# Patient Record
Sex: Female | Born: 1955
Health system: Southern US, Community
[De-identification: ages and names within clinical notes are randomized; demographics above are authoritative.]

## PROBLEM LIST (undated history)

## (undated) DIAGNOSIS — Z658 Other specified problems related to psychosocial circumstances: Secondary | ICD-10-CM

## (undated) DIAGNOSIS — M1990 Unspecified osteoarthritis, unspecified site: Secondary | ICD-10-CM

## (undated) DIAGNOSIS — Z86018 Personal history of other benign neoplasm: Secondary | ICD-10-CM

## (undated) DIAGNOSIS — F419 Anxiety disorder, unspecified: Secondary | ICD-10-CM

## (undated) DIAGNOSIS — E7800 Pure hypercholesterolemia, unspecified: Secondary | ICD-10-CM

## (undated) DIAGNOSIS — U071 COVID-19: Secondary | ICD-10-CM

## (undated) DIAGNOSIS — E669 Obesity, unspecified: Secondary | ICD-10-CM

## (undated) DIAGNOSIS — IMO0001 Reserved for inherently not codable concepts without codable children: Secondary | ICD-10-CM

## (undated) DIAGNOSIS — R030 Elevated blood-pressure reading, without diagnosis of hypertension: Secondary | ICD-10-CM

## (undated) DIAGNOSIS — G4733 Obstructive sleep apnea (adult) (pediatric): Secondary | ICD-10-CM

## (undated) DIAGNOSIS — R600 Localized edema: Secondary | ICD-10-CM

## (undated) DIAGNOSIS — F329 Major depressive disorder, single episode, unspecified: Secondary | ICD-10-CM

## (undated) DIAGNOSIS — R4589 Other symptoms and signs involving emotional state: Secondary | ICD-10-CM

## (undated) DIAGNOSIS — I10 Essential (primary) hypertension: Secondary | ICD-10-CM

## (undated) DIAGNOSIS — M2550 Pain in unspecified joint: Secondary | ICD-10-CM

## (undated) HISTORY — PX: COLONOSCOPY: SHX174

## (undated) HISTORY — DX: Anxiety disorder, unspecified: F41.9

## (undated) HISTORY — DX: Other symptoms and signs involving emotional state: R45.89

## (undated) HISTORY — DX: Personal history of other benign neoplasm: Z86.018

## (undated) HISTORY — DX: Pure hypercholesterolemia, unspecified: E78.00

## (undated) HISTORY — DX: COVID-19: U07.1

## (undated) HISTORY — PX: BELPHAROPTOSIS REPAIR: SHX369

## (undated) HISTORY — DX: Localized edema: R60.0

## (undated) HISTORY — DX: Major depressive disorder, single episode, unspecified: F32.9

## (undated) HISTORY — DX: Pain in unspecified joint: M25.50

## (undated) HISTORY — DX: Reserved for inherently not codable concepts without codable children: IMO0001

## (undated) HISTORY — DX: Other specified problems related to psychosocial circumstances: Z65.8

## (undated) HISTORY — DX: Obesity, unspecified: E66.9

## (undated) HISTORY — DX: Essential (primary) hypertension: I10

## (undated) HISTORY — DX: Elevated blood-pressure reading, without diagnosis of hypertension: R03.0

## (undated) HISTORY — DX: Obstructive sleep apnea (adult) (pediatric): G47.33

---

## 1999-12-17 ENCOUNTER — Other Ambulatory Visit: Admission: RE | Admit: 1999-12-17 | Discharge: 1999-12-17 | Payer: Self-pay | Admitting: *Deleted

## 2000-04-14 ENCOUNTER — Other Ambulatory Visit: Admission: RE | Admit: 2000-04-14 | Discharge: 2000-04-14 | Payer: Self-pay | Admitting: *Deleted

## 2000-11-29 ENCOUNTER — Other Ambulatory Visit: Admission: RE | Admit: 2000-11-29 | Discharge: 2000-11-29 | Payer: Self-pay | Admitting: *Deleted

## 2001-09-21 DIAGNOSIS — F32A Depression, unspecified: Secondary | ICD-10-CM

## 2001-09-21 HISTORY — DX: Depression, unspecified: F32.A

## 2001-09-28 ENCOUNTER — Other Ambulatory Visit: Admission: RE | Admit: 2001-09-28 | Discharge: 2001-09-28 | Payer: Self-pay | Admitting: *Deleted

## 2002-11-20 ENCOUNTER — Other Ambulatory Visit: Admission: RE | Admit: 2002-11-20 | Discharge: 2002-11-20 | Payer: Self-pay | Admitting: *Deleted

## 2002-11-20 DIAGNOSIS — Z86018 Personal history of other benign neoplasm: Secondary | ICD-10-CM

## 2002-11-20 HISTORY — DX: Personal history of other benign neoplasm: Z86.018

## 2003-11-19 ENCOUNTER — Other Ambulatory Visit: Admission: RE | Admit: 2003-11-19 | Discharge: 2003-11-19 | Payer: Self-pay | Admitting: *Deleted

## 2005-02-04 ENCOUNTER — Other Ambulatory Visit: Admission: RE | Admit: 2005-02-04 | Discharge: 2005-02-04 | Payer: Self-pay | Admitting: *Deleted

## 2006-04-13 ENCOUNTER — Other Ambulatory Visit: Admission: RE | Admit: 2006-04-13 | Discharge: 2006-04-13 | Payer: Self-pay | Admitting: Obstetrics & Gynecology

## 2007-05-06 ENCOUNTER — Other Ambulatory Visit: Admission: RE | Admit: 2007-05-06 | Discharge: 2007-05-06 | Payer: Self-pay | Admitting: Obstetrics & Gynecology

## 2008-05-18 ENCOUNTER — Other Ambulatory Visit: Admission: RE | Admit: 2008-05-18 | Discharge: 2008-05-18 | Payer: Self-pay | Admitting: Obstetrics and Gynecology

## 2009-06-03 ENCOUNTER — Encounter: Admission: RE | Admit: 2009-06-03 | Discharge: 2009-06-03 | Payer: Self-pay | Admitting: Obstetrics & Gynecology

## 2010-10-11 ENCOUNTER — Encounter: Payer: Self-pay | Admitting: Obstetrics & Gynecology

## 2011-09-23 ENCOUNTER — Encounter: Payer: Self-pay | Admitting: Internal Medicine

## 2011-09-29 ENCOUNTER — Other Ambulatory Visit: Payer: Self-pay | Admitting: Obstetrics & Gynecology

## 2011-09-29 DIAGNOSIS — Z1231 Encounter for screening mammogram for malignant neoplasm of breast: Secondary | ICD-10-CM

## 2011-10-13 ENCOUNTER — Ambulatory Visit: Payer: Self-pay

## 2011-10-19 ENCOUNTER — Other Ambulatory Visit: Payer: Self-pay | Admitting: Internal Medicine

## 2012-06-21 ENCOUNTER — Ambulatory Visit: Payer: Self-pay | Admitting: Licensed Clinical Social Worker

## 2012-09-21 DIAGNOSIS — E7800 Pure hypercholesterolemia, unspecified: Secondary | ICD-10-CM

## 2012-09-21 HISTORY — DX: Pure hypercholesterolemia, unspecified: E78.00

## 2013-07-11 ENCOUNTER — Other Ambulatory Visit: Payer: Self-pay | Admitting: *Deleted

## 2013-07-11 NOTE — Telephone Encounter (Signed)
Pt asking for Rx for Phentermine 37.5mg  had her Annual Exam 10/19/12 didn't see that this medication was prescribed. Please advise

## 2013-07-12 NOTE — Telephone Encounter (Signed)
Called Pharmacy [facility]) verbally declining Rx  Phentermine 37.5mg . Pt needs to schedule a consult appt To discuss.

## 2013-07-12 NOTE — Telephone Encounter (Signed)
Hi Carol,  I have not prescribed this medication for the patient.  Rx declined.  Please set her up for a consultation if she would like to discuss this further.  Thanks!

## 2013-11-27 ENCOUNTER — Ambulatory Visit (INDEPENDENT_AMBULATORY_CARE_PROVIDER_SITE_OTHER): Payer: BC Managed Care – PPO | Admitting: Gynecology

## 2013-11-27 ENCOUNTER — Encounter: Payer: Self-pay | Admitting: Gynecology

## 2013-11-27 ENCOUNTER — Encounter: Payer: Self-pay | Admitting: Obstetrics and Gynecology

## 2013-11-27 ENCOUNTER — Telehealth: Payer: Self-pay | Admitting: Obstetrics & Gynecology

## 2013-11-27 VITALS — BP 148/98 | HR 82 | Temp 98.4°F | Resp 18 | Ht 63.5 in | Wt 197.0 lb

## 2013-11-27 DIAGNOSIS — N949 Unspecified condition associated with female genital organs and menstrual cycle: Secondary | ICD-10-CM

## 2013-11-27 DIAGNOSIS — F4321 Adjustment disorder with depressed mood: Secondary | ICD-10-CM

## 2013-11-27 DIAGNOSIS — D259 Leiomyoma of uterus, unspecified: Secondary | ICD-10-CM

## 2013-11-27 DIAGNOSIS — D219 Benign neoplasm of connective and other soft tissue, unspecified: Secondary | ICD-10-CM

## 2013-11-27 DIAGNOSIS — F32A Depression, unspecified: Secondary | ICD-10-CM | POA: Insufficient documentation

## 2013-11-27 DIAGNOSIS — R102 Pelvic and perineal pain: Secondary | ICD-10-CM

## 2013-11-27 LAB — POCT URINALYSIS DIPSTICK
Bilirubin, UA: NEGATIVE
GLUCOSE UA: NEGATIVE
Ketones, UA: NEGATIVE
LEUKOCYTES UA: NEGATIVE
Nitrite, UA: NEGATIVE
PROTEIN UA: NEGATIVE
UROBILINOGEN UA: NEGATIVE
pH, UA: 6

## 2013-11-27 MED ORDER — CIPROFLOXACIN HCL 500 MG PO TABS: 500.0000 mg | ORAL_TABLET | Freq: Two times a day (BID) | ORAL | Status: DC

## 2013-11-27 NOTE — Telephone Encounter (Signed)
Patient is having dizzy spells and a lot of pain in her right side. Said she just feels like something isnt right. isnt sure if it is a bad uti or is something else is going on.

## 2013-11-27 NOTE — Progress Notes (Signed)
Subjective:     Patient ID: Beth Rivers, female   DOB: [birth year], 57 y.o.   MRN: [id number]  HPI Comments: Pt reports 2w history of left hip pain that is felt when she lays on it to sleep, not flet during the day.  5d ago she developed an acute right sided pain, worse with cough and sneeze or bending. Pt took some motrin.  Last night she developed dizziness, cold sweats and nausea, she slept for 2.5h.  Pt reports malodorous urine and urgency with incontinence.  + nocturia, 1-2x/night.  No PMB or hot flashes. 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    Pelvic Pain [name] primary symptoms include pelvic pain. Associated symptoms include chills, diarrhea (last few weeks), a fever (questionable.+chills), frequency, nausea and urgency. Pertinent negatives include no constipation.     Review of Systems  Constitutional: Positive for fever (questionable.+chills) and chills.  Gastrointestinal: Positive for nausea and diarrhea (last few weeks). Negative for constipation and blood in stool.  Genitourinary: Positive for urgency, frequency and pelvic pain.       Objective:   Physical Exam  Nursing note and vitals reviewed. Constitutional: She appears well-developed and well-nourished.  Abdominal: Soft. There is no tenderness. There is no rebound and no guarding.   Pelvic: External genitalia:  no lesions              Urethra:  normal appearing urethra with no masses, tenderness or lesions              Bartholins and Skenes: normal                 Vagina: normal appearing vagina with normal color and discharge, no lesions              Cervix: normal appearance                     Bimanual Exam:  Uterus:  uterus is limited by habitus, left sided tenderness                                      Adnexa:  no masses No CVAT                                           Assessment:     Hematuria Fibroid uterus Pelvic pain  Elevated BP    Plan:     Urine culture PUS Ddx  includes kidney stone, muscular skeletal First elevated BP, will recheck at PUS, recommend increase sleep, stress reduction

## 2013-11-27 NOTE — Patient Instructions (Addendum)
Pelvic Pain, Female Female pelvic pain can be caused by many different things and start from a variety of places. Pelvic pain refers to pain that is located in the lower half of the abdomen and between your hips. The pain may occur over a short period of time (acute) or may be reoccurring (chronic). The cause of pelvic pain may be related to disorders affecting the female reproductive organs (gynecologic), but it may also be related to the bladder, kidney stones, an intestinal complication, or muscle or skeletal problems. Getting help right away for pelvic pain is important, especially if there has been severe, sharp, or a sudden onset of unusual pain. It is also important to get help right away because some types of pelvic pain can be life threatening.  CAUSES  Below are only some of the causes of pelvic pain. The causes of pelvic pain can be in one of several categories.   Gynecologic.  Pelvic inflammatory disease.  Sexually transmitted infection.  Ovarian cyst or a twisted ovarian ligament (ovarian torsion).  Uterine lining that grows outside the uterus (endometriosis).  Fibroids, cysts, or tumors.  Ovulation.  Pregnancy.  Pregnancy that occurs outside the uterus (ectopic pregnancy).  Miscarriage.  Labor.  Abruption of the placenta or ruptured uterus.  Infection.  Uterine infection (endometritis).  Bladder infection.  Diverticulitis.  Miscarriage related to a uterine infection (septic abortion).  Bladder.  Inflammation of the bladder (cystitis).  Kidney stone(s).  Gastrointenstinal.  Constipation.  Diverticulitis.  Neurologic.  Trauma.  Feeling pelvic pain because of mental or emotional causes (psychosomatic).  Cancers of the bowel or pelvis. EVALUATION  Your caregiver will want to take a careful history of your concerns. This includes recent changes in your health, a careful gynecologic history of your periods (menses), and a sexual history. Obtaining  your family history and medical history is also important. Your caregiver may suggest a pelvic exam. A pelvic exam will help identify the location and severity of the pain. It also helps in the evaluation of which organ system may be involved. In order to identify the cause of the pelvic pain and be properly treated, your caregiver may order tests. These tests may include:   A pregnancy test.  Pelvic ultrasonography.  An X-ray exam of the abdomen.  A urinalysis or evaluation of vaginal discharge.  Blood tests. HOME CARE INSTRUCTIONS   Only take over-the-counter or prescription medicines for pain, discomfort, or fever as directed by your caregiver.   Rest as directed by your caregiver.   Eat a balanced diet.   Drink enough fluids to make your urine clear or pale yellow, or as directed.   Avoid sexual intercourse if it causes pain.   Apply warm or cold compresses to the lower abdomen depending on which one helps the pain.   Avoid stressful situations.   Keep a journal of your pelvic pain. Write down when it started, where the pain is located, and if there are things that seem to be associated with the pain, such as food or your menstrual cycle.  Follow up with your caregiver as directed.  SEEK MEDICAL CARE IF:  Your medicine does not help your pain.  You have abnormal vaginal discharge. SEEK IMMEDIATE MEDICAL CARE IF:   You have heavy bleeding from the vagina.   Your pelvic pain increases.   You feel lightheaded or faint.   You have chills.   You have pain with urination or blood in your urine.   You have uncontrolled  diarrhea or vomiting.   You have a fever or persistent symptoms for more than 3 days.  You have a fever and your symptoms suddenly get worse.   You are being physically or sexually abused.  MAKE SURE YOU:  Understand these instructions.  Will watch your condition.  Will get help if you are not doing well or get worse. Document  Released: 08/04/2004 Document Revised: 03/08/2012 Document Reviewed: 12/28/2011 [facility] Patient Information 2014 [place], [place]. Stress Stress-related medical problems are becoming increasingly common. The body has a built-in physical response to stressful situations. Faced with pressure, challenge or danger, we need to react quickly. Our bodies release hormones such as cortisol and adrenaline to help do this. These hormones are part of the "fight or flight" response and affect the metabolic rate, heart rate and blood pressure, resulting in a heightened, stressed state that prepares the body for optimum performance in dealing with a stressful situation. It is likely that early man required these mechanisms to stay alive, but usually modern stresses do not call for this, and the same hormones released in today's world can damage health and reduce coping ability. CAUSES  Pressure to perform at work, at school or in sports.  Threats of physical violence.  Money worries.  Arguments.  Family conflicts.  Divorce or separation from significant other.  Bereavement.  New job or unemployment.  Changes in location.  Alcohol or drug abuse. SOMETIMES, THERE IS NO PARTICULAR REASON FOR DEVELOPING STRESS. Almost all people are at risk of being stressed at some time in their lives. It is important to know that some stress is temporary and some is long term.  Temporary stress will go away when a situation is resolved. Most people can cope with short periods of stress, and it can often be relieved by relaxing, taking a walk, chatting through issues with friends, or having a good night's sleep.  Chronic (long-term, continuous) stress is much harder to deal with. It can be psychologically and emotionally damaging. It can be harmful both for an individual and for friends and family. SYMPTOMS Everyone reacts to stress differently. There are some common effects that help [place] recognize it. In times of  extreme stress, people may:  Shake uncontrollably.  Breathe faster and deeper than normal (hyperventilate).  Vomit.  For people with asthma, stress can trigger an attack.  For some people, stress may trigger migraine headaches, ulcers, and body pain. PHYSICAL EFFECTS OF STRESS MAY INCLUDE:  Loss of energy.  Skin problems.  Aches and pains resulting from tense muscles, including neck ache, backache and tension headaches.  Increased pain from arthritis and other conditions.  Irregular heart beat (palpitations).  Periods of irritability or anger.  Apathy or depression.  Anxiety (feeling uptight or worrying).  Unusual behavior.  Loss of appetite.  Comfort eating.  Lack of concentration.  Loss of, or decreased, sex-drive.  Increased smoking, drinking, or recreational drug use.  For women, missed periods.  Ulcers, joint pain, and muscle pain. Post-traumatic stress is the stress caused by any serious accident, strong emotional damage, or extremely difficult or violent experience such as rape or war. Post-traumatic stress victims can experience mixtures of emotions such as fear, shame, depression, guilt or anger. It may include recurrent memories or images that may be haunting. These feelings can last for weeks, months or even years after the traumatic event that triggered them. Specialized treatment, possibly with medicines and psychological therapies, is available. If stress is causing physical symptoms, severe distress or  making it difficult for you to function as normal, it is worth seeing your caregiver. It is important to remember that although stress is a usual part of life, extreme or prolonged stress can lead to other illnesses that will need treatment. It is better to visit a doctor sooner rather than later. Stress has been linked to the development of high blood pressure and heart disease, as well as insomnia and depression. There is no diagnostic test for stress  since everyone reacts to it differently. But a caregiver will be able to spot the physical symptoms, such as:  Headaches.  Shingles.  Ulcers. Emotional distress such as intense worry, low mood or irritability should be detected when the doctor asks pertinent questions to identify any underlying problems that might be the cause. In case there are physical reasons for the symptoms, the doctor may also want to do some tests to exclude certain conditions. If you feel that you are suffering from stress, try to identify the aspects of your life that are causing it. Sometimes you may not be able to change or avoid them, but even a small change can have a positive ripple effect. A simple lifestyle change can make all the difference. STRATEGIES THAT CAN HELP DEAL WITH STRESS:  Delegating or sharing responsibilities.  Avoiding confrontations.  Learning to be more assertive.  Regular exercise.  Avoid using alcohol or street drugs to cope.  Eating a healthy, balanced diet, rich in fruit and vegetables and proteins.  Finding humor or absurdity in stressful situations.  Never taking on more than you know you can handle comfortably.  Organizing your time better to get as much done as possible.  Talking to friends or family and sharing your thoughts and fears.  Listening to music or relaxation tapes.  Tensing and then relaxing your muscles, starting at the toes and working up to the head and neck. If you think that you would benefit from help, either in identifying the things that are causing your stress or in learning techniques to help you relax, see a caregiver who is capable of helping you with this. Rather than relying on medications, it is usually better to try and identify the things in your life that are causing stress and try to deal with them. There are many techniques of managing stress including counseling, psychotherapy, aromatherapy, yoga, and exercise. Your caregiver can help you  determine what is best for you. Document Released: 11/28/2002 Document Revised: 11/30/2011 Document Reviewed: 10/25/2007 [facility] Patient Information 2014 [place], [place].

## 2013-11-27 NOTE — Telephone Encounter (Signed)
Spoke with pt who has been having a sharp pain in her right side for several days. Pt had a dizzy spell yesterday that worried her. She layed down afterwards and napped for 2 hours. Pt thought she might  be dehydrated so she drank lots of water and had a smoothie and felt better. Pt has noticed smelly urine and urgency to get to the bathroom. Pt sometimes has no warning she needs to urinate and will almost wet herself before she gets to the toilet. Pt did have fever yesterday that broke on its own. No vomiting, but pt was a little nauseated with the dizzy spell. The pain in her side comes and goes and is worse when she coughs or bends over. It hurts to lay on her left side, like a knife stabbing, although pain is on right side. Pt fine with seeing TL today at 6:30 pm.

## 2013-11-29 LAB — URINE CULTURE
COLONY COUNT: NO GROWTH
ORGANISM ID, BACTERIA: NO GROWTH

## 2013-11-30 ENCOUNTER — Telehealth: Payer: Self-pay | Admitting: Obstetrics & Gynecology

## 2013-11-30 DIAGNOSIS — Z1211 Encounter for screening for malignant neoplasm of colon: Secondary | ICD-10-CM

## 2013-11-30 NOTE — Telephone Encounter (Signed)
Patient calling for recent results and also has some questions for nurse. Patient was not in a place where she had privacy to talk so no other details were given.

## 2013-11-30 NOTE — Telephone Encounter (Signed)
Spoke with pt who wondered if her urine culture results were back yet. Advised pt that the report showed no growth of bacteria. Pt was given Cipro at her appt and reports she is feeling much better and urinary leakage has resolved. Pt will finish the med as she feels it really helped her. Pt is surprised that the culture is negative, given her symptoms, and is a little concerned what could have caused her to feel so bad. Pt has been resting and staying hydrated. Pt has appt for PUS on 12-05-13 at 1:30 with TL.

## 2013-12-05 ENCOUNTER — Encounter: Payer: Self-pay | Admitting: Gynecology

## 2013-12-05 ENCOUNTER — Ambulatory Visit (INDEPENDENT_AMBULATORY_CARE_PROVIDER_SITE_OTHER): Payer: BC Managed Care – PPO | Admitting: Gynecology

## 2013-12-05 ENCOUNTER — Ambulatory Visit (INDEPENDENT_AMBULATORY_CARE_PROVIDER_SITE_OTHER): Payer: BC Managed Care – PPO

## 2013-12-05 VITALS — BP 124/72 | HR 80 | Resp 16 | Ht 63.5 in | Wt 201.0 lb

## 2013-12-05 DIAGNOSIS — D259 Leiomyoma of uterus, unspecified: Secondary | ICD-10-CM

## 2013-12-05 DIAGNOSIS — N949 Unspecified condition associated with female genital organs and menstrual cycle: Secondary | ICD-10-CM

## 2013-12-05 DIAGNOSIS — D219 Benign neoplasm of connective and other soft tissue, unspecified: Secondary | ICD-10-CM

## 2013-12-05 DIAGNOSIS — R102 Pelvic and perineal pain: Secondary | ICD-10-CM

## 2013-12-05 DIAGNOSIS — N952 Postmenopausal atrophic vaginitis: Secondary | ICD-10-CM

## 2013-12-05 MED ORDER — ESTRADIOL 10 MCG VA TABS: 1.0000 | ORAL_TABLET | Freq: Every day | VAGINAL | Status: DC

## 2013-12-05 NOTE — Telephone Encounter (Signed)
Patient is asking for a referral to have a colonoscopy

## 2013-12-05 NOTE — Progress Notes (Signed)
      Pt here with husband for PUS done for LLQ/hip pain with fibroids. U/S images reviewed.  Fibroids are smaller than in prior scan.  EMS is thin 2.98. Ovaries are c/w menopause. No FF in CDS> Pt reports that her dysuria and incontinence has resolved despite urine culture not growing anything.  Her hip pain is better but she states that she feels like it may have moved down. Pt denies recent sexual activity. U/A with tr blood.  BP 124/72  Pulse 80  Resp 16  Ht 5' 3.5" (1.613 m)  Wt 201 lb (91.173 kg)  BMI 35.04 kg/m2 General appearance: alert, cooperative and appears stated age Abdomen: obese, soft, no rebound/guarding.  tenderness in left lower quadrant exacerbated with lifting head Pelvic: cervix normal in appearance, external genitalia normal, no adnexal masses or tenderness, no cervical motion tenderness, uterus normal size, shape, and consistency, vagina normal without discharge and vagina pale, smooth, some petechiae anterior wall, left lateral wal tenderness exacerbated with lifting head Radiates to inner left thigh. RVE: reproduced but less intense.  Assessment/Plan Fibroids-improved Pelvic floor dysfunction-refer to PT.  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  60m spent discussing vaginal atrophy and treatment, pelvic pain  >50% face to face

## 2013-12-05 NOTE — Telephone Encounter (Signed)
Patient was seen today. Requesting referral for colonoscopy.

## 2013-12-06 ENCOUNTER — Telehealth: Payer: Self-pay | Admitting: Gynecology

## 2013-12-06 NOTE — Telephone Encounter (Signed)
Dr. Charlies Constable, can you review and advise? Patient requesting referral for colonoscopy.

## 2013-12-06 NOTE — Telephone Encounter (Signed)
Per fax received from [facility] with [facility], patient is scheduled 04.02.2015 @ 1500.

## 2013-12-07 NOTE — Telephone Encounter (Signed)
[place] for Dr Collene Mares

## 2013-12-08 NOTE — Telephone Encounter (Signed)
Spoke with patient. Patient advised of referral sent for colonoscopy. Instructed that our office or Dr.Mann's office would be in contact to schedule an appointment. Patient verbalizes understanding and is agreeable.   Routing to provider for final review. Patient agreeable to disposition. Will close encounter

## 2013-12-11 ENCOUNTER — Encounter: Payer: Self-pay | Admitting: Gynecology

## 2013-12-11 ENCOUNTER — Telehealth: Payer: Self-pay | Admitting: Obstetrics & Gynecology

## 2013-12-11 NOTE — Telephone Encounter (Signed)
Per fax received from Dr Youlanda Mighty office. Patient is scheduled 04.14.2015 @ 1400. Patient has been advised

## 2014-01-04 ENCOUNTER — Ambulatory Visit: Payer: Self-pay | Admitting: Obstetrics & Gynecology

## 2014-01-08 ENCOUNTER — Ambulatory Visit: Payer: Self-pay | Admitting: Gynecology

## 2014-01-12 ENCOUNTER — Ambulatory Visit: Payer: BC Managed Care – PPO | Admitting: Gynecology

## 2014-01-15 ENCOUNTER — Ambulatory Visit: Payer: BC Managed Care – PPO | Admitting: Gynecology

## 2014-01-15 ENCOUNTER — Telehealth: Payer: Self-pay | Admitting: Gynecology

## 2014-01-15 ENCOUNTER — Encounter: Payer: Self-pay | Admitting: Gynecology

## 2014-01-15 NOTE — Telephone Encounter (Signed)
Pt came in at 3:30 for her 3:00 aex appt. Rescheduled to 01/22/14.

## 2014-01-22 ENCOUNTER — Ambulatory Visit (INDEPENDENT_AMBULATORY_CARE_PROVIDER_SITE_OTHER): Payer: BC Managed Care – PPO | Admitting: Gynecology

## 2014-01-22 ENCOUNTER — Encounter: Payer: Self-pay | Admitting: *Deleted

## 2014-01-22 VITALS — BP 128/78 | HR 92 | Resp 18 | Ht 63.5 in | Wt 198.0 lb

## 2014-01-22 DIAGNOSIS — Z1211 Encounter for screening for malignant neoplasm of colon: Secondary | ICD-10-CM

## 2014-01-22 DIAGNOSIS — Z713 Dietary counseling and surveillance: Secondary | ICD-10-CM

## 2014-01-22 DIAGNOSIS — Z01419 Encounter for gynecological examination (general) (routine) without abnormal findings: Secondary | ICD-10-CM

## 2014-01-22 DIAGNOSIS — Z0000 Encounter for general adult medical examination without abnormal findings: Secondary | ICD-10-CM

## 2014-01-22 LAB — POCT URINALYSIS DIPSTICK
BILIRUBIN UA: NEGATIVE
Glucose, UA: NEGATIVE
Ketones, UA: NEGATIVE
Leukocytes, UA: NEGATIVE
NITRITE UA: NEGATIVE
PH UA: 5
Protein, UA: NEGATIVE
Urobilinogen, UA: NEGATIVE

## 2014-01-22 NOTE — Patient Instructions (Signed)

## 2014-01-22 NOTE — Progress Notes (Signed)
58 y.o. Married Caucasian female   G2P2002 here for annual exam.  She does not report hot flashes, does not have night sweats, does not have vaginal dryness.  She is not using lubricants.  She does not report post-menopasual bleeding.  Pt stopped the vagifem due to fear of cancer.  Pt is overall feeling better.  Pt is eager to start a better diet and exercise.  Pt states that after the loss of herduaghter she was so depressed that she could not take care of herself.  Pt states that she has turned the corner and was interested in weight loss advice.  Pt has been on numerous diets in the past including liquid and marked reduced caloric.  She also has used phentiramine.    No LMP recorded. Patient is postmenopausal.          Sexually active: yes  The current method of family planning is vasectomy.    Exercising: yes  Walking Last pap: 09/2012 neg. Abnormal PAP: yes Mammogram: 05/2009 BI-RADS 1: Neg BSE: No Colonoscopy: Never DEXA: Never Alcohol: Yes, 2 weekly Tobacco: No  Health Maintenance  Topic Date Due  . Colonoscopy  01/14/2007  . Mammogram  06/04/2011  . Influenza Vaccine  04/21/2014  . Pap Smear  10/20/2015  . Tetanus/tdap  05/30/2019    Family History  Problem Relation Age of Onset  . Colon cancer Maternal Grandmother   . Hypertension Mother   . Hypertension Father   . Heart attack Father     Patient Active Problem List   Diagnosis Date Noted  . Situational depression 11/27/2013  . Fibroids 11/27/2013    Past Medical History  Diagnosis Date  . Depression 09/2001  . History of uterine fibroid 11/2002  . Elevated LDL cholesterol level 1/14  . Elevated BP     History reviewed. No pertinent past surgical history.  Allergies: Review of patient's allergies indicates no known allergies.  Current Outpatient Prescriptions  Medication Sig Dispense Refill  . Estradiol (VAGIFEM) 10 MCG TABS vaginal tablet Place 1 tablet (10 mcg total) vaginally at bedtime. Take nightly for  2w then twice a week thereafter  18 tablet  0   No current facility-administered medications for this visit.    ROS: Pertinent items are noted in HPI.  Exam:    BP 128/78  Pulse 92  Resp 18  Ht 5' 3.5" (1.613 m)  Wt 198 lb (89.812 kg)  BMI 34.52 kg/m2 Weight change: @WEIGHTCHANGE @ Last 3 height recordings:  Ht Readings from Last 3 Encounters:  01/22/14 5' 3.5" (1.613 m)  12/05/13 5' 3.5" (1.613 m)  11/27/13 5' 3.5" (1.613 m)   General appearance: alert, cooperative and appears stated age Head: Normocephalic, without obvious abnormality, atraumatic Neck: no adenopathy, no carotid bruit, no JVD, supple, symmetrical, trachea midline and thyroid not enlarged, symmetric, no tenderness/mass/nodules Lungs: clear to auscultation bilaterally Breasts: normal appearance, no masses or tenderness Heart: regular rate and rhythm, S1, S2 normal, no murmur, click, rub or gallop Abdomen: soft, non-tender; bowel sounds normal; no masses,  no organomegaly Extremities: extremities normal, atraumatic, no cyanosis or edema Skin: Skin color, texture, turgor normal. No rashes or lesions Lymph nodes: Cervical, supraclavicular, and axillary nodes normal. no inguinal nodes palpated Neurologic: Grossly normal   Pelvic: External genitalia:  no lesions              Urethra: normal appearing urethra with no masses, tenderness or lesions  Bartholins and Skenes: normal                 Vagina: normal appearing vagina with normal color and discharge, no lesions              Cervix: normal appearance              Pap taken: yes        Bimanual Exam:  Uterus:  uterus is normal size, shape, consistency and nontender                                      Adnexa:    normal adnexa in size, nontender and no masses                                      Rectovaginal: Confirms                                      Anus:  normal sphincter tone, no lesions  1. Routine gynecological examination   mammogram-overdue!! counseled on breast self exam, mammography screening, menopause, adequate intake of calcium and vitamin D, diet and exercise return annually or prn Discussed PAP guideline changes, importance of weight bearing exercises, calcium, vit D and balanced diet.  2. Laboratory examination ordered as part of a routine general medical examination  - POCT Urinalysis Dipstick - Hemoglobin, fingerstick  3. Colon cancer screening  - Ambulatory referral to Gastroenterology  4. Dietary counseling Spent a long time reviewing the latest FDA food guidelines.  We discussed the affects of simple and complex carbohydrates, as well as protein on insulin and glycemic index.  We discussed the importance of exercise. We discussed the bad effects of starvation diets and prefer to not use medications as side effects can appear later on.  i recommended some books for her to review.  Recommend support groups such as weight watchers as well  5. Morbid obesity See above  6. Dyspareunia Pt will restart vagifem, risks and benefits of vaginal estrogen reviewed.  Pt reports having ample supply.  An After Visit Summary was printed and given to the patient.   Additional 64m spent in counseling on diet and exercise, >50% face to face

## 2014-01-23 LAB — HEMOGLOBIN, FINGERSTICK: Hemoglobin, fingerstick: 15.8 g/dL (ref 12.0–16.0)

## 2014-02-27 ENCOUNTER — Ambulatory Visit: Payer: BC Managed Care – PPO | Admitting: Gynecology

## 2014-07-23 ENCOUNTER — Encounter: Payer: Self-pay | Admitting: *Deleted

## 2014-11-28 ENCOUNTER — Other Ambulatory Visit: Payer: Self-pay

## 2014-11-28 DIAGNOSIS — Z1231 Encounter for screening mammogram for malignant neoplasm of breast: Secondary | ICD-10-CM

## 2014-11-29 ENCOUNTER — Ambulatory Visit
Admission: RE | Admit: 2014-11-29 | Discharge: 2014-11-29 | Disposition: A | Discharge status: Home / Self Care | Payer: BLUE CROSS/BLUE SHIELD | Source: Ambulatory Visit

## 2014-11-29 DIAGNOSIS — Z1231 Encounter for screening mammogram for malignant neoplasm of breast: Secondary | ICD-10-CM

## 2015-01-21 ENCOUNTER — Telehealth: Payer: Self-pay | Admitting: Obstetrics & Gynecology

## 2015-01-21 NOTE — Telephone Encounter (Signed)
Patient calling requesting guidance on blood pressure checks. A friend of hers who is also a physician suggested she check her blood pressure all week for Dr. Sabra Heck to review her readings at her A[facility] 01/24/14.

## 2015-01-21 NOTE — Telephone Encounter (Signed)
Spoke with patient. She states she was with physician friends of hers and they checked her BP and stated that it was high. Patient denies complaints at this time, no visual changes, denies headaches or chest pain.  Patient was wondering if she could come in each day until her appointment and have BP checked by nurse.  Advised patient best to have BP checked after resting for 5-10 minutes and can do BP check each day at home with home monitor and write down numbers and bring with her to appointment. Patient is agreeable to this plan. Routing to provider for final review. Patient agreeable to disposition. Will close encounter

## 2015-01-24 ENCOUNTER — Ambulatory Visit: Payer: BC Managed Care – PPO | Admitting: Obstetrics & Gynecology

## 2015-01-25 ENCOUNTER — Encounter: Payer: Self-pay | Admitting: Obstetrics & Gynecology

## 2015-01-25 ENCOUNTER — Ambulatory Visit (INDEPENDENT_AMBULATORY_CARE_PROVIDER_SITE_OTHER): Payer: BLUE CROSS/BLUE SHIELD | Admitting: Obstetrics & Gynecology

## 2015-01-25 VITALS — BP 138/88 | HR 80 | Resp 16 | Ht 63.0 in | Wt 175.0 lb

## 2015-01-25 DIAGNOSIS — Z01419 Encounter for gynecological examination (general) (routine) without abnormal findings: Secondary | ICD-10-CM | POA: Diagnosis not present

## 2015-01-25 DIAGNOSIS — R109 Unspecified abdominal pain: Secondary | ICD-10-CM | POA: Diagnosis not present

## 2015-01-25 DIAGNOSIS — Z124 Encounter for screening for malignant neoplasm of cervix: Secondary | ICD-10-CM | POA: Diagnosis not present

## 2015-01-25 DIAGNOSIS — Z0000 Encounter for general adult medical examination without abnormal findings: Secondary | ICD-10-CM

## 2015-01-25 DIAGNOSIS — Z1211 Encounter for screening for malignant neoplasm of colon: Secondary | ICD-10-CM

## 2015-01-25 LAB — COMPREHENSIVE METABOLIC PANEL
ALT: 17 U/L (ref 0–35)
AST: 15 U/L (ref 0–37)
Albumin: 3.7 g/dL (ref 3.5–5.2)
Alkaline Phosphatase: 75 U/L (ref 39–117)
BUN: 16 mg/dL (ref 6–23)
CO2: 27 meq/L (ref 19–32)
CREATININE: 0.62 mg/dL (ref 0.50–1.10)
Calcium: 8.6 mg/dL (ref 8.4–10.5)
Chloride: 104 mEq/L (ref 96–112)
Glucose, Bld: 78 mg/dL (ref 70–99)
Potassium: 4.2 mEq/L (ref 3.5–5.3)
Sodium: 140 mEq/L (ref 135–145)
Total Bilirubin: 0.5 mg/dL (ref 0.2–1.2)
Total Protein: 6.5 g/dL (ref 6.0–8.3)

## 2015-01-25 LAB — POCT URINALYSIS DIPSTICK
Bilirubin, UA: NEGATIVE
Glucose, UA: NEGATIVE
Ketones, UA: NEGATIVE
Leukocytes, UA: NEGATIVE
NITRITE UA: NEGATIVE
PROTEIN UA: NEGATIVE
Urobilinogen, UA: NEGATIVE
pH, UA: 7

## 2015-01-25 LAB — LIPID PANEL
CHOL/HDL RATIO: 3.4 ratio
CHOLESTEROL: 201 mg/dL — AB (ref 0–200)
HDL: 59 mg/dL (ref >=46)
LDL Cholesterol: 123 mg/dL — ABNORMAL HIGH (ref 0–99)
Triglycerides: 95 mg/dL (ref <150)
VLDL: 19 mg/dL (ref 0–40)

## 2015-01-25 LAB — TSH: TSH: 1.601 u[IU]/mL (ref 0.350–4.500)

## 2015-01-25 MED ORDER — TRIAMTERENE-HCTZ 50-25 MG PO CAPS: 1.0000 | ORAL_CAPSULE | ORAL | Status: DC

## 2015-01-25 MED ORDER — SULFAMETHOXAZOLE-TRIMETHOPRIM 800-160 MG PO TABS: 1.0000 | ORAL_TABLET | Freq: Two times a day (BID) | ORAL | Status: DC

## 2015-01-25 NOTE — Progress Notes (Signed)
59 y.o. G2P2001 Separated CaucasianF here for annual exam.  Doing well.  Separated by husband.  Dating newly.  Dealing with daughter's death (has been three years).  Taking BPs at home.  Hve been 130-157/73-99.  Watching salt intake.    No vaginal bleeding.  Having some dryness with intercourse.  Wants some recommendations.    Patient's last menstrual period was 09/22/2003.          Sexually active: Yes.    The current method of family planning is post menopausal status.    Exercising: Yes.    walking 5-6 miles daily Smoker:  no  Health Maintenance: Pap:  1/14 normal History of abnormal Pap:  yes MMG:  11/29/14 3D-normal Colonoscopy:  None.  Willing to go this year. BMD:   none TDaP:  05/29/09 Screening Labs: today, Hb today: 15.1, Urine today: PH-7.0, RBC-trace   reports that she has never smoked. She has never used smokeless tobacco. She reports that she drinks about 0.6 oz of alcohol per week. She reports that she does not use illicit drugs.  Past Medical History  Diagnosis Date  . Depression 09/2001  . History of uterine fibroid 11/2002  . Elevated LDL cholesterol level 1/14  . Elevated BP    No past surgical history on file.  Family History  Problem Relation Age of Onset  . Colon cancer Maternal Grandmother   . Hypertension Mother   . Hypertension Father   . Heart attack Father     ROS:  Pertinent items are noted in HPI.  Otherwise, a comprehensive ROS was negative.  Exam:   BP 138/88 mmHg  Pulse 80  Resp 16  Ht 5\' 3"  (1.6 m)  Wt 175 lb (79.379 kg)  BMI 31.01 kg/m2  LMP 09/22/2003  Weight change: -23#  Height: 5\' 3"  (160 cm)  Ht Readings from Last 3 Encounters:  01/25/15 5\' 3"  (1.6 m)  01/22/14 5' 3.5" (1.613 m)  12/05/13 5' 3.5" (1.613 m)    General appearance: alert, cooperative and appears stated age Head: Normocephalic, without obvious abnormality, atraumatic Neck: no adenopathy, supple, symmetrical, trachea midline and thyroid normal to inspection and  palpation Lungs: clear to auscultation bilaterally Breasts: normal appearance, no masses or tenderness Heart: regular rate and rhythm Abdomen: soft, non-tender; bowel sounds normal; no masses,  no organomegaly Extremities: extremities normal, atraumatic, no cyanosis or edema Skin: Skin color, texture, turgor normal. No rashes or lesions Lymph nodes: Cervical, supraclavicular, and axillary nodes normal. No abnormal inguinal nodes palpated Neurologic: Grossly normal   Pelvic: External genitalia:  no lesions              Urethra:  normal appearing urethra with no masses, tenderness or lesions              Bartholins and Skenes: normal                 Vagina: normal appearing vagina with normal color and discharge, no lesions              Cervix: no lesions              Pap taken: Yes.   Bimanual Exam:  Uterus:  normal size, contour, position, consistency, mobility, non-tender              Adnexa: normal adnexa and no mass, fullness, tenderness               Rectovaginal: Confirms  Anus:  normal sphincter tone, no lesions  Chaperone was present for exam.  A:  Well Woman with normal exam PMP, no HRT Grief rx from daughter's death, coping well Low back pain, possible UTI Hypertension Vaginal dryness  P:   Mammogram yearly pap smear  CMP, TSH, Vit D, Lipids Bactrim DS bid x 3 days.  Urine culture pending. Starting HCTZ 25mg  daily.  #30/3RF Topical, non-hormonal options for vaginal dryness discussed return annually or prn

## 2015-01-26 LAB — VITAMIN D 25 HYDROXY (VIT D DEFICIENCY, FRACTURES): Vit D, 25-Hydroxy: 24 ng/mL — ABNORMAL LOW (ref 30–100)

## 2015-01-27 LAB — URINE CULTURE
Colony Count: NO GROWTH
Organism ID, Bacteria: NO GROWTH

## 2015-01-28 ENCOUNTER — Telehealth: Payer: Self-pay

## 2015-01-28 ENCOUNTER — Encounter: Payer: Self-pay | Admitting: Obstetrics & Gynecology

## 2015-01-28 DIAGNOSIS — Z1211 Encounter for screening for malignant neoplasm of colon: Secondary | ICD-10-CM

## 2015-01-28 NOTE — Telephone Encounter (Signed)
Message     Dr. Sabra Heck mentioned that she was going to give me a colonoscopy referred to [facility]. She mentioned that she knew a Doctor there who would be a good fit for me. Can you please let me know his name and if you will make the initial appointment or if I need to do it? Thank you for everything

## 2015-01-28 NOTE — Telephone Encounter (Signed)
Referral placed to [facility] to see Dr.Magod for screening colonoscopy.

## 2015-01-28 NOTE — Telephone Encounter (Signed)
Dr. Watt Climes.  Thanks.

## 2015-01-28 NOTE — Telephone Encounter (Signed)
Patient was last seen on 05.06.2016. Patient sent mychart message regarding referral to [facility]. I will be happy to schedule appointment. Is there a  provider you recommend for this patient?

## 2015-01-29 LAB — IPS PAP TEST WITH HPV

## 2015-01-29 LAB — HEMOGLOBIN, FINGERSTICK: Hemoglobin, fingerstick: 15.1 g/dL (ref 12.0–16.0)

## 2015-01-29 NOTE — Telephone Encounter (Signed)
Spoke with Reuben Likes at [facility]. She will fax referral form to be filled out and sent back to office for patient at this time.

## 2015-01-31 NOTE — Telephone Encounter (Signed)
Referral to Dr.Magod faxed on 01/30/2015 with cover sheet and confirmation. Included referral form, OV notes, labs, demographics, and insurance. Patient was seen with their office this morning and will hopefully be setting up colonoscopy for next week per patient. Would like Dr.Miller to know her blood pressure went down to 117/80. Advised will let Dr.Miller know. Patient will return call if she needs anything.  Routing to provider for final review. Patient agreeable to disposition. Patient aware provider will review message and nurse will return call with any additional instructions or change of disposition. Will close encounter.

## 2015-02-19 ENCOUNTER — Ambulatory Visit: Payer: Self-pay | Admitting: Nurse Practitioner

## 2015-04-29 ENCOUNTER — Ambulatory Visit (INDEPENDENT_AMBULATORY_CARE_PROVIDER_SITE_OTHER): Payer: BLUE CROSS/BLUE SHIELD | Admitting: Obstetrics & Gynecology

## 2015-04-29 VITALS — BP 124/78 | HR 60 | Resp 16 | Ht 63.0 in | Wt 185.0 lb

## 2015-04-29 DIAGNOSIS — R030 Elevated blood-pressure reading, without diagnosis of hypertension: Secondary | ICD-10-CM | POA: Diagnosis not present

## 2015-04-29 NOTE — Progress Notes (Signed)
59 y.o. Separated G2P2 WF here for follow-up.  Patient has been walking 4 miles daily.  She has increased water intake and is watching salt intake.  She stopped taking her HCTZ a few weeks ago.  BPs have been normal.  Checking three for four times weekly at home.  Wants to know if that was "okay".  Really doesn't want to be on BP medication.  Have encouraged her to continue to check at home. Aware values above which she should call back.     Doing well, otherwise.  Still seeing significant other in [place] about once a month.  Seeing therapist as well.  O: Healthy WD,WN female Affect: normal  A: Hypertension, improved with exercise and dietary changes  P:  Pt has stopped blood pressure medication.  She will continue to check BPs at home.  Knows to call if this changes, she needs to call back for follow up.

## 2015-04-30 ENCOUNTER — Encounter: Payer: Self-pay | Admitting: Obstetrics & Gynecology

## 2015-04-30 DIAGNOSIS — I10 Essential (primary) hypertension: Secondary | ICD-10-CM | POA: Insufficient documentation

## 2015-10-08 ENCOUNTER — Telehealth: Payer: Self-pay | Admitting: Obstetrics & Gynecology

## 2015-10-08 NOTE — Telephone Encounter (Signed)
Pt called asking for a change in her antihypertensive medication dosage.  Last time pt was seen, she's stopped her medication altogether.  She was also not living in the state the last time she was seen.  I think she needs to be seen locally and by a PCP for medication change, especially since she wasn't on the medication the last time she was seen.

## 2015-10-08 NOTE — Telephone Encounter (Signed)
Patient is requesting her Triam/HCTZ dosage be changed to 37.5mg   due to insurance. Please advise

## 2015-10-08 NOTE — Telephone Encounter (Signed)
Patient is requesting a medication dosage  Change for Triam terence- hydo chlorothiazide. She has new insurance and would like to switch to the 37.5 mg instead Patient is using [facility] at [phone].

## 2015-10-09 NOTE — Telephone Encounter (Signed)
I spoke with patient and let her know Dr. Ammie Ferrier recommendations. She is requesting that Dr. Sabra Heck give her the name of a good PCP.

## 2015-10-10 NOTE — Telephone Encounter (Signed)
If she is living locally, please share the names of providers that are taking new patients.  Sending this to triage to call.

## 2015-10-11 NOTE — Telephone Encounter (Signed)
Message left to return call to Beth Rivers at [phone].    

## 2015-10-11 NOTE — Telephone Encounter (Signed)
Patient returned call. She is given numbers to [facility] and will call to schedule.  Patient requests that Dr. Sabra Heck place one month of medication while she waits to get in to new PCP. She states that what Dr. Sabra Heck had previously sent in for her is not covered by pharmacy insurance but the lowered dose is. Patient request short term fill of Dyazide 37.5-25 mg capsules to [facility].  She states that she apologizes for any inconvenience and realizes she really needs to get in with Primary care and will do so, just needs to have the lower cost and lower dose medication until she can establish care, she just cannot afford what Dr. Sabra Heck has previously sent in for her. She denies any complaints at this time, has been monitoring BP at home. No headaches or visual changes.

## 2015-10-14 MED ORDER — TRIAMTERENE-HCTZ 37.5-25 MG PO CAPS: 1.0000 | ORAL_CAPSULE | Freq: Every day | ORAL | Status: DC

## 2015-10-14 NOTE — Telephone Encounter (Signed)
Call to patient and she is notified that prescription has been sent per her request. Patient advised to call back with any concerns and she is agreeable.  Will close encounter.

## 2015-10-14 NOTE — Telephone Encounter (Signed)
Rx done for dyazide 37.5/25mg  to [facility].  #30/1RF.  Thanks.

## 2015-10-16 ENCOUNTER — Telehealth: Payer: Self-pay | Admitting: General Practice

## 2015-10-16 NOTE — Telephone Encounter (Signed)
Patient would like to establish care with Dr. Sharlet Salina.  She is also requesting that when she establishes care that Dr. Sharlet Salina go ahead with her CPE because she has a deductible she has to meet.  She would like to know if this would be possible.  Please advise.

## 2015-10-17 NOTE — Telephone Encounter (Signed)
This all depends on what is going on with her health. Sometimes we are able to do a wellness visit but not always.

## 2015-11-20 ENCOUNTER — Other Ambulatory Visit (INDEPENDENT_AMBULATORY_CARE_PROVIDER_SITE_OTHER): Payer: BLUE CROSS/BLUE SHIELD

## 2015-11-20 ENCOUNTER — Encounter: Payer: Self-pay | Admitting: Internal Medicine

## 2015-11-20 ENCOUNTER — Ambulatory Visit (INDEPENDENT_AMBULATORY_CARE_PROVIDER_SITE_OTHER): Payer: BLUE CROSS/BLUE SHIELD | Admitting: Internal Medicine

## 2015-11-20 VITALS — BP 142/88 | HR 77 | Temp 98.2°F | Resp 16 | Ht 65.0 in | Wt 201.8 lb

## 2015-11-20 DIAGNOSIS — Z0000 Encounter for general adult medical examination without abnormal findings: Secondary | ICD-10-CM

## 2015-11-20 DIAGNOSIS — E669 Obesity, unspecified: Secondary | ICD-10-CM

## 2015-11-20 DIAGNOSIS — F4321 Adjustment disorder with depressed mood: Secondary | ICD-10-CM | POA: Diagnosis not present

## 2015-11-20 DIAGNOSIS — I10 Essential (primary) hypertension: Secondary | ICD-10-CM | POA: Diagnosis not present

## 2015-11-20 LAB — TSH: TSH: 0.94 u[IU]/mL (ref 0.35–4.50)

## 2015-11-20 LAB — CBC
HCT: 45.2 % (ref 36.0–46.0)
HEMOGLOBIN: 15.6 g/dL — AB (ref 12.0–15.0)
MCHC: 34.6 g/dL (ref 30.0–36.0)
MCV: 91.3 fl (ref 78.0–100.0)
PLATELETS: 241 10*3/uL (ref 150.0–400.0)
RBC: 4.96 Mil/uL (ref 3.87–5.11)
RDW: 13.5 % (ref 11.5–15.5)
WBC: 6 10*3/uL (ref 4.0–10.5)

## 2015-11-20 LAB — COMPREHENSIVE METABOLIC PANEL
ALBUMIN: 4.2 g/dL (ref 3.5–5.2)
ALK PHOS: 79 U/L (ref 39–117)
ALT: 20 U/L (ref 0–35)
AST: 20 U/L (ref 0–37)
BILIRUBIN TOTAL: 0.4 mg/dL (ref 0.2–1.2)
BUN: 16 mg/dL (ref 6–23)
CO2: 31 mEq/L (ref 19–32)
Calcium: 9.4 mg/dL (ref 8.4–10.5)
Chloride: 104 mEq/L (ref 96–112)
Creatinine, Ser: 0.69 mg/dL (ref 0.40–1.20)
GFR: 92.6 mL/min (ref >60.00)
Glucose, Bld: 96 mg/dL (ref 70–99)
POTASSIUM: 4.1 meq/L (ref 3.5–5.1)
SODIUM: 141 meq/L (ref 135–145)
TOTAL PROTEIN: 7.5 g/dL (ref 6.0–8.3)

## 2015-11-20 LAB — LIPID PANEL
CHOLESTEROL: 222 mg/dL — AB (ref 0–200)
HDL: 51 mg/dL (ref >39.00)
LDL Cholesterol: 149 mg/dL — ABNORMAL HIGH (ref 0–99)
NONHDL: 170.59
Total CHOL/HDL Ratio: 4
Triglycerides: 106 mg/dL (ref 0.0–149.0)
VLDL: 21.2 mg/dL (ref 0.0–40.0)

## 2015-11-20 MED ORDER — PHENTERMINE HCL 37.5 MG PO TABS: 37.5000 mg | ORAL_TABLET | Freq: Every day | ORAL | Status: DC

## 2015-11-20 NOTE — Progress Notes (Signed)
Pre visit review using our clinic review tool, if applicable. No additional management support is needed unless otherwise documented below in the visit note. 

## 2015-11-20 NOTE — Patient Instructions (Signed)
We are going to check the labs today and call you back with the results.   We think the blood pressure medicine is fine.   We have given you the appetite suppressant called phentermine that you take daily for up to 3 months as needed.   Exercising to Stay Healthy Exercising regularly is important. It has many health benefits, such as:  Improving your overall fitness, flexibility, and endurance.  Increasing your bone density.  Helping with weight control.  Decreasing your body fat.  Increasing your muscle strength.  Reducing stress and tension.  Improving your overall health. In order to become healthy and stay healthy, it is recommended that you do moderate-intensity and vigorous-intensity exercise. You can tell that you are exercising at a moderate intensity if you have a higher heart rate and faster breathing, but you are still able to hold a conversation. You can tell that you are exercising at a vigorous intensity if you are breathing much harder and faster and cannot hold a conversation while exercising. HOW OFTEN SHOULD I EXERCISE? Choose an activity that you enjoy and set realistic goals. Your health care provider can help you to make an activity plan that works for you. Exercise regularly as directed by your health care provider. This may include:   Doing resistance training twice each week, such as:  Push-ups.  Sit-ups.  Lifting weights.  Using resistance bands.  Doing a given intensity of exercise for a given amount of time. Choose from these options:  150 minutes of moderate-intensity exercise every week.  75 minutes of vigorous-intensity exercise every week.  A mix of moderate-intensity and vigorous-intensity exercise every week. Children, pregnant women, people who are out of shape, people who are overweight, and older adults may need to consult a health care provider for individual recommendations. If you have any sort of medical condition, be sure to consult  your health care provider before starting a new exercise program.  WHAT ARE SOME EXERCISE IDEAS? Some moderate-intensity exercise ideas include:   Walking at a rate of 1 mile in 15 minutes.  Biking.  Hiking.  Golfing.  Dancing. Some vigorous-intensity exercise ideas include:   Walking at a rate of at least 4.5 miles per hour.  Jogging or running at a rate of 5 miles per hour.  Biking at a rate of at least 10 miles per hour.  Lap swimming.  Roller-skating or in-line skating.  Cross-country skiing.  Vigorous competitive sports, such as football, basketball, and soccer.  Jumping rope.  Aerobic dancing. WHAT ARE SOME EVERYDAY ACTIVITIES THAT CAN HELP ME TO GET EXERCISE?  Yard work, such as:  Psychologist, educational.  Raking and bagging leaves.  Washing and waxing your car.  Pushing a stroller.  Shoveling snow.  Gardening.  Washing windows or floors. HOW CAN I BE MORE ACTIVE IN MY DAY-TO-DAY ACTIVITIES?  Use the stairs instead of the elevator.  Take a walk during your lunch break.  If you drive, park your car farther away from work or school.  If you take public transportation, get off one stop early and walk the rest of the way.  Make all of your phone calls while standing up and walking around.  Get up, stretch, and walk around every 30 minutes throughout the day. WHAT GUIDELINES SHOULD I FOLLOW WHILE EXERCISING?  Do not exercise so much that you hurt yourself, feel dizzy, or get very short of breath.  Consult your health care provider before starting a new exercise program.  Wear comfortable clothes and shoes with good support.  Drink plenty of water while you exercise to prevent dehydration or heat stroke. Body water is lost during exercise and must be replaced.  Work out until you breathe faster and your heart beats faster.   This information is not intended to replace advice given to you by your health care provider. Make sure you discuss any  questions you have with your health care provider.   Document Released: 10/10/2010 Document Revised: 09/28/2014 Document Reviewed: 02/08/2014 Elsevier Interactive Patient Education [employer].

## 2015-11-22 DIAGNOSIS — E669 Obesity, unspecified: Secondary | ICD-10-CM | POA: Insufficient documentation

## 2015-11-22 NOTE — Assessment & Plan Note (Signed)
Checking labs to look for complications. Rx for phentermine and back in 3 months.

## 2015-11-22 NOTE — Assessment & Plan Note (Signed)
Declines need for medication at this time. Talked to her about therapy and some grief counseling to help her find other ways to deal with her grief other than eating.

## 2015-11-22 NOTE — Progress Notes (Signed)
   Subjective:    Patient ID: Beth Rivers, female    DOB: [birth year], 60 y.o.   MRN: [id number]  HPI The patient is a new 60 YO female coming in for depression. She has lost her daughter in the last several months and they were very close. She has been stress eating and gained 30 pounds since that time. She denies SI/HI. Still has a good support system. She wants to lose the weight and thinks that that will help with her mood.   PMH, [facility], social history reviewed and updated.   Review of Systems  Constitutional: Positive for appetite change, fatigue and unexpected weight change. Negative for fever, chills and activity change.  HENT: Negative.   Eyes: Negative.   Respiratory: Negative for cough, chest tightness, shortness of breath and wheezing.   Cardiovascular: Negative for chest pain, palpitations and leg swelling.  Gastrointestinal: Negative for nausea, abdominal pain, diarrhea, constipation and abdominal distention.  Musculoskeletal: Positive for arthralgias. Negative for myalgias, back pain and gait problem.  Skin: Negative.   Neurological: Negative.   Psychiatric/Behavioral: Positive for sleep disturbance, dysphoric mood and decreased concentration. Negative for suicidal ideas and self-injury. The patient is not nervous/anxious.       Objective:   Physical Exam  Constitutional: She is oriented to person, place, and time. She appears well-developed and well-nourished.  HENT:  Head: Normocephalic and atraumatic.  Eyes: EOM are normal.  Neck: Normal range of motion.  Cardiovascular: Normal rate and regular rhythm.   Pulmonary/Chest: Effort normal and breath sounds normal. No respiratory distress. She has no wheezes. She has no rales.  Abdominal: Soft. Bowel sounds are normal. She exhibits no distension. There is no tenderness. There is no rebound.  Musculoskeletal: She exhibits no edema.  Neurological: She is alert and oriented to person, place, and time. Coordination normal.    Skin: Skin is warm and dry.  Psychiatric:  Tearful during the visit.    Filed Vitals:   11/20/15 0907  BP: 158/100  Pulse: 77  Temp: 98.2 F (36.8 C)  TempSrc: Oral  Resp: 16  Height: 5\' 5"  (1.651 m)  Weight: 201 lb 12.8 oz (91.536 kg)  SpO2: 98%      Assessment & Plan:

## 2015-11-22 NOTE — Assessment & Plan Note (Signed)
Taking hctz and doing well. Checking labs today and adjust as needed.

## 2016-01-21 ENCOUNTER — Encounter: Payer: BLUE CROSS/BLUE SHIELD | Admitting: Internal Medicine

## 2016-01-21 DIAGNOSIS — Z0289 Encounter for other administrative examinations: Secondary | ICD-10-CM

## 2016-01-23 ENCOUNTER — Telehealth: Payer: Self-pay | Admitting: Internal Medicine

## 2016-01-23 NOTE — Telephone Encounter (Signed)
Patient no showed for CPE on 5/2.  Please advise.

## 2016-01-28 ENCOUNTER — Ambulatory Visit (INDEPENDENT_AMBULATORY_CARE_PROVIDER_SITE_OTHER): Payer: BLUE CROSS/BLUE SHIELD | Admitting: Obstetrics & Gynecology

## 2016-01-28 ENCOUNTER — Encounter: Payer: Self-pay | Admitting: Obstetrics & Gynecology

## 2016-01-28 VITALS — BP 142/88 | HR 81 | Resp 16 | Ht 63.75 in | Wt 202.6 lb

## 2016-01-28 DIAGNOSIS — Z205 Contact with and (suspected) exposure to viral hepatitis: Secondary | ICD-10-CM | POA: Diagnosis not present

## 2016-01-28 DIAGNOSIS — Z0000 Encounter for general adult medical examination without abnormal findings: Secondary | ICD-10-CM

## 2016-01-28 DIAGNOSIS — Z01419 Encounter for gynecological examination (general) (routine) without abnormal findings: Secondary | ICD-10-CM

## 2016-01-28 DIAGNOSIS — Z20828 Contact with and (suspected) exposure to other viral communicable diseases: Secondary | ICD-10-CM | POA: Diagnosis not present

## 2016-01-28 DIAGNOSIS — D582 Other hemoglobinopathies: Secondary | ICD-10-CM

## 2016-01-28 LAB — COMPREHENSIVE METABOLIC PANEL
ALK PHOS: 88 U/L (ref 33–130)
ALT: 20 U/L (ref 6–29)
AST: 20 U/L (ref 10–35)
Albumin: 4.5 g/dL (ref 3.6–5.1)
BUN: 23 mg/dL (ref 7–25)
CALCIUM: 9.9 mg/dL (ref 8.6–10.4)
CO2: 30 mmol/L (ref 20–31)
Chloride: 97 mmol/L — ABNORMAL LOW (ref 98–110)
Creat: 0.84 mg/dL (ref 0.50–1.05)
GLUCOSE: 88 mg/dL (ref 65–99)
POTASSIUM: 4.4 mmol/L (ref 3.5–5.3)
Sodium: 138 mmol/L (ref 135–146)
Total Bilirubin: 0.7 mg/dL (ref 0.2–1.2)
Total Protein: 7.9 g/dL (ref 6.1–8.1)

## 2016-01-28 LAB — POCT URINALYSIS DIPSTICK
BILIRUBIN UA: NEGATIVE
Glucose, UA: NEGATIVE
KETONES UA: NEGATIVE
Leukocytes, UA: NEGATIVE
Nitrite, UA: NEGATIVE
PH UA: 5
Urobilinogen, UA: NEGATIVE

## 2016-01-28 LAB — CBC
HEMATOCRIT: 51.2 % — AB (ref 35.0–45.0)
HEMOGLOBIN: 17.7 g/dL — AB (ref 11.7–15.5)
MCH: 32.4 pg (ref 27.0–33.0)
MCHC: 34.6 g/dL (ref 32.0–36.0)
MCV: 93.8 fL (ref 80.0–100.0)
MPV: 9.5 fL (ref 7.5–12.5)
Platelets: 300 10*3/uL (ref 140–400)
RBC: 5.46 MIL/uL — AB (ref 3.80–5.10)
RDW: 14.4 % (ref 11.0–15.0)
WBC: 6.7 10*3/uL (ref 3.8–10.8)

## 2016-01-28 LAB — LIPID PANEL
Cholesterol: 312 mg/dL — ABNORMAL HIGH (ref 125–200)
HDL: 47 mg/dL (ref >=46)
LDL CALC: 204 mg/dL — AB (ref <130)
Total CHOL/HDL Ratio: 6.6 Ratio — ABNORMAL HIGH (ref <=5.0)
Triglycerides: 307 mg/dL — ABNORMAL HIGH (ref <150)
VLDL: 61 mg/dL — AB (ref <30)

## 2016-01-28 MED ORDER — TRIAMTERENE-HCTZ 37.5-25 MG PO CAPS: 1.0000 | ORAL_CAPSULE | Freq: Every day | ORAL | Status: DC

## 2016-01-28 NOTE — Progress Notes (Signed)
60 y.o. G2P200 Separate CaucasianF here for annual exam.  Still dating same person.  She does have a relationship with her living daughter which has been strained due to her other daughter's death.  Pt's husband won't allow the divorce to go through and this is making stressors, financially, for her.    Denies vaginal bleeding.    Still struggling with daughter's death.  She and her partner have discussed living together in [place].  They've bought land--100 acres.  PCP:  Dr. Sharlet Salina  Patient's last menstrual period was 09/22/2003.          Sexually active: Yes.    The current method of family planning is post menopausal status.    Exercising: No.  The patient does not participate in regular exercise at present. Smoker:  no  Health Maintenance: Pap:  01/25/2015 negative, HR HPV negative  History of abnormal Pap:  yes MMG:  11/29/2014 BIRADS 1 negative  Colonoscopy: 2016  BMD:   None  TDaP: 05/29/2009 Screening Labs: drawn , Hb today: drawn , Urine today: pending   reports that she has never smoked. She has never used smokeless tobacco. She reports that she drinks about 0.6 oz of alcohol per week. She reports that she does not use illicit drugs.  Past Medical History  Diagnosis Date  . Depression 09/2001  . History of uterine fibroid 11/2002  . Elevated LDL cholesterol level 1/14  . Elevated BP     No past surgical history on file.  Current Outpatient Prescriptions  Medication Sig Dispense Refill  . phentermine (ADIPEX-P) 37.5 MG tablet Take 1 tablet (37.5 mg total) by mouth daily before breakfast. 30 tablet 2  . triamterene-hydrochlorothiazide (DYAZIDE) 37.5-25 MG capsule Take 1 each (1 capsule total) by mouth daily. 30 capsule 1   No current facility-administered medications for this visit.    Family History  Problem Relation Age of Onset  . Colon cancer Maternal Grandmother   . Hypertension Mother   . Hypertension Father   . Heart attack Father   . Sudden death  Daughter     ROS:  Pertinent items are noted in HPI.  Otherwise, a comprehensive ROS was negative.  Exam:   General appearance: alert, cooperative and appears stated age Head: Normocephalic, without obvious abnormality, atraumatic Neck: no adenopathy, supple, symmetrical, trachea midline and thyroid normal to inspection and palpation Lungs: clear to auscultation bilaterally Breasts: normal appearance, no masses or tenderness Heart: regular rate and rhythm Abdomen: soft, non-tender; bowel sounds normal; no masses,  no organomegaly Extremities: extremities normal, atraumatic, no cyanosis or edema Skin: Skin color, texture, turgor normal. No rashes or lesions Lymph nodes: Cervical, supraclavicular, and axillary nodes normal. No abnormal inguinal nodes palpated Neurologic: Grossly normal   Pelvic: External genitalia:  no lesions              Urethra:  normal appearing urethra with no masses, tenderness or lesions              Bartholins and Skenes: normal                 Vagina: normal appearing vagina with normal color and discharge, no lesions              Cervix: no lesions              Pap taken: No. Bimanual Exam:  Uterus:  normal size, contour, position, consistency, mobility, non-tender  Adnexa: normal adnexa and no mass, fullness, tenderness               Rectovaginal: Confirms               Anus:  normal sphincter tone, no lesions  Chaperone was present for exam.  A:  Well Woman with normal exam PMP, no HRT Grief rx from daughter's death, continues to struggle with this.  Declines treatment or therapy. Hypertension Dizziness  P: Mammogram yearly pap smear with neg HR HPV 2016 CMP, CBC, Lipids Hep C antibody screening RF for Dyazide 37.5/25mg  daily.  #30/12RF Return annually or prn

## 2016-01-29 LAB — HEPATITIS C ANTIBODY: HCV Ab: NEGATIVE

## 2016-01-29 NOTE — Addendum Note (Signed)
Addended by: Megan Salon on: 01/29/2016 04:00 PM   Modules accepted: Orders, SmartSet

## 2016-01-30 ENCOUNTER — Telehealth: Payer: Self-pay | Admitting: Emergency Medicine

## 2016-01-30 ENCOUNTER — Encounter: Payer: Self-pay | Admitting: Obstetrics & Gynecology

## 2016-01-30 NOTE — Telephone Encounter (Signed)
-----   Message from Megan Salon, MD sent at 01/29/2016  3:58 PM EDT ----- Please let pt know that her cholesterol is elevated.  She needs to follow up with her PCP about this.  Also, her hemoglobin is elevated to 17.7.  She could have a blood disorder called polycythemia vera which makes a clot more likely.  Should start a full aspirin and needs to see hematology.  Referral to Dr. Marin Olp placed.  Can you watch and make sure appt gets scheduled?  Thanks.

## 2016-01-30 NOTE — Telephone Encounter (Signed)
Contacted patient and results discussed.  She is agreeable to follow up with Dr. Sharlet Salina to discuss lipid panel results (patient reports she was fasting) and Dr. Marin Olp for evaluation of elevated hemoglobin. Patient is advised to call back if she does not hear from Dr. Antonieta Pert office in 3-5 days. Advised referral placed for Dr. Marin Olp in [place].   Patient verbalized understanding of results and will follow up with PCP and referral.  She is advised that rx is placed for BP medication.   Routing to provider for final review. Patient agreeable to disposition. Will close encounter.

## 2016-01-30 NOTE — Telephone Encounter (Signed)
Responded to patient via mychart.  Update to Dr. Sabra Heck.  Will close [employer] encounter.

## 2016-01-30 NOTE — Telephone Encounter (Signed)
-----   Message from Megan Salon, MD sent at 01/29/2016  3:59 PM EDT ----- Please let her know that her Hep C was negative and CMP was fine as well.

## 2016-02-05 ENCOUNTER — Telehealth: Payer: Self-pay | Admitting: Obstetrics & Gynecology

## 2016-02-05 NOTE — Telephone Encounter (Signed)
Routing to Theresia Lo for review of referral to Dr.Ennvever placed on 01/29/2016 by [place].

## 2016-02-05 NOTE — Telephone Encounter (Signed)
Patient is calling regarding the status of her referral. Patient last seen May 9th with Dr.Miller.

## 2016-02-05 NOTE — Telephone Encounter (Signed)
Spoke with patient. Dr Dicie Beam office states working to schedule. Patient agreeable. Ok to close.

## 2016-02-07 NOTE — Telephone Encounter (Signed)
fine

## 2016-02-26 ENCOUNTER — Other Ambulatory Visit (HOSPITAL_BASED_OUTPATIENT_CLINIC_OR_DEPARTMENT_OTHER): Payer: BLUE CROSS/BLUE SHIELD

## 2016-02-26 ENCOUNTER — Ambulatory Visit (HOSPITAL_BASED_OUTPATIENT_CLINIC_OR_DEPARTMENT_OTHER): Payer: BLUE CROSS/BLUE SHIELD | Admitting: Hematology & Oncology

## 2016-02-26 ENCOUNTER — Encounter: Payer: Self-pay | Admitting: Hematology & Oncology

## 2016-02-26 ENCOUNTER — Ambulatory Visit: Payer: BLUE CROSS/BLUE SHIELD

## 2016-02-26 VITALS — BP 146/94 | HR 74 | Temp 98.1°F | Resp 18 | Ht 63.0 in | Wt 203.0 lb

## 2016-02-26 DIAGNOSIS — D751 Secondary polycythemia: Secondary | ICD-10-CM

## 2016-02-26 DIAGNOSIS — C181 Malignant neoplasm of appendix: Secondary | ICD-10-CM | POA: Diagnosis not present

## 2016-02-26 LAB — CBC WITH DIFFERENTIAL (CANCER CENTER ONLY)
BASO#: 0 10*3/uL (ref 0.0–0.2)
BASO%: 0.5 % (ref 0.0–2.0)
EOS ABS: 0.2 10*3/uL (ref 0.0–0.5)
EOS%: 2.7 % (ref 0.0–7.0)
HCT: 45.5 % (ref 34.8–46.6)
HEMOGLOBIN: 15.9 g/dL (ref 11.6–15.9)
LYMPH#: 1.7 10*3/uL (ref 0.9–3.3)
LYMPH%: 27.4 % (ref 14.0–48.0)
MCH: 32.6 pg (ref 26.0–34.0)
MCHC: 34.9 g/dL (ref 32.0–36.0)
MCV: 93 fL (ref 81–101)
MONO#: 0.6 10*3/uL (ref 0.1–0.9)
MONO%: 8.8 % (ref 0.0–13.0)
NEUT%: 60.6 % (ref 39.6–80.0)
NEUTROS ABS: 3.8 10*3/uL (ref 1.5–6.5)
PLATELETS: 236 10*3/uL (ref 145–400)
RBC: 4.88 10*6/uL (ref 3.70–5.32)
RDW: 13.6 % (ref 11.1–15.7)
WBC: 6.2 10*3/uL (ref 3.9–10.0)

## 2016-02-26 LAB — COMPREHENSIVE METABOLIC PANEL (CC13)
A/G RATIO: 1.3 (ref 1.2–2.2)
ALBUMIN: 3.9 g/dL (ref 3.5–5.5)
ALK PHOS: 75 IU/L (ref 39–117)
ALT: 21 IU/L (ref 0–32)
AST (SGOT): 19 IU/L (ref 0–40)
BUN / CREAT RATIO: 28 — AB (ref 9–23)
BUN: 18 mg/dL (ref 6–24)
Bilirubin Total: 0.2 mg/dL (ref 0.0–1.2)
CO2: 26 mmol/L (ref 18–29)
CREATININE: 0.64 mg/dL (ref 0.57–1.00)
Calcium, Ser: 8.8 mg/dL (ref 8.7–10.2)
Chloride, Ser: 103 mmol/L (ref 96–106)
GFR calc Af Amer: 113 mL/min/{1.73_m2} (ref >59)
GFR calc non Af Amer: 98 mL/min/{1.73_m2} (ref >59)
GLOBULIN, TOTAL: 3 g/dL (ref 1.5–4.5)
Glucose: 90 mg/dL (ref 65–99)
POTASSIUM: 3.9 mmol/L (ref 3.5–5.2)
SODIUM: 140 mmol/L (ref 134–144)
Total Protein: 6.9 g/dL (ref 6.0–8.5)

## 2016-02-26 NOTE — Progress Notes (Signed)
Referral MD  Reason for Referral: Transient erythrocytosis   Chief Complaint  Patient presents with  . Other    New patient  : My  red blood cell count is high.  HPI: Mrs. Vollmer is a very charming 60 year old white female. She certainly looks a lot younger. She is followed by Dr. Sabra Heck of OB/GYN. She does have a history of obesity. She is on some weight loss medication. She is on some diuretic for blood pressure.  She was found to have an elevated hemoglobin couple months ago. She had routine lab work done. Her white cell count was 6. Hemoglobin 15.6. And platelet count 2 41,000. MCV was 91.  In May, her white cell count was 6.7. Hemoglobin 17.7 and platelet count 300,000. MCV was 94.  At that point, Dr. Sabra Heck felt that a hematologic evaluation was indicated. She told Mrs. Walko to start full dose aspirin.  Ms. Shaddock set up for her lab work done in May, she did not eat or drink anything for a day.  She's not had any issue with headaches. She's had no itchiness of the skin after showering. She's had no rashes. She's had no leg swelling. She's had no cough.  She does not smoke. She has a drink on occasion.  Her last mammogram was back in March 2016.  She has not had surgery. Her gynecologic parts are still in place.  Is very tough for her the past 4 years. Her one of her daughters died. She has really felt horrible. She just does not want to live. Now she is getting a lot better. Her younger daughter is engaged and she is excited about this.  She has had no fever. She's had no cough. She has had no nausea or vomiting.  She is originally from [place]. It was fun talking to her about [place].  Overall, her performance status is ECOG 0.   Past Medical History  Diagnosis Date  . Depression 09/2001  . History of uterine fibroid 11/2002  . Elevated LDL cholesterol level 1/14  . Elevated BP   :  No past surgical history on file.:   Current outpatient prescriptions:   .  aspirin 325 MG tablet, Take 325 mg by mouth daily., Disp: , Rfl:  .  phentermine (ADIPEX-P) 37.5 MG tablet, Take 1 tablet (37.5 mg total) by mouth daily before breakfast., Disp: 30 tablet, Rfl: 2 .  triamterene-hydrochlorothiazide (DYAZIDE) 37.5-25 MG capsule, Take 1 each (1 capsule total) by mouth daily., Disp: 30 capsule, Rfl: 12:  :  No Known Allergies:  Family History  Problem Relation Age of Onset  . Colon cancer Maternal Grandmother   . Hypertension Mother   . Hypertension Father   . Heart attack Father   . Sudden death Daughter   :  Social History   Social History  . Marital Status: Unknown    Spouse Name: N/A  . Number of Children: N/A  . Years of Education: N/A   Occupational History  . Not on file.   Social History Main Topics  . Smoking status: Never Smoker   . Smokeless tobacco: Never Used  . Alcohol Use: 0.6 oz/week    1 Glasses of wine per week     Comment: glass of wine weekly, if that  . Drug Use: No  . Sexual Activity:    Partners: Male   Other Topics Concern  . Not on file   Social History Narrative  :  Pertinent items are noted  in HPI.  Exam: @IPVITALS @ Obese white female in no obvious distress. Vital signs show a temperature of 98.1. Pulse 74. Blood pressure 146/94. Weight is 203 pounds. Head and neck exam shows no ocular or oral lesions. She has no facial plethora. She has no conjunctival inflammation. There is no adenopathy in the neck. Thyroid is nonpalpable. Lungs are clear bilaterally. Cardiac exam regular rate and rhythm with no murmurs, rubs or bruits. Abdomen is soft. She has good bowel sounds. There is no fluid wave. There is no palpable liver or spleen tip. Back exam shows no tenderness over the spine, ribs or hips. Externally shows no clubbing, cyanosis or edema. She is good range of motion of her joints. Skin exam shows no Ruddy complexion. She has no suspicious hyperpigmented lesions. She does have fair skin. Neurological exam  shows no focal neurological deficits.    Recent Labs  02/26/16 1522  WBC 6.2  HGB 15.9  HCT 45.5  PLT 236   No results for input(s): NA, K, CL, CO2, GLUCOSE, BUN, CREATININE, CALCIUM in the last 72 hours.  Blood smear review:  Normochromic and normocytic population of red blood cells. She has no nucleated red blood cells. There are no teardrop cells. She has no schistocytes or spherocytes. She has no target cells. White blood cells are normal in morphology maturation. She has no immature myeloid or lymphoid forms. Platelets are adequate in number and size.  Pathology: None     Assessment and Plan:  Ms. Peerenboom is a very charming 60 year old white female. She had transient erythrocytosis. I cannot find anything on her labs or on her physical exam that would suggest a bone marrow disorder. I do not leave that she has polycythemia. She does not have the typical "body habitus" of a polycythemia.  I reassured her that I just do not think that she had a problem. She may have spurious os aphemia as she is overweight and is on diuretics. As such, she may have a decreased plasma volume that may cause the hemoglobin to appear higher. I do think that a baby aspirin and not full dose aspirin would be warranted for her. I would just say that because of her age. Vitamin D is also very good.\  I don't see that we have to do a bone marrow test on her. I did send also additional blood studies just to be on the thorough side to make sure that we might not be overlooking another issue. I will call her with those results.  I spent about 45 minutes with her. She is very nice. I had a nice chat with her. I reassured her. She felt much better after our meeting.  I just don't believe we have to see her back.

## 2016-02-27 LAB — IRON AND TIBC
%SAT: 23 % (ref 21–57)
IRON: 65 ug/dL (ref 41–142)
TIBC: 280 ug/dL (ref 236–444)
UIBC: 215 ug/dL (ref 120–384)

## 2016-02-27 LAB — LACTATE DEHYDROGENASE: LDH: 192 U/L (ref 125–245)

## 2016-02-27 LAB — FERRITIN: Ferritin: 90 ng/ml (ref 9–269)

## 2016-02-27 LAB — ERYTHROPOIETIN: Erythropoietin: 9.6 m[IU]/mL (ref 2.6–18.5)

## 2016-02-28 LAB — HEMOGLOBINOPATHY EVALUATION
HEMOGLOBIN F QUANTITATION: 0.5 % (ref 0.0–2.0)
HGB C: 0 %
HGB S: 0 %
Hemoglobin A2 Quantitation: 2.4 % (ref 0.7–3.1)
Hgb A: 97.1 % (ref 94.0–98.0)

## 2016-03-10 ENCOUNTER — Encounter: Payer: Self-pay | Admitting: Hematology & Oncology

## 2016-03-11 ENCOUNTER — Encounter: Payer: Self-pay | Admitting: *Deleted

## 2016-04-10 ENCOUNTER — Ambulatory Visit: Payer: BLUE CROSS/BLUE SHIELD | Admitting: Obstetrics & Gynecology

## 2016-07-13 DIAGNOSIS — R0681 Apnea, not elsewhere classified: Secondary | ICD-10-CM | POA: Diagnosis not present

## 2016-07-13 DIAGNOSIS — R0683 Snoring: Secondary | ICD-10-CM | POA: Diagnosis not present

## 2016-09-11 ENCOUNTER — Other Ambulatory Visit: Payer: Self-pay | Admitting: Internal Medicine

## 2016-09-15 NOTE — Telephone Encounter (Signed)
I have left message advising patient to call back to schedule appt in order to have med refilled---can talk with tamara if any questions, or will need first available appt in order to refill med

## 2016-09-15 NOTE — Telephone Encounter (Signed)
Routing to dr crawford, please advise, thanks 

## 2017-02-27 ENCOUNTER — Telehealth: Payer: Self-pay | Admitting: Obstetrics & Gynecology

## 2017-03-17 NOTE — Telephone Encounter (Signed)
Patient called and scheduled an AEX with Dr. Sabra Heck this Friday, 03/19/17. She said she does not need a refill before then and can just request it at her appointment.

## 2017-03-19 ENCOUNTER — Ambulatory Visit (INDEPENDENT_AMBULATORY_CARE_PROVIDER_SITE_OTHER): Payer: BLUE CROSS/BLUE SHIELD | Admitting: Obstetrics & Gynecology

## 2017-03-19 ENCOUNTER — Encounter: Payer: Self-pay | Admitting: Obstetrics & Gynecology

## 2017-03-19 VITALS — BP 126/84 | HR 76 | Resp 16 | Ht 62.75 in | Wt 194.0 lb

## 2017-03-19 DIAGNOSIS — Z01419 Encounter for gynecological examination (general) (routine) without abnormal findings: Secondary | ICD-10-CM

## 2017-03-19 DIAGNOSIS — Z0000 Encounter for general adult medical examination without abnormal findings: Secondary | ICD-10-CM

## 2017-03-19 MED ORDER — PHENTERMINE HCL 37.5 MG PO TABS: 37.5000 mg | ORAL_TABLET | Freq: Every day | ORAL | 0 refills | Status: DC

## 2017-03-19 NOTE — Progress Notes (Addendum)
61 y.o. G2P2001 Separate CaucasianF here for annual exam.  Went back to work in [place].  Working for [employer].  Doing some traveling.  Feeling much more financially independent.  Broke up with boyfriend.  Daughter's relationship is better.  They went to [place] together.    Denies vaginal bleeding.  Patient's last menstrual period was 09/22/2003.          Sexually active: No.  The current method of family planning is post menopausal status.    Exercising: Yes.    walking Smoker:  no  Health Maintenance: Pap:  01/25/15 Neg. HR HPV:neg   1/14/ Normal  History of abnormal Pap:  yes MMG:  11/29/14 BIRADS1:neg  Colonoscopy:  2017 normal. F/u 10 years. [facility]. BMD:   never TDaP:  05/2009  Pneumonia vaccine(s): No Zostavax:  No Hep C testing: 01/28/16 Neg  Screening Labs: Will come back fasting.    reports that she has never smoked. She has never used smokeless tobacco. She reports that she drinks about 0.6 oz of alcohol per week . She reports that she does not use drugs.  Past Medical History:  Diagnosis Date  . Depression 09/2001  . Elevated BP   . Elevated LDL cholesterol level 1/14  . History of uterine fibroid 11/2002    History reviewed. No pertinent surgical history.  Current Outpatient Prescriptions  Medication Sig Dispense Refill  . aspirin 325 MG tablet Take 325 mg by mouth daily.    . phentermine (ADIPEX-P) 37.5 MG tablet Take 1 tablet (37.5 mg total) by mouth daily before breakfast. 30 tablet 2  . triamterene-hydrochlorothiazide (DYAZIDE) 37.5-25 MG capsule Take 1 each (1 capsule total) by mouth daily. 30 capsule 12   No current facility-administered medications for this visit.     Family History  Problem Relation Age of Onset  . Hypertension Mother   . Hypertension Father   . Heart attack Father   . Sudden death Daughter   . Colon cancer Maternal Grandmother     ROS:  Pertinent items are noted in HPI.  Otherwise, a comprehensive ROS was negative.  Exam:   BP 126/84 (BP  Location: Right Arm, Patient Position: Sitting, Cuff Size: Large)   Pulse 76   Resp 16   Ht 5' 2.75" (1.594 m)   Wt 194 lb (88 kg)   LMP 09/22/2003   BMI 34.64 kg/m   Weight change: -8#   Height: 5' 2.75" (159.4 cm)  Ht Readings from Last 3 Encounters:  03/19/17 5' 2.75" (1.594 m)  02/26/16 5\' 3"  (1.6 m)  01/28/16 5' 3.75" (1.619 m)    General appearance: alert, cooperative and appears stated age Head: Normocephalic, without obvious abnormality, atraumatic Neck: no adenopathy, supple, symmetrical, trachea midline and thyroid normal to inspection and palpation Lungs: clear to auscultation bilaterally Breasts: normal appearance, no masses or tenderness Heart: regular rate and rhythm Abdomen: soft, non-tender; bowel sounds normal; no masses,  no organomegaly Extremities: extremities normal, atraumatic, no cyanosis or edema Skin: Skin color, texture, turgor normal. No rashes or lesions Lymph nodes: Cervical, supraclavicular, and axillary nodes normal. No abnormal inguinal nodes palpated Neurologic: Grossly normal   Pelvic: External genitalia:  no lesions              Urethra:  normal appearing urethra with no masses, tenderness or lesions              Bartholins and Skenes: normal  Vagina: normal appearing vagina with normal color and discharge, no lesions              Cervix: no lesions              Pap taken: No. Bimanual Exam:  Uterus:  normal size, contour, position, consistency, mobility, non-tender              Adnexa: normal adnexa and no mass, fullness, tenderness               Rectovaginal: Confirms               Anus:  normal sphincter tone, no lesions  Chaperone was present for exam.  A:  Well Woman with normal exam PMP, no HRT Hypertension Desires weight loss  P:   Mammogram guidelines reviewed pap smear UTD.  Will plan repeat pap and HR HPV next year Return for fasting labs:  CBC, CMP, Lipids, TSH, Vit D Restart phentermine 37.5mg  daily.   #30/0RF.  Risks, benefits reviewed.  Rx for shingles vaccine given. return annually or prn

## 2017-03-22 ENCOUNTER — Other Ambulatory Visit: Payer: BLUE CROSS/BLUE SHIELD

## 2017-03-22 ENCOUNTER — Telehealth: Payer: Self-pay | Admitting: Obstetrics & Gynecology

## 2017-03-22 NOTE — Telephone Encounter (Signed)
Left message to reschedule missed lab appointment.

## 2017-04-09 DIAGNOSIS — G4733 Obstructive sleep apnea (adult) (pediatric): Secondary | ICD-10-CM | POA: Diagnosis not present

## 2017-04-13 DIAGNOSIS — G4733 Obstructive sleep apnea (adult) (pediatric): Secondary | ICD-10-CM | POA: Diagnosis not present

## 2017-05-07 DIAGNOSIS — G4733 Obstructive sleep apnea (adult) (pediatric): Secondary | ICD-10-CM | POA: Diagnosis not present

## 2017-05-10 ENCOUNTER — Telehealth: Payer: Self-pay | Admitting: *Deleted

## 2017-05-10 NOTE — Telephone Encounter (Addendum)
Call to patient. Patient scheduled for blood work for Friday 05/14/17 at 0830. Patient agreeable to date and time of appointment. Future orders present for lab work.   Routing to provider for final review. Patient agreeable to disposition. Will close encounter.

## 2017-05-10 NOTE — Telephone Encounter (Signed)
-----   Message from Megan Salon, MD sent at 05/08/2017  9:53 AM EDT ----- Regarding: has not come for fasting lab work Pt has not returned for lab work.  Can you call her so I know if I should cancel the lab orders.  Thanks.  Vinnie Level

## 2017-05-13 NOTE — Addendum Note (Signed)
Addended by: Graylon Good on: 05/13/2017 01:49 PM   Modules accepted: Orders

## 2017-05-14 ENCOUNTER — Other Ambulatory Visit: Payer: Self-pay

## 2017-05-14 ENCOUNTER — Telehealth: Payer: Self-pay | Admitting: Obstetrics & Gynecology

## 2017-05-14 NOTE — Telephone Encounter (Signed)
Left message on voicemail to call and reschedule lab appointment. °

## 2017-06-07 DIAGNOSIS — G4733 Obstructive sleep apnea (adult) (pediatric): Secondary | ICD-10-CM | POA: Diagnosis not present

## 2017-07-05 ENCOUNTER — Other Ambulatory Visit: Payer: Self-pay | Admitting: Obstetrics & Gynecology

## 2017-07-05 NOTE — Telephone Encounter (Signed)
Medication refill request: Maxzide  Last AEX:  03-19-17  Next AEX: 05-31-18  Last MMG (if hormonal medication request):11-29-14 WNL  Refill authorized: please advise

## 2017-07-05 NOTE — Telephone Encounter (Signed)
Patient states she only takes this medication PRN so she is just now running out. She states she is retaining fluid and needs it now. She has not seen her PCP in over a year.

## 2017-07-05 NOTE — Telephone Encounter (Signed)
Can you check with pharmacy about provider who write this last?  I did it in 01/2016 but she should have been out months ago.  Thanks.

## 2017-07-06 NOTE — Telephone Encounter (Signed)
Dr. Nathanial Millman last note indicated she should be taking this daily and not PRN.  I have done the RF for #30/0 but I think she needs to follow-up with Dr. Sharlet Salina.

## 2017-07-06 NOTE — Telephone Encounter (Signed)
Spoke with patient and gave message as below. Patient voiced understanding -eh

## 2017-07-07 DIAGNOSIS — G4733 Obstructive sleep apnea (adult) (pediatric): Secondary | ICD-10-CM | POA: Diagnosis not present

## 2017-07-19 DIAGNOSIS — G4733 Obstructive sleep apnea (adult) (pediatric): Secondary | ICD-10-CM | POA: Diagnosis not present

## 2017-07-20 ENCOUNTER — Other Ambulatory Visit: Payer: BLUE CROSS/BLUE SHIELD

## 2017-07-20 ENCOUNTER — Ambulatory Visit (INDEPENDENT_AMBULATORY_CARE_PROVIDER_SITE_OTHER): Payer: BLUE CROSS/BLUE SHIELD | Admitting: Internal Medicine

## 2017-07-20 ENCOUNTER — Encounter: Payer: Self-pay | Admitting: Internal Medicine

## 2017-07-20 VITALS — BP 128/80 | HR 74 | Temp 98.4°F | Ht 62.75 in | Wt 209.0 lb

## 2017-07-20 DIAGNOSIS — J029 Acute pharyngitis, unspecified: Secondary | ICD-10-CM | POA: Diagnosis not present

## 2017-07-20 DIAGNOSIS — Z0000 Encounter for general adult medical examination without abnormal findings: Secondary | ICD-10-CM | POA: Diagnosis not present

## 2017-07-20 MED ORDER — AZITHROMYCIN 250 MG PO TABS: ORAL_TABLET | ORAL | 0 refills | Status: DC

## 2017-07-20 MED ORDER — TRIAMTERENE-HCTZ 37.5-25 MG PO TABS: 1.0000 | ORAL_TABLET | Freq: Every day | ORAL | 6 refills | Status: DC

## 2017-07-20 NOTE — Progress Notes (Signed)
   Subjective:    Patient ID: Beth Rivers, female    DOB: [birth year], 61 y.o.   MRN: [id number]  HPI The patient is a 61 YO female coming in for sore throat. Started about 4-5 days ago. She traveled to [place] last week and co-worker was sick there. She denies fevers but some chills. Having some body aches. She is feeling like it is getting worse overall. Some cough although no SOB. Some neck swelling and mild sore throat. Tried otc cold medicine which is not helpful.   Review of Systems  Constitutional: Positive for activity change, chills and fatigue. Negative for appetite change and unexpected weight change.  HENT: Positive for congestion, postnasal drip, rhinorrhea and sore throat. Negative for sinus pain, sinus pressure and trouble swallowing.   Eyes: Negative.   Respiratory: Positive for cough. Negative for chest tightness and shortness of breath.   Cardiovascular: Negative.   Gastrointestinal: Negative.   Musculoskeletal: Positive for myalgias.  Neurological: Negative.       Objective:   Physical Exam  Constitutional: She is oriented to person, place, and time. She appears well-developed and well-nourished.  HENT:  Head: Normocephalic and atraumatic.  Oropharynx with redness and clear drainage  Eyes: EOM are normal.  Neck: Normal range of motion. No JVD present.  Cardiovascular: Normal rate and regular rhythm.   Pulmonary/Chest: Effort normal and breath sounds normal. No respiratory distress. She has no wheezes. She has no rales.  Abdominal: Soft.  Lymphadenopathy:    She has cervical adenopathy.  Neurological: She is alert and oriented to person, place, and time.  Skin: Skin is warm and dry.   Vitals:   07/20/17 1530  BP: 128/80  Pulse: 74  Temp: 98.4 F (36.9 C)  TempSrc: Oral  SpO2: 98%  Weight: 209 lb (94.8 kg)  Height: 5' 2.75" (1.594 m)      Assessment & Plan:

## 2017-07-20 NOTE — Patient Instructions (Signed)
Consider reading [facility] line eating to get some insight about diet and weight loss and how the brain works with weight.   We are checking the labs.   We have sent in azithromycin. Take 2 pills on day 1, then 1 pill daily days 2-5.

## 2017-07-20 NOTE — Assessment & Plan Note (Signed)
Rx for azithromycin given length of symptoms and overall worsening.

## 2017-08-09 ENCOUNTER — Encounter: Payer: BLUE CROSS/BLUE SHIELD | Admitting: Internal Medicine

## 2017-09-17 ENCOUNTER — Telehealth: Payer: Self-pay | Admitting: Internal Medicine

## 2017-09-17 NOTE — Telephone Encounter (Signed)
Check [place] registry last filled phentermine 03/28/2017...Johny Chess

## 2017-09-17 NOTE — Telephone Encounter (Signed)
Copied from [place] [id number]. Topic: Quick Communication - See Telephone Encounter >> Sep 17, 2017  8:48 AM Aurelio Brash B wrote: CRM for notification. See Telephone encounter for:  Pt called to get refill  triamterene-hydrochlorothiazide (MAXZIDE-25) 37.5-25 MG tablet phetermine refilled [facility] [address]  09/17/17.  Pt would like a call when  The rx is sent in

## 2017-09-17 NOTE — Telephone Encounter (Signed)
Pt is requesting the refill on PHENTERMINE 37.5mg . She said that is all she needs now.

## 2017-09-17 NOTE — Telephone Encounter (Signed)
Left message for pt to contact pharmacy for refills prescription was written on 06/2017 with 6 refills.

## 2017-09-23 NOTE — Telephone Encounter (Signed)
Cannot be refilled without a visit.

## 2017-09-27 ENCOUNTER — Ambulatory Visit (INDEPENDENT_AMBULATORY_CARE_PROVIDER_SITE_OTHER): Payer: BLUE CROSS/BLUE SHIELD | Admitting: Internal Medicine

## 2017-09-27 ENCOUNTER — Encounter: Payer: Self-pay | Admitting: Internal Medicine

## 2017-09-27 ENCOUNTER — Other Ambulatory Visit (INDEPENDENT_AMBULATORY_CARE_PROVIDER_SITE_OTHER): Payer: BLUE CROSS/BLUE SHIELD

## 2017-09-27 VITALS — BP 110/72 | HR 82 | Temp 97.8°F | Ht 62.75 in | Wt 209.0 lb

## 2017-09-27 DIAGNOSIS — I10 Essential (primary) hypertension: Secondary | ICD-10-CM | POA: Diagnosis not present

## 2017-09-27 DIAGNOSIS — E6609 Other obesity due to excess calories: Secondary | ICD-10-CM | POA: Diagnosis not present

## 2017-09-27 DIAGNOSIS — Z0000 Encounter for general adult medical examination without abnormal findings: Secondary | ICD-10-CM

## 2017-09-27 DIAGNOSIS — R0902 Hypoxemia: Secondary | ICD-10-CM | POA: Diagnosis not present

## 2017-09-27 LAB — COMPREHENSIVE METABOLIC PANEL
ALK PHOS: 68 U/L (ref 39–117)
ALT: 20 U/L (ref 0–35)
AST: 17 U/L (ref 0–37)
Albumin: 4.2 g/dL (ref 3.5–5.2)
BILIRUBIN TOTAL: 0.4 mg/dL (ref 0.2–1.2)
BUN: 26 mg/dL — AB (ref 6–23)
CO2: 29 meq/L (ref 19–32)
Calcium: 9.3 mg/dL (ref 8.4–10.5)
Chloride: 104 mEq/L (ref 96–112)
Creatinine, Ser: 0.83 mg/dL (ref 0.40–1.20)
GFR: 74.35 mL/min (ref >60.00)
GLUCOSE: 99 mg/dL (ref 70–99)
Potassium: 4.2 mEq/L (ref 3.5–5.1)
SODIUM: 140 meq/L (ref 135–145)
TOTAL PROTEIN: 7.2 g/dL (ref 6.0–8.3)

## 2017-09-27 LAB — TSH: TSH: 1.25 u[IU]/mL (ref 0.35–4.50)

## 2017-09-27 LAB — LIPID PANEL
CHOL/HDL RATIO: 5
Cholesterol: 238 mg/dL — ABNORMAL HIGH (ref 0–200)
HDL: 45.9 mg/dL (ref >39.00)
LDL Cholesterol: 156 mg/dL — ABNORMAL HIGH (ref 0–99)
NONHDL: 191.99
Triglycerides: 182 mg/dL — ABNORMAL HIGH (ref 0.0–149.0)
VLDL: 36.4 mg/dL (ref 0.0–40.0)

## 2017-09-27 LAB — T4, FREE: Free T4: 0.88 ng/dL (ref 0.60–1.60)

## 2017-09-27 LAB — CBC
HEMATOCRIT: 46.3 % — AB (ref 36.0–46.0)
HEMOGLOBIN: 15.9 g/dL — AB (ref 12.0–15.0)
MCHC: 34.3 g/dL (ref 30.0–36.0)
MCV: 94.4 fl (ref 78.0–100.0)
Platelets: 253 10*3/uL (ref 150.0–400.0)
RBC: 4.9 Mil/uL (ref 3.87–5.11)
RDW: 13.4 % (ref 11.5–15.5)
WBC: 9.9 10*3/uL (ref 4.0–10.5)

## 2017-09-27 LAB — HEMOGLOBIN A1C: HEMOGLOBIN A1C: 5.3 % (ref 4.6–6.5)

## 2017-09-27 MED ORDER — TRIAMTERENE-HCTZ 37.5-25 MG PO TABS: 1.0000 | ORAL_TABLET | Freq: Every day | ORAL | 6 refills | Status: DC

## 2017-09-27 MED ORDER — PHENTERMINE HCL 37.5 MG PO TABS: 37.5000 mg | ORAL_TABLET | Freq: Every day | ORAL | 0 refills | Status: DC

## 2017-09-27 NOTE — Patient Instructions (Signed)
We have sent in the phentermine to use when needed.   We will check the labs today you do not have to be fasting.  Health Maintenance, Female Adopting a healthy lifestyle and getting preventive care can go a long way to promote health and wellness. Talk with your health care provider about what schedule of regular examinations is right for you. This is a good chance for you to check in with your provider about disease prevention and staying healthy. In between checkups, there are plenty of things you can do on your own. Experts have done a lot of research about which lifestyle changes and preventive measures are most likely to keep you healthy. Ask your health care provider for more information. Weight and diet Eat a healthy diet  Be sure to include plenty of vegetables, fruits, low-fat dairy products, and lean protein.  Do not eat a lot of foods high in solid fats, added sugars, or salt.  Get regular exercise. This is one of the most important things you can do for your health. ? Most adults should exercise for at least 150 minutes each week. The exercise should increase your heart rate and make you sweat (moderate-intensity exercise). ? Most adults should also do strengthening exercises at least twice a week. This is in addition to the moderate-intensity exercise.  Maintain a healthy weight  Body mass index (BMI) is a measurement that can be used to identify possible weight problems. It estimates body fat based on height and weight. Your health care provider can help determine your BMI and help you achieve or maintain a healthy weight.  For females 80 years of age and older: ? A BMI below 18.5 is considered underweight. ? A BMI of 18.5 to 24.9 is normal. ? A BMI of 25 to 29.9 is considered overweight. ? A BMI of 30 and above is considered obese.  Watch levels of cholesterol and blood lipids  You should start having your blood tested for lipids and cholesterol at 61 years of age, then  have this test every 5 years.  You may need to have your cholesterol levels checked more often if: ? Your lipid or cholesterol levels are high. ? You are older than 61 years of age. ? You are at high risk for heart disease.  Cancer screening Lung Cancer  Lung cancer screening is recommended for adults 33-10 years old who are at high risk for lung cancer because of a history of smoking.  A yearly low-dose CT scan of the lungs is recommended for people who: ? Currently smoke. ? Have quit within the past 15 years. ? Have at least a 30-pack-year history of smoking. A pack year is smoking an average of one pack of cigarettes a day for 1 year.  Yearly screening should continue until it has been 15 years since you quit.  Yearly screening should stop if you develop a health problem that would prevent you from having lung cancer treatment.  Breast Cancer  Practice breast self-awareness. This means understanding how your breasts normally appear and feel.  It also means doing regular breast self-exams. Let your health care provider know about any changes, no matter how small.  If you are in your 20s or 30s, you should have a clinical breast exam (CBE) by a health care provider every 1-3 years as part of a regular health exam.  If you are 45 or older, have a CBE every year. Also consider having a breast X-ray (mammogram) every year.  If you have a family history of breast cancer, talk to your health care provider about genetic screening.  If you are at high risk for breast cancer, talk to your health care provider about having an MRI and a mammogram every year.  Breast cancer gene (BRCA) assessment is recommended for women who have family members with BRCA-related cancers. BRCA-related cancers include: ? Breast. ? Ovarian. ? Tubal. ? Peritoneal cancers.  Results of the assessment will determine the need for genetic counseling and BRCA1 and BRCA2 testing.  Cervical Cancer Your health  care provider may recommend that you be screened regularly for cancer of the pelvic organs (ovaries, uterus, and vagina). This screening involves a pelvic examination, including checking for microscopic changes to the surface of your cervix (Pap test). You may be encouraged to have this screening done every 3 years, beginning at age 21.  For women ages 30-65, health care providers may recommend pelvic exams and Pap testing every 3 years, or they may recommend the Pap and pelvic exam, combined with testing for human papilloma virus (HPV), every 5 years. Some types of HPV increase your risk of cervical cancer. Testing for HPV may also be done on women of any age with unclear Pap test results.  Other health care providers may not recommend any screening for nonpregnant women who are considered low risk for pelvic cancer and who do not have symptoms. Ask your health care provider if a screening pelvic exam is right for you.  If you have had past treatment for cervical cancer or a condition that could lead to cancer, you need Pap tests and screening for cancer for at least 20 years after your treatment. If Pap tests have been discontinued, your risk factors (such as having a new sexual partner) need to be reassessed to determine if screening should resume. Some women have medical problems that increase the chance of getting cervical cancer. In these cases, your health care provider may recommend more frequent screening and Pap tests.  Colorectal Cancer  This type of cancer can be detected and often prevented.  Routine colorectal cancer screening usually begins at 61 years of age and continues through 61 years of age.  Your health care provider may recommend screening at an earlier age if you have risk factors for colon cancer.  Your health care provider may also recommend using home test kits to check for hidden blood in the stool.  A small camera at the end of a tube can be used to examine your colon  directly (sigmoidoscopy or colonoscopy). This is done to check for the earliest forms of colorectal cancer.  Routine screening usually begins at age 50.  Direct examination of the colon should be repeated every 5-10 years through 61 years of age. However, you may need to be screened more often if early forms of precancerous polyps or small growths are found.  Skin Cancer  Check your skin from head to toe regularly.  Tell your health care provider about any new moles or changes in moles, especially if there is a change in a mole's shape or color.  Also tell your health care provider if you have a mole that is larger than the size of a pencil eraser.  Always use sunscreen. Apply sunscreen liberally and repeatedly throughout the day.  Protect yourself by wearing long sleeves, pants, a wide-brimmed hat, and sunglasses whenever you are outside.  Heart disease, diabetes, and high blood pressure  High blood pressure causes heart disease and   increases the risk of stroke. High blood pressure is more likely to develop in: ? People who have blood pressure in the high end of the normal range (130-139/85-89 mm Hg). ? People who are overweight or obese. ? People who are African American.  If you are 34-35 years of age, have your blood pressure checked every 3-5 years. If you are 32 years of age or older, have your blood pressure checked every year. You should have your blood pressure measured twice-once when you are at a hospital or clinic, and once when you are not at a hospital or clinic. Record the average of the two measurements. To check your blood pressure when you are not at a hospital or clinic, you can use: ? An automated blood pressure machine at a pharmacy. ? A home blood pressure monitor.  If you are between 25 years and 42 years old, ask your health care provider if you should take aspirin to prevent strokes.  Have regular diabetes screenings. This involves taking a blood sample to  check your fasting blood sugar level. ? If you are at a normal weight and have a low risk for diabetes, have this test once every three years after 61 years of age. ? If you are overweight and have a high risk for diabetes, consider being tested at a younger age or more often. Preventing infection Hepatitis B  If you have a higher risk for hepatitis B, you should be screened for this virus. You are considered at high risk for hepatitis B if: ? You were born in a country where hepatitis B is common. Ask your health care provider which countries are considered high risk. ? Your parents were born in a high-risk country, and you have not been immunized against hepatitis B (hepatitis B vaccine). ? You have HIV or AIDS. ? You use needles to inject street drugs. ? You live with someone who has hepatitis B. ? You have had sex with someone who has hepatitis B. ? You get hemodialysis treatment. ? You take certain medicines for conditions, including cancer, organ transplantation, and autoimmune conditions.  Hepatitis C  Blood testing is recommended for: ? Everyone born from [birth year] through 1964. ? Anyone with known risk factors for hepatitis C.  Sexually transmitted infections (STIs)  You should be screened for sexually transmitted infections (STIs) including gonorrhea and chlamydia if: ? You are sexually active and are younger than 61 years of age. ? You are older than 61 years of age and your health care provider tells you that you are at risk for this type of infection. ? Your sexual activity has changed since you were last screened and you are at an increased risk for chlamydia or gonorrhea. Ask your health care provider if you are at risk.  If you do not have HIV, but are at risk, it may be recommended that you take a prescription medicine daily to prevent HIV infection. This is called pre-exposure prophylaxis (PrEP). You are considered at risk if: ? You are sexually active and do not regularly  use condoms or know the HIV status of your partner(s). ? You take drugs by injection. ? You are sexually active with a partner who has HIV.  Talk with your health care provider about whether you are at high risk of being infected with HIV. If you choose to begin PrEP, you should first be tested for HIV. You should then be tested every 3 months for as long as you are taking  PrEP. Pregnancy  If you are premenopausal and you may become pregnant, ask your health care provider about preconception counseling.  If you may become pregnant, take 400 to 800 micrograms (mcg) of folic acid every day.  If you want to prevent pregnancy, talk to your health care provider about birth control (contraception). Osteoporosis and menopause  Osteoporosis is a disease in which the bones lose minerals and strength with aging. This can result in serious bone fractures. Your risk for osteoporosis can be identified using a bone density scan.  If you are 24 years of age or older, or if you are at risk for osteoporosis and fractures, ask your health care provider if you should be screened.  Ask your health care provider whether you should take a calcium or vitamin D supplement to lower your risk for osteoporosis.  Menopause may have certain physical symptoms and risks.  Hormone replacement therapy may reduce some of these symptoms and risks. Talk to your health care provider about whether hormone replacement therapy is right for you. Follow these instructions at home:  Schedule regular health, dental, and eye exams.  Stay current with your immunizations.  Do not use any tobacco products including cigarettes, chewing tobacco, or electronic cigarettes.  If you are pregnant, do not drink alcohol.  If you are breastfeeding, limit how much and how often you drink alcohol.  Limit alcohol intake to no more than 1 drink per day for nonpregnant women. One drink equals 12 ounces of beer, 5 ounces of wine, or 1 ounces  of hard liquor.  Do not use street drugs.  Do not share needles.  Ask your health care provider for help if you need support or information about quitting drugs.  Tell your health care provider if you often feel depressed.  Tell your health care provider if you have ever been abused or do not feel safe at home. This information is not intended to replace advice given to you by your health care provider. Make sure you discuss any questions you have with your health care provider. Document Released: 03/23/2011 Document Revised: 02/13/2016 Document Reviewed: 06/11/2015 Elsevier Interactive Patient Education  [employer].

## 2017-09-28 ENCOUNTER — Telehealth: Payer: Self-pay

## 2017-09-28 DIAGNOSIS — Z0000 Encounter for general adult medical examination without abnormal findings: Secondary | ICD-10-CM | POA: Insufficient documentation

## 2017-09-28 NOTE — Assessment & Plan Note (Signed)
Rx for phentermine for 1 month. She is trying whole 30 diet at this time. We talked about lack of efficacy of dieting and then returning to normal diet and about trying to make more long term changes which are smaller.

## 2017-09-28 NOTE — Progress Notes (Signed)
   Subjective:    Patient ID: Beth Rivers, female    DOB: 12-Dec-1955, 62 y.o.   MRN: [id number]  HPI The patient is a 62 YO female coming in for physical.   PMH, [facility], social history reviewed and updated.   Review of Systems  Constitutional: Negative.   HENT: Negative.   Eyes: Negative.   Respiratory: Negative for cough, chest tightness and shortness of breath.   Cardiovascular: Negative for chest pain, palpitations and leg swelling.  Gastrointestinal: Negative for abdominal distention, abdominal pain, constipation, diarrhea, nausea and vomiting.  Musculoskeletal: Negative.   Skin: Negative.   Neurological: Negative.   Psychiatric/Behavioral: Negative.       Objective:   Physical Exam  Constitutional: She is oriented to person, place, and time. She appears well-developed and well-nourished.  HENT:  Head: Normocephalic and atraumatic.  Eyes: EOM are normal.  Neck: Normal range of motion.  Cardiovascular: Normal rate and regular rhythm.  Pulmonary/Chest: Effort normal and breath sounds normal. No respiratory distress. She has no wheezes. She has no rales.  Abdominal: Soft. Bowel sounds are normal. She exhibits no distension. There is no tenderness. There is no rebound.  Musculoskeletal: She exhibits no edema.  Neurological: She is alert and oriented to person, place, and time. Coordination normal.  Skin: Skin is warm and dry.  Psychiatric: She has a normal mood and affect.   Vitals:   09/27/17 1508  BP: 110/72  Pulse: 82  Temp: 97.8 F (36.6 C)  TempSrc: Oral  SpO2: 98%  Weight: 209 lb (94.8 kg)  Height: 5' 2.75" (1.594 m)      Assessment & Plan:

## 2017-09-28 NOTE — Assessment & Plan Note (Signed)
Checking CMP and adjust hctz as needed.

## 2017-09-28 NOTE — Telephone Encounter (Signed)
PA started on CoverMyMeds KEY: [id number]

## 2017-09-28 NOTE — Assessment & Plan Note (Signed)
Checking labs, mammogram and pap smear up to date. Denies flu shot. Tetanus and colonoscopy up to date. Given screening recommendations. Counseled about sun safety and mole surveillance recommendations.

## 2017-09-29 ENCOUNTER — Other Ambulatory Visit: Payer: Self-pay | Admitting: Obstetrics & Gynecology

## 2017-09-29 DIAGNOSIS — Z1231 Encounter for screening mammogram for malignant neoplasm of breast: Secondary | ICD-10-CM

## 2017-10-01 ENCOUNTER — Ambulatory Visit: Payer: BLUE CROSS/BLUE SHIELD

## 2017-10-01 ENCOUNTER — Ambulatory Visit
Admission: RE | Admit: 2017-10-01 | Discharge: 2017-10-01 | Disposition: A | Discharge status: Home / Self Care | Payer: BLUE CROSS/BLUE SHIELD | Source: Ambulatory Visit | Attending: Obstetrics & Gynecology | Admitting: Obstetrics & Gynecology

## 2017-10-01 DIAGNOSIS — Z1231 Encounter for screening mammogram for malignant neoplasm of breast: Secondary | ICD-10-CM | POA: Diagnosis not present

## 2017-10-01 NOTE — Telephone Encounter (Signed)
PA approved 09/28/2017-09/27/2018  Member is limited to 12 weeks of therapy per year

## 2017-10-04 ENCOUNTER — Encounter (HOSPITAL_BASED_OUTPATIENT_CLINIC_OR_DEPARTMENT_OTHER): Payer: Self-pay

## 2017-10-04 DIAGNOSIS — R0902 Hypoxemia: Secondary | ICD-10-CM

## 2017-10-04 DIAGNOSIS — G4733 Obstructive sleep apnea (adult) (pediatric): Secondary | ICD-10-CM

## 2017-10-22 ENCOUNTER — Ambulatory Visit (HOSPITAL_BASED_OUTPATIENT_CLINIC_OR_DEPARTMENT_OTHER): Payer: BLUE CROSS/BLUE SHIELD | Attending: Internal Medicine | Admitting: Internal Medicine

## 2017-10-22 DIAGNOSIS — G4733 Obstructive sleep apnea (adult) (pediatric): Secondary | ICD-10-CM | POA: Insufficient documentation

## 2017-10-22 DIAGNOSIS — G4736 Sleep related hypoventilation in conditions classified elsewhere: Secondary | ICD-10-CM | POA: Insufficient documentation

## 2017-10-22 DIAGNOSIS — R0902 Hypoxemia: Secondary | ICD-10-CM

## 2017-11-01 NOTE — Procedures (Signed)
   NAME: Beth Rivers DATE OF BIRTH:  Sep 08, 1956 MEDICAL RECORD NUMBER [id number]  LOCATION: [facility]  PHYSICIAN: Marius Ditch  DATE OF STUDY: 10/22/2017  SLEEP STUDY TYPE: Positive Airway Pressure Titration               REFERRING PHYSICIAN: Marius Ditch, MD  INDICATION FOR STUDY: Patient on auto adjusting CPAP and persisting hypoxemia. Study done in lab to be sure hypoxemia is resolved.   EPWORTH SLEEPINESS SCORE:   NA HEIGHT: 5\' 2"  (157.5 cm)  WEIGHT: 200 lb (90.7 kg)    Body mass index is 36.58 kg/m.  NECK SIZE: 15 in.  MEDICATIONS Patient self administered medications include: N/A. Medications administered during study include No sleep medicine administered.Marland Kitchen  SLEEP STUDY TECHNIQUE The patient underwent an attended overnight polysomnography titration to assess the effects of cpap therapy. The following variables were monitored: EEG(C4-A1, C3-A2, O1-A2, O2-A1), EOG, submental and leg EMG, ECG, oxyhemoglobin saturation by pulse oximetry, thoracic and abdominal respiratory effort belts, nasal/oral airflow by pressure sensor, body position sensor and snoring sensor. CPAP pressure was titrated to eliminate apneas, hypopneas and oxygen desaturation.  TECHNICAL COMMENTS Comments added by Technician: PATIENT TOLERATED THE MASK WELL Comments added by Scorer: N/A  SLEEP ARCHITECTURE The study was initiated at 10:48:40 PM and terminated at 4:46:02 AM. Total recorded time was 357.4 minutes. EEG confirmed total sleep time was 331.6 minutes yielding a sleep efficiency of 92.8%. Sleep onset after lights out was 14.8 minutes with a REM latency of 149.5 minutes. The patient spent 3.32% of the night in stage N1 sleep, 78.44% in stage N2 sleep, 0.00% in stage N3 and 18.25% in REM. The Arousal Index was 7.1/hour.  RESPIRATORY PARAMETERS The overall AHI was 3.1 per hour, and the RDI was 3.1 events/hour with a central apnea index of 0.0per hour. The most appropriate  setting of CPAP was IPAP/EPAP 11/11 cm H2O. At this setting, the sleep efficiency was 97% and the patient was supine for 59%. The AHI was 0.7 events per hour, and the RDI was 0.7 events/hour (with 0.0 central events) and the arousal index was 5.2 per hour.The oxygen nadir was 76.0% during sleep, however, with the final pressure, her oxygen saturation remained above 89%.  LEG MOVEMENT DATA The total leg movements were with a resulting leg movement index of 0.5/hr. Associated arousal with leg movement index was 0.5/hr.  CARDIAC DATA The underlying cardiac rhythm was most consistent with sinus rhythm. Mean heart rate during sleep was 60.79 bpm. Additional rhythm abnormalities include None.  IMPRESSIONS - Significant Obstructive Sleep apnea(OSA) Optimal pressure attained with control of hypoxemia.   DIAGNOSIS - Obstructive Sleep Apnea (327.23 [G47.33 ICD-10]  RECOMMENDATIONS - Continued use of auto CPAP therapy. We will make sure 95%ile pressure is approximately 11 cm H2O   Marius Ditch Sleep specialist, American Board of Internal Medicine  ELECTRONICALLY SIGNED ON:  11/01/2017, 9:06 PM [place] PH: [phone]   FX: [phone] [place]

## 2017-12-30 DIAGNOSIS — G4733 Obstructive sleep apnea (adult) (pediatric): Secondary | ICD-10-CM | POA: Diagnosis not present

## 2018-01-12 DIAGNOSIS — M79671 Pain in right foot: Secondary | ICD-10-CM | POA: Diagnosis not present

## 2018-01-12 DIAGNOSIS — L600 Ingrowing nail: Secondary | ICD-10-CM | POA: Diagnosis not present

## 2018-01-12 DIAGNOSIS — M2011 Hallux valgus (acquired), right foot: Secondary | ICD-10-CM | POA: Diagnosis not present

## 2018-01-29 DIAGNOSIS — G4733 Obstructive sleep apnea (adult) (pediatric): Secondary | ICD-10-CM | POA: Diagnosis not present

## 2018-01-31 DIAGNOSIS — M25774 Osteophyte, right foot: Secondary | ICD-10-CM | POA: Diagnosis not present

## 2018-01-31 DIAGNOSIS — L600 Ingrowing nail: Secondary | ICD-10-CM | POA: Diagnosis not present

## 2018-02-01 ENCOUNTER — Telehealth: Payer: Self-pay

## 2018-02-01 DIAGNOSIS — M79676 Pain in unspecified toe(s): Secondary | ICD-10-CM

## 2018-02-01 NOTE — Telephone Encounter (Signed)
She was told she broke her big toe a long time ago and the nail bed is growing around bone and was told the only way to correct it was surgery and wanted another opinion before she went through with it. She does not know of a particular office she wants to go to just that she wants to be referred to an orthopedic surgeon.

## 2018-02-01 NOTE — Telephone Encounter (Signed)
Is there a particular one they want her to see or she wants to see?

## 2018-02-01 NOTE — Telephone Encounter (Signed)
Copied from [place] [id number]. Topic: Referral - Request >> Feb 01, 2018  8:34 AM Vernona Rieger wrote: Reason for CRM: Patient needs a referral to a orthopedic surgeon. She said she saw a podiatrist yesterday and possibly needs surgery on her big toe. Please advise. [phone]

## 2018-02-01 NOTE — Telephone Encounter (Signed)
Referral placed.

## 2018-02-01 NOTE — Telephone Encounter (Signed)
Patient informed. 

## 2018-03-01 DIAGNOSIS — G4733 Obstructive sleep apnea (adult) (pediatric): Secondary | ICD-10-CM | POA: Diagnosis not present

## 2018-03-03 ENCOUNTER — Ambulatory Visit (INDEPENDENT_AMBULATORY_CARE_PROVIDER_SITE_OTHER): Payer: BLUE CROSS/BLUE SHIELD | Admitting: Orthopedic Surgery

## 2018-03-29 ENCOUNTER — Encounter (INDEPENDENT_AMBULATORY_CARE_PROVIDER_SITE_OTHER): Payer: Self-pay | Admitting: Orthopedic Surgery

## 2018-03-29 ENCOUNTER — Ambulatory Visit (INDEPENDENT_AMBULATORY_CARE_PROVIDER_SITE_OTHER): Payer: BLUE CROSS/BLUE SHIELD | Admitting: Orthopedic Surgery

## 2018-03-29 ENCOUNTER — Ambulatory Visit (INDEPENDENT_AMBULATORY_CARE_PROVIDER_SITE_OTHER): Payer: Self-pay

## 2018-03-29 ENCOUNTER — Ambulatory Visit (INDEPENDENT_AMBULATORY_CARE_PROVIDER_SITE_OTHER): Payer: BLUE CROSS/BLUE SHIELD

## 2018-03-29 VITALS — Ht 62.0 in | Wt 200.0 lb

## 2018-03-29 DIAGNOSIS — L600 Ingrowing nail: Secondary | ICD-10-CM

## 2018-03-29 DIAGNOSIS — M25462 Effusion, left knee: Secondary | ICD-10-CM

## 2018-03-29 DIAGNOSIS — M79674 Pain in right toe(s): Secondary | ICD-10-CM

## 2018-03-29 DIAGNOSIS — M25562 Pain in left knee: Secondary | ICD-10-CM

## 2018-03-29 MED ORDER — METHYLPREDNISOLONE ACETATE 40 MG/ML IJ SUSP
40.0000 mg | INTRAMUSCULAR | Status: AC | PRN
  Administered 2018-03-29: 40 mg via INTRA_ARTICULAR

## 2018-03-29 MED ORDER — LIDOCAINE HCL 1 % IJ SOLN
5.0000 mL | INTRAMUSCULAR | Status: AC | PRN
  Administered 2018-03-29: 5 mL

## 2018-03-29 NOTE — Progress Notes (Signed)
Office Visit Note   Patient: Beth Rivers           Date of Birth: 1955/12/13           MRN: [id number] Visit Date: 03/29/2018              Requested by: Hoyt Koch, MD [place], [place] [id number] PCP: Hoyt Koch, MD  Chief Complaint  Patient presents with  . Left Knee - Pain  . Right Foot - Pain      HPI: Patient is a 62 year old woman who presents with lateral left joint line pain as well as pain from ingrown toenail of the right great toe.  Patient states that she was told she had a fracture of the right great toe when needed surgical intervention by podiatry.  Patient states that her left knee will give way while standing.  She denies any falling.  She states she has swelling but minimal pain.  Assessment & Plan: Visit Diagnoses:  1. Great toe pain, right   2. Pain and swelling of left knee   3. Ingrowing nail, right great toe     Plan: Left knee was injected and the medial border of the right great toenail was excised.  Reevaluate in 4 weeks for further treatment of the left knee and right great toe.  Follow-Up Instructions: Return in about 1 month (around 04/26/2018).   Ortho Exam  Patient is alert, oriented, no adenopathy, well-dressed, normal affect, normal respiratory effort. Examination patient has normal gait she has good dorsalis pedis pulses.  Examination of left knee there is no swelling she is tender to palpation over the lateral joint line collaterals and cruciates are stable.  There is crepitation of patellofemoral joint with range of motion.  She has slight valgus alignment.  Examination of the right great toe she has an ingrown painful great toenail over the medial border there is no paronychial infection.  Patient has good range of motion of the toe no instability no hallux rigidus.  After informed consent and sterile prepping patient underwent a digital block for the right great toe with 10 cc of 1% lidocaine plain.  After  adequate levels of anesthesia were obtained the medial border of the great toenail was excised there was no signs of infection sterile dressing was applied patient was placed in a postoperative shoe she will start antibiotic ointment dressing changes tomorrow.  Left knee was injected without complications.  Imaging: Xr Knee 1-2 Views Left  Result Date: 03/29/2018 2 view radiographs of the left knee shows bony spurs in the lateral joint line no subcondylar cysts or sclerosis.  Mild spurring in the patellofemoral joint.  Xr Toe Great Right  Result Date: 03/29/2018 2 view radiographs of the right great toe shows no evidence of a fracture no destructive changes of the bone no signs of infection.  No images are attached to the encounter.  Labs: Lab Results  Component Value Date   HGBA1C 5.3 09/27/2017   LABORGA NO GROWTH 01/25/2015     Lab Results  Component Value Date   ALBUMIN 4.2 09/27/2017   ALBUMIN 3.9 02/26/2016   ALBUMIN 4.5 01/28/2016    Body mass index is 36.58 kg/m.  Orders:  Orders Placed This Encounter  Procedures  . Large Joint Inj  . XR Toe Great Right  . XR Knee 1-2 Views Left   No orders of the defined types were placed in this encounter.    Procedures: Large  Joint Inj: L knee on 03/29/2018 1:39 PM Indications: pain and diagnostic evaluation Details: 22 G 1.5 in needle, anteromedial approach  Arthrogram: No  Medications: 5 mL lidocaine 1 %; 40 mg methylPREDNISolone acetate 40 MG/ML Outcome: tolerated well, no immediate complications Procedure, treatment alternatives, risks and benefits explained, specific risks discussed. Consent was given by the patient. Immediately prior to procedure a time out was called to verify the correct patient, procedure, equipment, support staff and site/side marked as required. Patient was prepped and draped in the usual sterile fashion.      Clinical Data: No additional findings.  ROS:  All other systems negative, except  as noted in the HPI. Review of Systems  Objective: Vital Signs: Ht 5\' 2"  (1.575 m)   Wt 200 lb (90.7 kg)   LMP 09/22/2003   BMI 36.58 kg/m   Specialty Comments:  No specialty comments available.  PMFS History: Patient Active Problem List   Diagnosis Date Noted  . Routine general medical examination at a health care facility 09/28/2017  . Obesity 11/22/2015  . Essential hypertension 04/30/2015  . Situational depression 11/27/2013  . Fibroids 11/27/2013   Past Medical History:  Diagnosis Date  . Depression 09/2001  . Elevated BP   . Elevated LDL cholesterol level 1/14  . History of uterine fibroid 11/2002    Family History  Problem Relation Age of Onset  . Hypertension Mother   . Hypertension Father   . Heart attack Father   . Sudden death Daughter   . Colon cancer Maternal Grandmother     History reviewed. No pertinent surgical history. Social History   Occupational History  . Not on file  Tobacco Use  . Smoking status: Never Smoker  . Smokeless tobacco: Never Used  Substance and Sexual Activity  . Alcohol use: Yes    Alcohol/week: 0.6 oz    Types: 1 Glasses of wine per week    Comment: glass of wine weekly, if that  . Drug use: No  . Sexual activity: Yes    Partners: Male

## 2018-05-02 ENCOUNTER — Ambulatory Visit (INDEPENDENT_AMBULATORY_CARE_PROVIDER_SITE_OTHER): Payer: BLUE CROSS/BLUE SHIELD | Admitting: Orthopedic Surgery

## 2018-05-09 ENCOUNTER — Ambulatory Visit (INDEPENDENT_AMBULATORY_CARE_PROVIDER_SITE_OTHER): Payer: BLUE CROSS/BLUE SHIELD | Admitting: Orthopedic Surgery

## 2018-05-31 ENCOUNTER — Ambulatory Visit (INDEPENDENT_AMBULATORY_CARE_PROVIDER_SITE_OTHER): Payer: BLUE CROSS/BLUE SHIELD | Admitting: Obstetrics & Gynecology

## 2018-05-31 ENCOUNTER — Other Ambulatory Visit: Payer: Self-pay

## 2018-05-31 ENCOUNTER — Other Ambulatory Visit (HOSPITAL_COMMUNITY)
Admission: RE | Admit: 2018-05-31 | Discharge: 2018-05-31 | Disposition: A | Discharge status: Home / Self Care | Payer: BLUE CROSS/BLUE SHIELD | Source: Ambulatory Visit | Attending: Obstetrics & Gynecology | Admitting: Obstetrics & Gynecology

## 2018-05-31 ENCOUNTER — Encounter

## 2018-05-31 ENCOUNTER — Encounter: Payer: Self-pay | Admitting: Obstetrics & Gynecology

## 2018-05-31 VITALS — BP 128/78 | HR 96 | Resp 18 | Ht 63.0 in | Wt 214.0 lb

## 2018-05-31 DIAGNOSIS — Z124 Encounter for screening for malignant neoplasm of cervix: Secondary | ICD-10-CM | POA: Insufficient documentation

## 2018-05-31 DIAGNOSIS — Z0000 Encounter for general adult medical examination without abnormal findings: Secondary | ICD-10-CM | POA: Diagnosis not present

## 2018-05-31 DIAGNOSIS — E2839 Other primary ovarian failure: Secondary | ICD-10-CM

## 2018-05-31 DIAGNOSIS — Z01419 Encounter for gynecological examination (general) (routine) without abnormal findings: Secondary | ICD-10-CM

## 2018-05-31 NOTE — Progress Notes (Signed)
62 y.o. G2P2001 UnknownCaucasianF here for annual exam.  Doing well.  Frustrated with weight.  Feels like she cannot control her eating.  Took phentermine earlier this year.  This raised her blood pressure so she stopped it.  Has been diagnosed with sleep apnea.  Is on CPAP and using this faithfully.    Patient's last menstrual period was 09/22/2003.          Sexually active: No.  The current method of family planning is post menopausal status.    Exercising: Yes.    walking Smoker:  no  Health Maintenance:  Pap:  01/25/15 Neg. HR HPV:neg  History of abnormal Pap:  Yes, remote hx MMG:  10/01/17 BIRADS1:neg  Colonoscopy:  2017 normal.  [facility]. BMD:   Never TDaP: 05/2009  Pneumonia vaccine(s):  n/a Shingrix:   3/19, 8/19 Hep C testing: 01/28/16 Neg  Screening Labs: will come back fasting    reports that she has never smoked. She has never used smokeless tobacco. She reports that she drinks about 1.0 standard drinks of alcohol per week. She reports that she does not use drugs.  Past Medical History:  Diagnosis Date  . Depression 09/2001  . Elevated BP   . Elevated LDL cholesterol level 1/14  . History of uterine fibroid 11/2002  . Obstructive sleep apnea     History reviewed. No pertinent surgical history.  Current Outpatient Medications  Medication Sig Dispense Refill  . triamterene-hydrochlorothiazide (MAXZIDE-25) 37.5-25 MG tablet Take 1 tablet by mouth daily. 30 tablet 6   No current facility-administered medications for this visit.     Family History  Problem Relation Age of Onset  . Hypertension Mother   . Hypertension Father   . Heart attack Father   . Sudden death Daughter   . Colon cancer Maternal Grandmother     Review of Systems  Constitutional:       Weight gain   Gastrointestinal:       Bloating Change in quality/ character of stools   Genitourinary:       Loss of urine spontaneously  Loss os urine with sneeze or cough   All other systems reviewed and are  negative.   Exam:   BP 128/78 (BP Location: Right Arm, Patient Position: Sitting, Cuff Size: Large)   Pulse 96   Resp 18   Ht 5\' 3"  (1.6 m)   Wt 214 lb (97.1 kg)   LMP 09/22/2003   BMI 37.91 kg/m    Height: 5\' 3"  (160 cm)  Ht Readings from Last 3 Encounters:  05/31/18 5\' 3"  (1.6 m)  03/29/18 5\' 2"  (1.575 m)  10/22/17 5\' 2"  (1.575 m)    General appearance: alert, cooperative and appears stated age Head: Normocephalic, without obvious abnormality, atraumatic Neck: no adenopathy, supple, symmetrical, trachea midline and thyroid normal to inspection and palpation Lungs: clear to auscultation bilaterally Breasts: normal appearance, no masses or tenderness Heart: regular rate and rhythm Abdomen: soft, non-tender; bowel sounds normal; no masses,  no organomegaly Extremities: extremities normal, atraumatic, no cyanosis or edema Skin: Skin color, texture, turgor normal. No rashes or lesions Lymph nodes: Cervical, supraclavicular, and axillary nodes normal. No abnormal inguinal nodes palpated Neurologic: Grossly normal   Pelvic: External genitalia:  no lesions              Urethra:  normal appearing urethra with no masses, tenderness or lesions              Bartholins and Skenes: normal  Vagina: normal appearing vagina with normal color and discharge, no lesions              Cervix: no lesions              Pap taken: Yes.   Bimanual Exam:  Uterus:  normal size, contour, position, consistency, mobility, non-tender              Adnexa: normal adnexa and no mass, fullness, tenderness               Rectovaginal: Confirms               Anus:  normal sphincter tone, no lesions  Chaperone was present for exam.  A:  Well Woman with normal exam PMP, no HRT Hypertension Obesity Family hx of suicide (daughter)  P:   Mammogram guidelines reviewed.  Doing 3D pap smear and HR HPV obtained today Will return for fasting labs:  Orders placed Referral to Dr. Leafy Ro.    Return annually or prn

## 2018-06-02 LAB — CYTOLOGY - PAP
Adequacy: ABSENT
DIAGNOSIS: NEGATIVE
HPV: NOT DETECTED

## 2018-06-03 ENCOUNTER — Other Ambulatory Visit: Payer: Self-pay | Admitting: Obstetrics & Gynecology

## 2018-06-03 DIAGNOSIS — Z1231 Encounter for screening mammogram for malignant neoplasm of breast: Secondary | ICD-10-CM

## 2018-06-20 ENCOUNTER — Encounter: Payer: Self-pay | Admitting: Internal Medicine

## 2018-06-20 ENCOUNTER — Telehealth: Payer: Self-pay | Admitting: Internal Medicine

## 2018-06-20 NOTE — Telephone Encounter (Signed)
Copied from [place] [phone]. Topic: Quick Communication - Rx Refill/Question >> Jun 20, 2018 12:56 PM Sheppard Coil, [employer] wrote: Medication:  Bimatoprost Ophthalmic eye drop.  Pt states she has never been on it before but wants it prescribed because she is starting to loose her eyelashes with menopause.  Has the patient contacted their pharmacy? No - never had medication before.  Wants to know if doctor will just RX without coming in for OV. (Agent: If no, request that the patient contact the pharmacy for the refill.) (Agent: If yes, when and what did the pharmacy advise?)  Preferred Pharmacy (with phone number or street name): [facility], [place] [phone] (Phone) [phone] (Fax)  Agent: Please be advised that RX refills may take up to 3 business days. We ask that you follow-up with your pharmacy.

## 2018-06-21 DIAGNOSIS — G4733 Obstructive sleep apnea (adult) (pediatric): Secondary | ICD-10-CM | POA: Diagnosis not present

## 2018-06-21 NOTE — Telephone Encounter (Signed)
She would need to contact her eye doctor about an eye medication. I do not prescribe any medications for eyelashes.

## 2018-06-21 NOTE — Telephone Encounter (Signed)
Spoke with pt to inform.  

## 2018-06-22 DIAGNOSIS — G4733 Obstructive sleep apnea (adult) (pediatric): Secondary | ICD-10-CM | POA: Diagnosis not present

## 2018-06-27 DIAGNOSIS — Z23 Encounter for immunization: Secondary | ICD-10-CM | POA: Diagnosis not present

## 2018-06-30 ENCOUNTER — Encounter (INDEPENDENT_AMBULATORY_CARE_PROVIDER_SITE_OTHER): Payer: BLUE CROSS/BLUE SHIELD

## 2018-07-11 ENCOUNTER — Encounter (INDEPENDENT_AMBULATORY_CARE_PROVIDER_SITE_OTHER): Payer: Self-pay | Admitting: Family Medicine

## 2018-07-11 ENCOUNTER — Ambulatory Visit (INDEPENDENT_AMBULATORY_CARE_PROVIDER_SITE_OTHER): Payer: BLUE CROSS/BLUE SHIELD | Admitting: Family Medicine

## 2018-07-11 VITALS — BP 150/79 | HR 66 | Temp 98.1°F | Ht 63.0 in | Wt 220.0 lb

## 2018-07-11 DIAGNOSIS — Z9189 Other specified personal risk factors, not elsewhere classified: Secondary | ICD-10-CM | POA: Diagnosis not present

## 2018-07-11 DIAGNOSIS — Z0289 Encounter for other administrative examinations: Secondary | ICD-10-CM

## 2018-07-11 DIAGNOSIS — R0602 Shortness of breath: Secondary | ICD-10-CM | POA: Diagnosis not present

## 2018-07-11 DIAGNOSIS — F3289 Other specified depressive episodes: Secondary | ICD-10-CM | POA: Diagnosis not present

## 2018-07-11 DIAGNOSIS — I10 Essential (primary) hypertension: Secondary | ICD-10-CM

## 2018-07-11 DIAGNOSIS — R5383 Other fatigue: Secondary | ICD-10-CM

## 2018-07-11 DIAGNOSIS — Z6838 Body mass index (BMI) 38.0-38.9, adult: Secondary | ICD-10-CM

## 2018-07-12 LAB — CBC WITH DIFFERENTIAL
BASOS ABS: 0.1 10*3/uL (ref 0.0–0.2)
Basos: 1 %
EOS (ABSOLUTE): 0.1 10*3/uL (ref 0.0–0.4)
Eos: 2 %
HEMOGLOBIN: 14.3 g/dL (ref 11.1–15.9)
Hematocrit: 42.8 % (ref 34.0–46.6)
Immature Grans (Abs): 0 10*3/uL (ref 0.0–0.1)
Immature Granulocytes: 0 %
LYMPHS ABS: 1.7 10*3/uL (ref 0.7–3.1)
Lymphs: 27 %
MCH: 31.4 pg (ref 26.6–33.0)
MCHC: 33.4 g/dL (ref 31.5–35.7)
MCV: 94 fL (ref 79–97)
Monocytes Absolute: 0.5 10*3/uL (ref 0.1–0.9)
Monocytes: 8 %
NEUTROS ABS: 3.9 10*3/uL (ref 1.4–7.0)
NEUTROS PCT: 62 %
RBC: 4.56 x10E6/uL (ref 3.77–5.28)
RDW: 13.7 % (ref 12.3–15.4)
WBC: 6.2 10*3/uL (ref 3.4–10.8)

## 2018-07-12 LAB — COMPREHENSIVE METABOLIC PANEL
ALBUMIN: 4.2 g/dL (ref 3.6–4.8)
ALK PHOS: 92 IU/L (ref 39–117)
ALT: 22 IU/L (ref 0–32)
AST: 22 IU/L (ref 0–40)
Albumin/Globulin Ratio: 1.6 (ref 1.2–2.2)
BUN / CREAT RATIO: 35 — AB (ref 12–28)
BUN: 18 mg/dL (ref 8–27)
CHLORIDE: 105 mmol/L (ref 96–106)
CO2: 24 mmol/L (ref 20–29)
Calcium: 8.7 mg/dL (ref 8.7–10.3)
Creatinine, Ser: 0.52 mg/dL — ABNORMAL LOW (ref 0.57–1.00)
GFR calc Af Amer: 119 mL/min/{1.73_m2} (ref >59)
GFR calc non Af Amer: 103 mL/min/{1.73_m2} (ref >59)
GLUCOSE: 90 mg/dL (ref 65–99)
Globulin, Total: 2.7 g/dL (ref 1.5–4.5)
POTASSIUM: 4.2 mmol/L (ref 3.5–5.2)
Sodium: 143 mmol/L (ref 134–144)
Total Protein: 6.9 g/dL (ref 6.0–8.5)

## 2018-07-12 LAB — T3: T3 TOTAL: 119 ng/dL (ref 71–180)

## 2018-07-12 LAB — LIPID PANEL WITH LDL/HDL RATIO
CHOLESTEROL TOTAL: 220 mg/dL — AB (ref 100–199)
HDL: 39 mg/dL — ABNORMAL LOW (ref >39)
LDL CALC: 118 mg/dL — AB (ref 0–99)
LDL/HDL RATIO: 3 ratio (ref 0.0–3.2)
Triglycerides: 316 mg/dL — ABNORMAL HIGH (ref 0–149)
VLDL CHOLESTEROL CAL: 63 mg/dL — AB (ref 5–40)

## 2018-07-12 LAB — TSH: TSH: 2.26 u[IU]/mL (ref 0.450–4.500)

## 2018-07-12 LAB — INSULIN, RANDOM: INSULIN: 21 u[IU]/mL (ref 2.6–24.9)

## 2018-07-12 LAB — HEMOGLOBIN A1C
ESTIMATED AVERAGE GLUCOSE: 103 mg/dL
HEMOGLOBIN A1C: 5.2 % (ref 4.8–5.6)

## 2018-07-12 LAB — T4, FREE: FREE T4: 1.05 ng/dL (ref 0.82–1.77)

## 2018-07-12 LAB — FOLATE: Folate: 8.1 ng/mL (ref >3.0)

## 2018-07-12 LAB — VITAMIN B12: Vitamin B-12: 523 pg/mL (ref 232–1245)

## 2018-07-12 LAB — VITAMIN D 25 HYDROXY (VIT D DEFICIENCY, FRACTURES): Vit D, 25-Hydroxy: 16.1 ng/mL — ABNORMAL LOW (ref 30.0–100.0)

## 2018-07-13 NOTE — Progress Notes (Signed)
Office: [phone]  /  Fax: [phone]   Dear Dr. Sabra Heck,   Thank you for referring Beth Rivers to our clinic. The following note includes my evaluation and treatment recommendations.  HPI:   Chief Complaint: OBESITY    [name] has been referred by Megan Salon, MD for consultation regarding her obesity and obesity related comorbidities.    Beth Rivers (MR# [id number]) is a 62 y.o. female who presents on 07/13/2018 for obesity evaluation and treatment. Current BMI is Body mass index is 38.97 kg/m.Beth Rivers has been struggling with her weight for many years and has been unsuccessful in either losing weight, maintaining weight loss, or reaching her healthy weight goal. Beth Rivers has done multiple extreme diets and she always gains her weight back.     [name] attended our information session and states she is currently in the action stage of change and ready to dedicate time achieving and maintaining a healthier weight. [name] is interested in becoming our patient and working on intensive lifestyle modifications including (but not limited to) diet, exercise and weight loss.    Beth Rivers states her desired weight loss is 73 lbs she started gaining weight in the last 6 yrs her heaviest weight ever was 213 lbs. she has significant food cravings issues  she is frequently drinking liquids with calories she has problems with excessive hunger  she frequently eats larger portions than normal  she has binge eating behaviors she struggles with emotional eating    Fatigue Beth Rivers feels her energy is lower than it should be. This has worsened with weight gain and has not worsened recently. Beth Rivers admits to daytime somnolence and admits to waking up still tired. Patient has a history of obstructive sleep apnea which may contribute to her fatigue. Beth Rivers has a history of symptoms of daytime fatigue, morning fatigue and hypertension. Patient generally gets 6 or 7 hours of sleep per night, and states they  generally have restless sleep. Snoring is present without CPAP. Apneic episodes are present without CPAP. Epworth Sleepiness Score is 8  Dyspnea on exertion Kare notes increasing shortness of breath with exercising and seems to be worsening over time with weight gain. She notes getting out of breath sooner with activity than she used to. This has not gotten worse recently. admits orthopnea.  Hypertension Beth Rivers is a 62 y.o. female with hypertension. Her blood pressure is elevated today at 150/79 and she states her blood pressure is normally controlled. Beth Rivers is somewhat nervous today. Beth Rivers denies chest pain. She is attempting to work on weight loss to help control her blood pressure with the goal of decreasing her risk of heart attack and stroke. Beth Rivers blood pressure is not currently controlled.  At risk for cardiovascular disease Beth Rivers is at a higher than average risk for cardiovascular disease due to obesity and hypertension. She currently denies any chest pain.  Depression with emotional eating behaviors Beth Rivers is not on medications and she has a PHQ-9 score of 16. She has been in a very toxic marriage and with the death of her daughter approximately seven years ago; she feels lonely with low self esteem. Beth Rivers recognizes she uses food for comfort and she is likely frustrated with herself. Beth Rivers struggles with emotional eating and using food for comfort to the extent that it is negatively impacting her health. She often snacks when she is not hungry. Beth Rivers sometimes feels she is out of control and then feels guilty that she made poor food choices. She is  attempting to work on behavior modification techniques to help reduce her emotional eating. She shows no sign of suicidal or homicidal ideations.  Depression Screen Beth Rivers's Food and Mood (modified PHQ-9) score was  Depression screen PHQ 2/9 07/11/2018  Decreased Interest 3  Down, Depressed, Hopeless 2  PHQ - 2 Score 5  Altered  sleeping 3  Tired, decreased energy 3  Change in appetite 3  Feeling bad or failure about yourself  2  Trouble concentrating 0  Moving slowly or fidgety/restless 0  Suicidal thoughts 0  PHQ-9 Score 16  Difficult doing work/chores Not difficult at all    ALLERGIES: No Known Allergies  MEDICATIONS: Current Outpatient Medications on File Prior to Visit  Medication Sig Dispense Refill  . triamterene-hydrochlorothiazide (MAXZIDE-25) 37.5-25 MG tablet Take 1 tablet by mouth daily. 30 tablet 6   No current facility-administered medications on file prior to visit.     PAST MEDICAL HISTORY: Past Medical History:  Diagnosis Date  . Anxiety   . Depression 09/2001  . Elevated BP   . Elevated LDL cholesterol level 1/14  . Feeling lonely   . History of uterine fibroid 11/2002  . HTN (hypertension)   . Joint pain   . Leg edema   . Obesity   . Obstructive sleep apnea     PAST SURGICAL HISTORY: History reviewed. No pertinent surgical history.  SOCIAL HISTORY: Social History   Tobacco Use  . Smoking status: Never Smoker  . Smokeless tobacco: Never Used  Substance Use Topics  . Alcohol use: Yes    Alcohol/week: 1.0 standard drinks    Types: 1 Glasses of wine per week  . Drug use: No    FAMILY HISTORY: Family History  Problem Relation Age of Onset  . Hypertension Mother   . Hyperlipidemia Mother   . Depression Mother   . Obesity Mother   . Hypertension Father   . Heart attack Father   . Obesity Father   . Hyperlipidemia Father   . Heart disease Father   . Sudden death Daughter   . Colon cancer Maternal Grandmother     ROS: Review of Systems  Constitutional: Positive for malaise/fatigue.  HENT: Positive for congestion (nasal stuffiness).   Eyes:       + Wear Glasses or Contacts  Cardiovascular: Positive for orthopnea. Negative for chest pain.       + Sudden Awakening from Sleep with Shortness of Breath  Gastrointestinal: Positive for diarrhea.  Musculoskeletal:        + Muscle or Joint Pain (knee)  Skin:       + Dryness  Psychiatric/Behavioral: Positive for depression. Negative for suicidal ideas. The patient has insomnia.        + Stress    PHYSICAL EXAM: Blood pressure (!) 150/79, pulse 66, temperature 98.1 F (36.7 C), temperature source Oral, height 5\' 3"  (1.6 m), weight 220 lb (99.8 kg), last menstrual period 09/22/2003, SpO2 98 %. Body mass index is 38.97 kg/m. Physical Exam  Constitutional: She is oriented to person, place, and time. She appears well-developed and well-nourished.  HENT:  Head: Normocephalic and atraumatic.  Nose: Nose normal.  Eyes: EOM are normal. No scleral icterus.  Neck: Normal range of motion. Neck supple. No thyromegaly present.  Cardiovascular: Normal rate and regular rhythm.  Pulmonary/Chest: Effort normal. No respiratory distress.  Abdominal: Soft. There is no tenderness.  + Obesity  Musculoskeletal: Normal range of motion.  Range of Motion normal in all 4 extremities  Neurological: She  is alert and oriented to person, place, and time. Coordination normal.  Skin: Skin is warm and dry.  Psychiatric: She has a normal mood and affect.  Vitals reviewed.   RECENT LABS AND TESTS: BMET    Component Value Date/Time   NA 143 07/11/2018 1404   K 4.2 07/11/2018 1404   K 3.9 02/26/2016 1522   CL 105 07/11/2018 1404   CL 103 02/26/2016 1522   CO2 24 07/11/2018 1404   CO2 26 02/26/2016 1522   GLUCOSE 90 07/11/2018 1404   GLUCOSE 99 09/27/2017 1549   BUN 18 07/11/2018 1404   CREATININE 0.52 (L) 07/11/2018 1404   CREATININE 0.64 02/26/2016 1522   CREATININE 0.84 01/28/2016 1035   CALCIUM 8.7 07/11/2018 1404   CALCIUM 8.8 02/26/2016 1522   GFRNONAA 103 07/11/2018 1404   GFRAA 119 07/11/2018 1404   Lab Results  Component Value Date   HGBA1C 5.2 07/11/2018   Lab Results  Component Value Date   INSULIN 21.0 07/11/2018   CBC    Component Value Date/Time   WBC 6.2 07/11/2018 1404   WBC 9.9  09/27/2017 1549   RBC 4.56 07/11/2018 1404   RBC 4.90 09/27/2017 1549   HGB 14.3 07/11/2018 1404   HGB 15.9 02/26/2016 1522   HCT 42.8 07/11/2018 1404   HCT 45.5 02/26/2016 1522   PLT 253.0 09/27/2017 1549   PLT 236 02/26/2016 1522   MCV 94 07/11/2018 1404   MCV 93 02/26/2016 1522   MCH 31.4 07/11/2018 1404   MCH 32.6 02/26/2016 1522   MCH 32.4 01/28/2016 1035   MCHC 33.4 07/11/2018 1404   MCHC 34.3 09/27/2017 1549   RDW 13.7 07/11/2018 1404   RDW 13.6 02/26/2016 1522   LYMPHSABS 1.7 07/11/2018 1404   LYMPHSABS 1.7 02/26/2016 1522   EOSABS 0.1 07/11/2018 1404   EOSABS 0.2 02/26/2016 1522   BASOSABS 0.1 07/11/2018 1404   BASOSABS 0.0 02/26/2016 1522   Iron/TIBC/Ferritin/ %Sat    Component Value Date/Time   IRON 65 02/26/2016 1521   TIBC 280 02/26/2016 1521   FERRITIN 90 02/26/2016 1521   IRONPCTSAT 23 02/26/2016 1521   Lipid Panel     Component Value Date/Time   CHOL 220 (H) 07/11/2018 1404   TRIG 316 (H) 07/11/2018 1404   HDL 39 (L) 07/11/2018 1404   CHOLHDL 5 09/27/2017 1549   VLDL 36.4 09/27/2017 1549   LDLCALC 118 (H) 07/11/2018 1404   Hepatic Function Panel     Component Value Date/Time   PROT 6.9 07/11/2018 1404   ALBUMIN 4.2 07/11/2018 1404   ALBUMIN 3.9 02/26/2016 1522   AST 22 07/11/2018 1404   AST 19 02/26/2016 1522   ALT 22 07/11/2018 1404   ALT 21 02/26/2016 1522   ALKPHOS 92 07/11/2018 1404   ALKPHOS 75 02/26/2016 1522   BILITOT <0.2 07/11/2018 1404      Component Value Date/Time   TSH 2.260 07/11/2018 1404   TSH 1.25 09/27/2017 1549   TSH 0.94 11/20/2015 1012    ECG  shows NSR with a rate of 76 BPM INDIRECT CALORIMETER done today shows a VO2 of 307 and a REE of 2136.  Her calculated basal metabolic rate is 4098 thus her basal metabolic rate is better than expected.    ASSESSMENT AND PLAN: Other fatigue - Plan: EKG 12-Lead, Vitamin B12, CBC With Differential, Hemoglobin A1c, Insulin, random, Folate, T3, T4, free, TSH, VITAMIN D 25  Hydroxy (Vit-D Deficiency, Fractures)  Shortness of breath on exertion -  Plan: Lipid Panel With LDL/HDL Ratio  Essential hypertension - Plan: Comprehensive metabolic panel  Other depression - with emotional eating  At risk for heart disease  Class 2 severe obesity with serious comorbidity and body mass index (BMI) of 38.0 to 38.9 in adult, unspecified obesity type ([place])  PLAN: Fatigue Sephira was informed that her fatigue may be related to obesity, depression or many other causes. Labs will be ordered, and in the meanwhile Cathline has agreed to work on diet, exercise and weight loss to help with fatigue. Proper sleep hygiene was discussed including the need for 7-8 hours of quality sleep each night. A sleep study was not ordered based on symptoms and Epworth score.  Dyspnea on exertion Amylah's shortness of breath appears to be obesity related and exercise induced. She has agreed to work on weight loss and gradually increase exercise to treat her exercise induced shortness of breath. If Jenika follows our instructions and loses weight without improvement of her shortness of breath, we will plan to refer to pulmonology. We will monitor this condition regularly. Yazlyn agrees to this plan.  Hypertension We discussed sodium restriction, working on healthy weight loss, and a regular exercise program as the means to achieve improved blood pressure control. Presley agreed with this plan and agreed to follow up as directed. We will recheck blood pressure in 2 weeks and will continue to monitor her blood pressure as well as her progress with the above lifestyle modifications. She will continue her medications as prescribed and will watch for signs of hypotension as she continues her lifestyle modifications. We will check labs today.  Cardiovascular risk counseling Kemaya was given extended (15 minutes) coronary artery disease prevention counseling today. She is 62 y.o. female and has risk factors for heart  disease including obesity and hypertension. We discussed intensive lifestyle modifications today with an emphasis on specific weight loss instructions and strategies. Pt was also informed of the importance of increasing exercise and decreasing saturated fats to help prevent heart disease.  Depression with Emotional Eating Behaviors We discussed behavior modification techniques today to help Nathali deal with her emotional eating and depression. We will refer to [name] our bariatric psychologist.  Depression Screen Sharanda had a strongly positive depression screening. Depression is commonly associated with obesity and often results in emotional eating behaviors. We will monitor this closely and work on CBT to help improve the non-hunger eating patterns. Referral to Psychology may be required if no improvement is seen as she continues in our clinic.  Obesity Audie is currently in the action stage of change and her goal is to continue with weight loss efforts. I recommend Tequilla begin the structured treatment plan as follows:  She has agreed to follow the Category 3 plan Dalyla has been instructed to eventually work up to a goal of 150 minutes of combined cardio and strengthening exercise per week for weight loss and overall health benefits. We discussed the following Behavioral Modification Strategies today: increasing lean protein intake, decreasing simple carbohydrates  and work on meal planning and easy cooking plans   She was informed of the importance of frequent follow up visits to maximize her success with intensive lifestyle modifications for her multiple health conditions. She was informed we would discuss her lab results at her next visit unless there is a critical issue that needs to be addressed sooner. Martasia agreed to keep her next visit at the agreed upon time to discuss these results.    OBESITY BEHAVIORAL INTERVENTION  VISIT  Today's visit was # 1   Starting weight: 220  lbs Starting date: 07/11/18 Today's weight : 220 lbs  Today's date: 07/11/2018 Total lbs lost to date: 0   ASK: We discussed the diagnosis of obesity with Neville Route today and Tashawn agreed to give [place] permission to discuss obesity behavioral modification therapy today.  ASSESS: Averey has the diagnosis of obesity and her BMI today is 38.98 Keali is in the action stage of change   ADVISE: Keyarah was educated on the multiple health risks of obesity as well as the benefit of weight loss to improve her health. She was advised of the need for long term treatment and the importance of lifestyle modifications to improve her current health and to decrease her risk of future health problems.  AGREE: Multiple dietary modification options and treatment options were discussed and  Teeghan agreed to follow the recommendations documented in the above note.  ARRANGE: Diesha was educated on the importance of frequent visits to treat obesity as outlined per CMS and USPSTF guidelines and agreed to schedule her next follow up appointment today.  I, Doreene Nest, am acting as transcriptionist for Dennard Nip, MD   I have reviewed the above documentation for accuracy and completeness, and I agree with the above. -Dennard Nip, MD
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
## 2018-07-23 ENCOUNTER — Encounter: Payer: Self-pay | Admitting: Obstetrics & Gynecology

## 2018-07-25 ENCOUNTER — Telehealth (INDEPENDENT_AMBULATORY_CARE_PROVIDER_SITE_OTHER): Payer: Self-pay | Admitting: Family Medicine

## 2018-07-25 NOTE — Telephone Encounter (Signed)
Pt has received butternut squash and wants to make sure it's approved for her plan. drh

## 2018-07-27 ENCOUNTER — Ambulatory Visit (INDEPENDENT_AMBULATORY_CARE_PROVIDER_SITE_OTHER): Payer: BLUE CROSS/BLUE SHIELD | Admitting: Family Medicine

## 2018-07-27 ENCOUNTER — Ambulatory Visit (INDEPENDENT_AMBULATORY_CARE_PROVIDER_SITE_OTHER): Payer: BLUE CROSS/BLUE SHIELD | Admitting: Psychology

## 2018-07-27 VITALS — BP 116/71 | HR 64 | Temp 97.8°F | Ht 63.0 in | Wt 210.0 lb

## 2018-07-27 DIAGNOSIS — F3289 Other specified depressive episodes: Secondary | ICD-10-CM

## 2018-07-27 DIAGNOSIS — Z9189 Other specified personal risk factors, not elsewhere classified: Secondary | ICD-10-CM

## 2018-07-27 DIAGNOSIS — E8881 Metabolic syndrome: Secondary | ICD-10-CM

## 2018-07-27 DIAGNOSIS — E782 Mixed hyperlipidemia: Secondary | ICD-10-CM | POA: Diagnosis not present

## 2018-07-27 DIAGNOSIS — E559 Vitamin D deficiency, unspecified: Secondary | ICD-10-CM

## 2018-07-27 DIAGNOSIS — Z6837 Body mass index (BMI) 37.0-37.9, adult: Secondary | ICD-10-CM

## 2018-07-27 MED ORDER — VITAMIN D (ERGOCALCIFEROL) 1.25 MG (50000 UNIT) PO CAPS: 50000.0000 [IU] | ORAL_CAPSULE | ORAL | 0 refills | Status: DC

## 2018-07-27 NOTE — Telephone Encounter (Signed)
Patient was seen on 07/27/18. Beth Rivers, [name]

## 2018-07-28 NOTE — Progress Notes (Signed)
Office: [phone]  /  Fax: [phone]   HPI:   Chief Complaint: OBESITY Beth Rivers is here to discuss her progress with her obesity treatment plan. She is on the Category 3 plan and is following her eating plan approximately 100 % of the time. She states she is walking 3 miles for 35 minutes 3 times per week. Beth Rivers did very well with weight loss on Category 3 plan. She states her hunger is controlled and she is doing well with meal prep.  Her weight is 210 lb (95.3 kg) today and has had a weight loss of 10 pounds over a period of 2 weeks since her last visit. She has lost 10 lbs since starting treatment with [place].  Hyperlipidemia (Mixed) Beth Rivers has hyperlipidemia and has elevated cholesterol levels. She is on fish oil OTC and denies any chest pain, claudication or myalgias. She is doing well on diet and has been trying to improve her cholesterol levels with intensive lifestyle modification including a low saturated fat diet, exercise and weight loss.  Vitamin D Deficiency Beth Rivers has a new diagnosis of vitamin D deficiency. She is not on Vit D, she notes fatigue and denies nausea, vomiting or muscle weakness.  Insulin Resistance Beth Rivers has a new diagnosis of insulin resistance based on her elevated fasting insulin level >5. Although Beth Rivers's blood glucose readings are still under good control, insulin resistance puts her at greater risk of metabolic syndrome and diabetes. She is not taking metformin currently and notes polyphagia, but she is doing well on diet prescription. She continues to work on diet and exercise to decrease risk of diabetes.  At risk for diabetes Beth Rivers is at higher than averagerisk for developing diabetes due to her obesity and insulin resistance. She currently denies polyuria or polydipsia.  ALLERGIES: No Known Allergies  MEDICATIONS: Current Outpatient Medications on File Prior to Visit  Medication Sig Dispense Refill  . triamterene-hydrochlorothiazide (MAXZIDE-25)  37.5-25 MG tablet Take 1 tablet by mouth daily. 30 tablet 6   No current facility-administered medications on file prior to visit.     PAST MEDICAL HISTORY: Past Medical History:  Diagnosis Date  . Anxiety   . Depression 09/2001  . Elevated BP   . Elevated LDL cholesterol level 1/14  . Feeling lonely   . History of uterine fibroid 11/2002  . HTN (hypertension)   . Joint pain   . Leg edema   . Obesity   . Obstructive sleep apnea     PAST SURGICAL HISTORY: No past surgical history on file.  SOCIAL HISTORY: Social History   Tobacco Use  . Smoking status: Never Smoker  . Smokeless tobacco: Never Used  Substance Use Topics  . Alcohol use: Yes    Alcohol/week: 1.0 standard drinks    Types: 1 Glasses of wine per week  . Drug use: No    FAMILY HISTORY: Family History  Problem Relation Age of Onset  . Hypertension Mother   . Hyperlipidemia Mother   . Depression Mother   . Obesity Mother   . Hypertension Father   . Heart attack Father   . Obesity Father   . Hyperlipidemia Father   . Heart disease Father   . Sudden death Daughter   . Colon cancer Maternal Grandmother     ROS: Review of Systems  Constitutional: Positive for malaise/fatigue and weight loss.  Cardiovascular: Negative for chest pain and claudication.  Gastrointestinal: Negative for nausea and vomiting.  Genitourinary: Negative for frequency.  Musculoskeletal: Negative for myalgias.  Negative muscle weakness  Endo/Heme/Allergies: Negative for polydipsia.    PHYSICAL EXAM: Blood pressure 116/71, pulse 64, temperature 97.8 F (36.6 C), temperature source Oral, height 5\' 3"  (1.6 m), weight 210 lb (95.3 kg), last menstrual period 09/22/2003, SpO2 97 %. Body mass index is 37.2 kg/m. Physical Exam  Constitutional: She is oriented to person, place, and time. She appears well-developed and well-nourished.  Cardiovascular: Normal rate.  Pulmonary/Chest: Effort normal.  Musculoskeletal: Normal range  of motion.  Neurological: She is oriented to person, place, and time.  Skin: Skin is warm and dry.  Psychiatric: She has a normal mood and affect. Her behavior is normal.  Vitals reviewed.   RECENT LABS AND TESTS: BMET    Component Value Date/Time   NA 143 07/11/2018 1404   K 4.2 07/11/2018 1404   K 3.9 02/26/2016 1522   CL 105 07/11/2018 1404   CL 103 02/26/2016 1522   CO2 24 07/11/2018 1404   CO2 26 02/26/2016 1522   GLUCOSE 90 07/11/2018 1404   GLUCOSE 99 09/27/2017 1549   BUN 18 07/11/2018 1404   CREATININE 0.52 (L) 07/11/2018 1404   CREATININE 0.64 02/26/2016 1522   CREATININE 0.84 01/28/2016 1035   CALCIUM 8.7 07/11/2018 1404   CALCIUM 8.8 02/26/2016 1522   GFRNONAA 103 07/11/2018 1404   GFRAA 119 07/11/2018 1404   Lab Results  Component Value Date   HGBA1C 5.2 07/11/2018   HGBA1C 5.3 09/27/2017   Lab Results  Component Value Date   INSULIN 21.0 07/11/2018   CBC    Component Value Date/Time   WBC 6.2 07/11/2018 1404   WBC 9.9 09/27/2017 1549   RBC 4.56 07/11/2018 1404   RBC 4.90 09/27/2017 1549   HGB 14.3 07/11/2018 1404   HGB 15.9 02/26/2016 1522   HCT 42.8 07/11/2018 1404   HCT 45.5 02/26/2016 1522   PLT 253.0 09/27/2017 1549   PLT 236 02/26/2016 1522   MCV 94 07/11/2018 1404   MCV 93 02/26/2016 1522   MCH 31.4 07/11/2018 1404   MCH 32.6 02/26/2016 1522   MCH 32.4 01/28/2016 1035   MCHC 33.4 07/11/2018 1404   MCHC 34.3 09/27/2017 1549   RDW 13.7 07/11/2018 1404   RDW 13.6 02/26/2016 1522   LYMPHSABS 1.7 07/11/2018 1404   LYMPHSABS 1.7 02/26/2016 1522   EOSABS 0.1 07/11/2018 1404   EOSABS 0.2 02/26/2016 1522   BASOSABS 0.1 07/11/2018 1404   BASOSABS 0.0 02/26/2016 1522   Iron/TIBC/Ferritin/ %Sat    Component Value Date/Time   IRON 65 02/26/2016 1521   TIBC 280 02/26/2016 1521   FERRITIN 90 02/26/2016 1521   IRONPCTSAT 23 02/26/2016 1521   Lipid Panel     Component Value Date/Time   CHOL 220 (H) 07/11/2018 1404   TRIG 316 (H)  07/11/2018 1404   HDL 39 (L) 07/11/2018 1404   CHOLHDL 5 09/27/2017 1549   VLDL 36.4 09/27/2017 1549   LDLCALC 118 (H) 07/11/2018 1404   Hepatic Function Panel     Component Value Date/Time   PROT 6.9 07/11/2018 1404   ALBUMIN 4.2 07/11/2018 1404   ALBUMIN 3.9 02/26/2016 1522   AST 22 07/11/2018 1404   AST 19 02/26/2016 1522   ALT 22 07/11/2018 1404   ALT 21 02/26/2016 1522   ALKPHOS 92 07/11/2018 1404   ALKPHOS 75 02/26/2016 1522   BILITOT <0.2 07/11/2018 1404      Component Value Date/Time   TSH 2.260 07/11/2018 1404   TSH 1.25 09/27/2017 1549  TSH 0.94 11/20/2015 1012  Results for OMMIE, DEGEORGE (MRN [id number]) as of 07/28/2018 09:20  Ref. Range 07/11/2018 14:04  Vitamin D, 25-Hydroxy Latest Ref Range: 30.0 - 100.0 ng/mL 16.1 (L)    ASSESSMENT AND PLAN: Mixed hyperlipidemia  Vitamin D deficiency - Plan: Vitamin D, Ergocalciferol, (DRISDOL) 1.25 MG (50000 UT) CAPS capsule  Insulin resistance  At risk for diabetes mellitus  Class 2 severe obesity with serious comorbidity and body mass index (BMI) of 37.0 to 37.9 in adult, unspecified obesity type ([place])  PLAN:  Hyperlipidemia (Mixed) Beth Rivers was informed of the American Heart Association Guidelines emphasizing intensive lifestyle modifications as the first line treatment for hyperlipidemia. We discussed many lifestyle modifications today in depth, and Beth Rivers will continue to work on decreasing saturated fats such as fatty red meat, butter and many fried foods. She will also increase vegetables and lean protein in her diet and continue to work on diet, exercise, and weight loss efforts. We will recheck labs in 3 months. Beth Rivers agrees to follow up with our clinic in 2 weeks.  Insulin Resistance Beth Rivers will continue to work on weight loss, diet, exercise, and decreasing simple carbohydrates in her diet to help decrease the risk of diabetes. We dicussed metformin including benefits and risks. She was informed that eating too many  simple carbohydrates or too many calories at one sitting increases the likelihood of GI side effects. Beth Rivers declined metformin for now and prescription was not written today. Beth Rivers agrees to follow up with our clinic in 2 weeks as directed to monitor her progress.  Diabetes risk counselling Beth Rivers was given extended (30 minutes) diabetes prevention counseling today. She is 62 y.o. female and has risk factors for diabetes including obesity and insulin resistance. We discussed intensive lifestyle modifications today with an emphasis on weight loss as well as increasing exercise and decreasing simple carbohydrates in her diet.  Vitamin D Deficiency Beth Rivers was informed that low vitamin D levels contributes to fatigue and are associated with obesity, breast, and colon cancer. Beth Rivers agrees to start prescription Vit D @50 ,000 IU every week #4 with no refills. She will follow up for routine testing of vitamin D, at least 2-3 times per year. She was informed of the risk of over-replacement of vitamin D and agrees to not increase her dose unless she discusses this with [place] first. Moreen agrees to follow up with our clinic in 2 weeks.  Obesity Beth Rivers is currently in the action stage of change. As such, her goal is to continue with weight loss efforts She has agreed to follow the Category 3 plan Beth Rivers has been instructed to work up to a goal of 150 minutes of combined cardio and strengthening exercise per week for weight loss and overall health benefits. We discussed the following Behavioral Modification Strategies today: increasing lean protein intake, decreasing simple carbohydrates  and work on meal planning and easy cooking plans   Beth Rivers has agreed to follow up with our clinic in 2 weeks. She was informed of the importance of frequent follow up visits to maximize her success with intensive lifestyle modifications for her multiple health conditions.   OBESITY BEHAVIORAL INTERVENTION VISIT  Today's visit was #  2   Starting weight: 220 lbs Starting date: 07/11/18 Today's weight : 210 lbs  Today's date: 07/27/2018 Total lbs lost to date: 10    ASK: We discussed the diagnosis of obesity with Beth Rivers today and Beth Rivers agreed to give [place] permission to discuss obesity behavioral modification therapy today.  ASSESS: Beth Rivers has the diagnosis of obesity and her BMI today is 37.21 Beth Rivers is in the action stage of change   ADVISE: Beth Rivers was educated on the multiple health risks of obesity as well as the benefit of weight loss to improve her health. She was advised of the need for long term treatment and the importance of lifestyle modifications to improve her current health and to decrease her risk of future health problems.  AGREE: Multiple dietary modification options and treatment options were discussed and  Beth Rivers agreed to follow the recommendations documented in the above note.  ARRANGE: Beth Rivers was educated on the importance of frequent visits to treat obesity as outlined per CMS and USPSTF guidelines and agreed to schedule her next follow up appointment today.  I, Trixie Dredge, am acting as transcriptionist for Dennard Nip, MD  I have reviewed the above documentation for accuracy and completeness, and I agree with the above. -Dennard Nip, MD

## 2018-08-09 NOTE — Progress Notes (Unsigned)
Office: [phone]  /  Fax: [phone]   Date: August 11, 2018  Time Seen:*** Duration:*** Provider: Glennie Isle, Psy.D. Type of Session: Individual Therapy   HPI: Beth Rivers was referred by Beth Rivers due to depression with emotional eating behaviors. She was seen for an initial appointment by this provider on July 27, 2018. Per the note for the initial visit with Beth Rivers on July 11, 2018, "Beth Rivers not on medications and she has a PHQ-9 score of 16. She has been in a very toxic marriage and with the death of her daughter approximately seven years ago; she feels lonely with low self esteem. Beth Rivers recognizes she uses food for comfort and she is likely frustrated with herself. Beth Rivers struggleswith emotional eating and using food for comfort to the extent that it is negatively impacting herhealth. Sheoften snacks when sheis not hungry. Beth Rivers. Sheis attempting to workon behavior modification techniques to help reduce heremotional eating.Sheshows no sign of suicidal or homicidal ideations." In addition, per the note for the initial visit with Beth Rivers on July 11, 2018, Beth Rivers started gaining weight in the last six years. Her heaviest weight ever was 213pounds. During the initial appointment with Beth Rivers, Beth Rivers reported experiencing the following: significant food cravings issues; frequently drinking liquids with calories; problems with excessive hunger; frequently eating larger portions than normal; binge eating behaviors; and struggling with emotional eating.  During the initial appointment with this provider, Beth Rivers reported, "I did not know this was optional." Nevertheless, she expressed willingness to continue with the initial appointment with this provider. She indicated the last episode of emotional eating was before starting with this clinic. Beth Rivers clarified  that episode was also characterized by overeating. She acknowledged she craves carbohydrates, which include sweets and bread. As it relates to her prescribed meal plan, Beth Rivers stated, "I am all in." Moreover, Beth Rivers reported she has experienced emotional eating her "whole life." She explained that she was often rewarded with food during her childhood. Beth Rivers further shared that she has struggled with weight loss for years. In addition, she denied a history of binge eating and has never been diagnosed with an eating disorder. Beth Rivers also denied a history of purging and engagement in other compensatory strategies. Furthermore, Beth Rivers was asked to complete a questionnaire assessing various behaviors related to emotional eating. Beth Rivers endorsed the following: overeat when you are celebrating, experience food cravings on a regular basis, eat certain foods when you are anxious, stressed, depressed, or your feelings are hurt, use food to help you cope with emotional situations, find food is comforting to you, overeat when you are angry or upset, overeat when you are worried about something, overeat frequently when you are bored or lonely, not worry about what you eat when you are in a good mood, overeat when you are alone, but eat much less when you are with other people and eat as a reward.  Session Content: Session focused on the following treatment goal: decrease emotional eating. The session was initiated with the administration of the PHQ-9 and GAD-7, as well as a brief check-in.   Beth Rivers was receptive to today's session as evidenced by ***.  Mental Status Examination: Beth Rivers arrived on time for the appointment. She presented as appropriately dressed and groomed. Beth Rivers appeared her stated age and demonstrated adequate orientation to time, place, person, and purpose of the appointment. She also demonstrated appropriate eye contact. No psychomotor abnormalities  or behavioral peculiarities noted. Her mood was  {gbmood:21757} with congruent affect. Her thought processes were logical, linear, and goal-directed. No hallucinations, delusions, bizarre thinking or behavior reported or observed. Judgment, insight, and impulse control appeared to be grossly intact. There was no evidence of paraphasias (i.e., errors in speech, gross mispronunciations, and word substitutions), repetition deficits, or disturbances in volume or prosody (i.e., rhythm and intonation). There was no evidence of attention or memory impairments. Beth Rivers denied current suicidal and homicidal ideation, intent or plan.  Structured Assessment Results: The Patient Health Questionnaire-9 (PHQ-9) is a self-report measure that assesses symptoms and severity of depression over the course of the last two weeks. Beth Rivers obtained a score of *** suggesting {GBPHQ9SEVERITY:21752}. Beth Rivers finds the endorsed symptoms to be {gbphq9difficulty:21754}.  The Generalized Anxiety Disorder-7 (GAD-7) is a brief self-report measure that assesses symptoms of anxiety over the course of the last two weeks. Beth Rivers obtained a score of *** suggesting {gbgad7severity:21753}.  Interventions: Beth Rivers was administered the PHQ-9 and GAD-7 for symptom monitoring. Content from the last session was reviewed. Throughout today's session, empathic reflections and validation were provided. Psychoeducation regarding *** was provided and *** [insert other interventions].   DSM-5 Diagnosis: 311 (F32.8) Other Specified Depressive Disorder, Emotional Eating  Treatment Goal & Progress: Beth Rivers was seen for an initial appointment with this provider on July 27, 2018 during which the following treatment goal was established: decrease emotional eating. Beth Rivers has demonstrated progress in her goal of decreasing emotional eating as evidenced by ***  Plan: Beth Rivers continues to appear able and willing to participate as evidenced by engagement in reciprocal conversation, and asking questions for clarification as  appropriate.*** The next appointment will be scheduled in {gbweeks:21758}. The next session will focus on reviewing learned skills, and working towards the established treatment goal.***

## 2018-08-10 ENCOUNTER — Ambulatory Visit (INDEPENDENT_AMBULATORY_CARE_PROVIDER_SITE_OTHER): Payer: Self-pay | Admitting: Psychology

## 2018-08-11 ENCOUNTER — Ambulatory Visit (INDEPENDENT_AMBULATORY_CARE_PROVIDER_SITE_OTHER): Payer: Self-pay | Admitting: Psychology

## 2018-08-11 ENCOUNTER — Ambulatory Visit (INDEPENDENT_AMBULATORY_CARE_PROVIDER_SITE_OTHER): Payer: BLUE CROSS/BLUE SHIELD | Admitting: Family Medicine

## 2018-08-11 ENCOUNTER — Encounter (INDEPENDENT_AMBULATORY_CARE_PROVIDER_SITE_OTHER): Payer: Self-pay | Admitting: Family Medicine

## 2018-08-11 VITALS — BP 114/72 | HR 78 | Temp 98.1°F | Ht 63.0 in | Wt 207.0 lb

## 2018-08-11 DIAGNOSIS — E8881 Metabolic syndrome: Secondary | ICD-10-CM | POA: Diagnosis not present

## 2018-08-11 DIAGNOSIS — E559 Vitamin D deficiency, unspecified: Secondary | ICD-10-CM

## 2018-08-11 DIAGNOSIS — F3289 Other specified depressive episodes: Secondary | ICD-10-CM | POA: Diagnosis not present

## 2018-08-11 DIAGNOSIS — Z6836 Body mass index (BMI) 36.0-36.9, adult: Secondary | ICD-10-CM

## 2018-08-11 MED ORDER — VITAMIN D (ERGOCALCIFEROL) 1.25 MG (50000 UNIT) PO CAPS: 50000.0000 [IU] | ORAL_CAPSULE | ORAL | 0 refills | Status: DC

## 2018-08-16 NOTE — Progress Notes (Signed)
Office: [phone]  /  Fax: [phone]   HPI:   Chief Complaint: OBESITY Beth Rivers is here to discuss her progress with her obesity treatment plan. She is on the Category 3 plan and is following her eating plan approximately 100 % of the time. She states she is walking 10,000 steps 7 times per week. Beth Rivers is doing very well. She is very motivated and happy with Category 3. She is eating all of her food.  Her weight is 207 lb (93.9 kg) today and has had a weight loss of 3 pounds over a period of 2 weeks since her last visit. She has lost 13 lbs since starting treatment with [place].  Vitamin D deficiency Beth Rivers has a diagnosis of vitamin D deficiency. She is currently taking vit D and is stable, but not at goal. She denies nausea, vomiting, or muscle weakness.  Insulin Resistance Beth Rivers has a diagnosis of insulin resistance based on her elevated fasting insulin level >5. Although Beth Rivers blood glucose readings are still under good control, insulin resistance puts her at greater risk of metabolic syndrome and diabetes. She is not taking metformin currently and continues to work on diet and exercise to decrease risk of diabetes. She denies polyphagia or cravings.  ALLERGIES: No Known Allergies  MEDICATIONS: Current Outpatient Medications on File Prior to Visit  Medication Sig Dispense Refill  . triamterene-hydrochlorothiazide (MAXZIDE-25) 37.5-25 MG tablet Take 1 tablet by mouth daily. 30 tablet 6   No current facility-administered medications on file prior to visit.     PAST MEDICAL HISTORY: Past Medical History:  Diagnosis Date  . Anxiety   . Depression 09/2001  . Elevated BP   . Elevated LDL cholesterol level 1/14  . Feeling lonely   . History of uterine fibroid 11/2002  . HTN (hypertension)   . Joint pain   . Leg edema   . Obesity   . Obstructive sleep apnea     PAST SURGICAL HISTORY: No past surgical history on file.  SOCIAL HISTORY: Social History   Tobacco Use  .  Smoking status: Never Smoker  . Smokeless tobacco: Never Used  Substance Use Topics  . Alcohol use: Yes    Alcohol/week: 1.0 standard drinks    Types: 1 Glasses of wine per week  . Drug use: No    FAMILY HISTORY: Family History  Problem Relation Age of Onset  . Hypertension Mother   . Hyperlipidemia Mother   . Depression Mother   . Obesity Mother   . Hypertension Father   . Heart attack Father   . Obesity Father   . Hyperlipidemia Father   . Heart disease Father   . Sudden death Daughter   . Colon cancer Maternal Grandmother     ROS: Review of Systems  Constitutional: Positive for weight loss.  Gastrointestinal: Negative for nausea and vomiting.  Musculoskeletal:       Negative for muscle weakness.  Endo/Heme/Allergies:       Negative for polyphagia.    PHYSICAL EXAM: Blood pressure 114/72, pulse 78, temperature 98.1 F (36.7 C), temperature source Oral, height 5\' 3"  (1.6 m), weight 207 lb (93.9 kg), last menstrual period 09/22/2003, SpO2 98 %. Body mass index is 36.67 kg/m. Physical Exam  Constitutional: She is oriented to person, place, and time. She appears well-developed and well-nourished.  Cardiovascular: Normal rate.  Pulmonary/Chest: Effort normal.  Musculoskeletal: Normal range of motion.  Neurological: She is oriented to person, place, and time.  Skin: Skin is warm and dry.  Psychiatric: She has a normal mood and affect. Her behavior is normal.  Vitals reviewed.   RECENT LABS AND TESTS: BMET    Component Value Date/Time   NA 143 07/11/2018 1404   K 4.2 07/11/2018 1404   K 3.9 02/26/2016 1522   CL 105 07/11/2018 1404   CL 103 02/26/2016 1522   CO2 24 07/11/2018 1404   CO2 26 02/26/2016 1522   GLUCOSE 90 07/11/2018 1404   GLUCOSE 99 09/27/2017 1549   BUN 18 07/11/2018 1404   CREATININE 0.52 (L) 07/11/2018 1404   CREATININE 0.64 02/26/2016 1522   CREATININE 0.84 01/28/2016 1035   CALCIUM 8.7 07/11/2018 1404   CALCIUM 8.8 02/26/2016 1522    GFRNONAA 103 07/11/2018 1404   GFRAA 119 07/11/2018 1404   Lab Results  Component Value Date   HGBA1C 5.2 07/11/2018   HGBA1C 5.3 09/27/2017   Lab Results  Component Value Date   INSULIN 21.0 07/11/2018   CBC    Component Value Date/Time   WBC 6.2 07/11/2018 1404   WBC 9.9 09/27/2017 1549   RBC 4.56 07/11/2018 1404   RBC 4.90 09/27/2017 1549   HGB 14.3 07/11/2018 1404   HGB 15.9 02/26/2016 1522   HCT 42.8 07/11/2018 1404   HCT 45.5 02/26/2016 1522   PLT 253.0 09/27/2017 1549   PLT 236 02/26/2016 1522   MCV 94 07/11/2018 1404   MCV 93 02/26/2016 1522   MCH 31.4 07/11/2018 1404   MCH 32.6 02/26/2016 1522   MCH 32.4 01/28/2016 1035   MCHC 33.4 07/11/2018 1404   MCHC 34.3 09/27/2017 1549   RDW 13.7 07/11/2018 1404   RDW 13.6 02/26/2016 1522   LYMPHSABS 1.7 07/11/2018 1404   LYMPHSABS 1.7 02/26/2016 1522   EOSABS 0.1 07/11/2018 1404   EOSABS 0.2 02/26/2016 1522   BASOSABS 0.1 07/11/2018 1404   BASOSABS 0.0 02/26/2016 1522   Iron/TIBC/Ferritin/ %Sat    Component Value Date/Time   IRON 65 02/26/2016 1521   TIBC 280 02/26/2016 1521   FERRITIN 90 02/26/2016 1521   IRONPCTSAT 23 02/26/2016 1521   Lipid Panel     Component Value Date/Time   CHOL 220 (H) 07/11/2018 1404   TRIG 316 (H) 07/11/2018 1404   HDL 39 (L) 07/11/2018 1404   CHOLHDL 5 09/27/2017 1549   VLDL 36.4 09/27/2017 1549   LDLCALC 118 (H) 07/11/2018 1404   Hepatic Function Panel     Component Value Date/Time   PROT 6.9 07/11/2018 1404   ALBUMIN 4.2 07/11/2018 1404   ALBUMIN 3.9 02/26/2016 1522   AST 22 07/11/2018 1404   AST 19 02/26/2016 1522   ALT 22 07/11/2018 1404   ALT 21 02/26/2016 1522   ALKPHOS 92 07/11/2018 1404   ALKPHOS 75 02/26/2016 1522   BILITOT <0.2 07/11/2018 1404      Component Value Date/Time   TSH 2.260 07/11/2018 1404   TSH 1.25 09/27/2017 1549   TSH 0.94 11/20/2015 1012   Results for EISLEY, BARBER (MRN [id number]) as of 08/16/2018 17:57  Ref. Range 07/11/2018 14:04    Vitamin D, 25-Hydroxy Latest Ref Range: 30.0 - 100.0 ng/mL 16.1 (L)   ASSESSMENT AND PLAN: Vitamin D deficiency - Plan: Vitamin D, Ergocalciferol, (DRISDOL) 1.25 MG (50000 UT) CAPS capsule  Insulin resistance  Other depression - with emotional eating  Class 2 severe obesity with serious comorbidity and body mass index (BMI) of 36.0 to 36.9 in adult, unspecified obesity type (HCC)  PLAN:  Vitamin D Deficiency Beth Rivers was informed that  low vitamin D levels contributes to fatigue and are associated with obesity, breast, and colon cancer. She agrees to continue to take prescription Vit D @50 ,000 IU every week #4 with no refills and will follow up for routine testing of vitamin D, at least 2-3 times per year. She was informed of the risk of over-replacement of vitamin D and agrees to not increase her dose unless she discusses this with [place] first. Beth Rivers agrees to follow up in 2 weeks.  Insulin Resistance Beth Rivers will continue to work on weight loss, exercise, and decreasing simple carbohydrates in her diet to help decrease the risk of diabetes. She was informed that eating too many simple carbohydrates or too many calories at one sitting increases the likelihood of GI side effects. Beth Rivers agreed to continue her diet and to follow up with [place] as directed to monitor her progress.  I spent > than 50% of the 15 minute visit on counseling as documented in the note.  Obesity Beth Rivers is currently in the action stage of change. As such, her goal is to continue with weight loss efforts. She has agreed to follow the Category 3 plan. Beth Rivers has been instructed to walk 10,000 steps per day for 7 days a week.  We discussed the following Behavioral Modification Strategies today: holiday eating strategies and planning for success.  Beth Rivers has agreed to follow up with our clinic in 2 weeks. She was informed of the importance of frequent follow up visits to maximize her success with intensive lifestyle modifications for  her multiple health conditions.   OBESITY BEHAVIORAL INTERVENTION VISIT  Today's visit was # 3   Starting weight: 220 lbs Starting date: 07/11/18 Today's weight : Weight: 207 lb (93.9 kg)  Today's date: 08/11/2018 Total lbs lost to date: 13  ASK: We discussed the diagnosis of obesity with Beth Rivers today and Beth Rivers agreed to give [place] permission to discuss obesity behavioral modification therapy today.  ASSESS: Beth Rivers has the diagnosis of obesity and her BMI today is 36.68. Shamela is in the action stage of change.   ADVISE: Beth Rivers was educated on the multiple health risks of obesity as well as the benefit of weight loss to improve her health. She was advised of the need for long term treatment and the importance of lifestyle modifications to improve her current health and to decrease her risk of future health problems.  AGREE: Multiple dietary modification options and treatment options were discussed and Beth Rivers agreed to follow the recommendations documented in the above note.  ARRANGE: Beth Rivers was educated on the importance of frequent visits to treat obesity as outlined per CMS and USPSTF guidelines and agreed to schedule her next follow up appointment today.  Lenward Chancellor, am acting as Location manager for Georgianne Fick, FNP.  I have reviewed the above documentation for accuracy and completeness, and I agree with the above.  - Dawn Whitmire, FNP-C.

## 2018-08-17 ENCOUNTER — Encounter (INDEPENDENT_AMBULATORY_CARE_PROVIDER_SITE_OTHER): Payer: Self-pay | Admitting: Family Medicine

## 2018-08-24 DIAGNOSIS — G4733 Obstructive sleep apnea (adult) (pediatric): Secondary | ICD-10-CM | POA: Diagnosis not present

## 2018-08-30 ENCOUNTER — Encounter (INDEPENDENT_AMBULATORY_CARE_PROVIDER_SITE_OTHER): Payer: Self-pay | Admitting: Family Medicine

## 2018-08-30 ENCOUNTER — Ambulatory Visit (INDEPENDENT_AMBULATORY_CARE_PROVIDER_SITE_OTHER): Payer: BLUE CROSS/BLUE SHIELD | Admitting: Family Medicine

## 2018-08-30 VITALS — BP 107/68 | HR 66 | Temp 98.1°F | Ht 63.0 in | Wt 203.0 lb

## 2018-08-30 DIAGNOSIS — Z6836 Body mass index (BMI) 36.0-36.9, adult: Secondary | ICD-10-CM

## 2018-08-30 DIAGNOSIS — E8881 Metabolic syndrome: Secondary | ICD-10-CM

## 2018-08-30 NOTE — Progress Notes (Signed)
Office: [phone]  /  Fax: [phone]   HPI:   Chief Complaint: OBESITY Beth Rivers is here to discuss her progress with her obesity treatment plan. She is on the Category 3 plan and is following her eating plan approximately 95 % of the time. She states she is walking 10,000 steps 7 days a week. Beth Rivers continues to do very well with weight loss on her Category 3 plan. She is working hard to eat all of her food and her hunger is mostly controlled.  Her weight is 203 lb (92.1 kg) today and has had a weight loss of 4 pounds over a period of 2 to 3 weeks since her last visit. She has lost 17 lbs since starting treatment with [place].  Insulin Resistance Beth Rivers has a diagnosis of insulin resistance based on her elevated fasting insulin level >5. Although Beth Rivers's blood glucose readings are still under good control, insulin resistance puts her at greater risk of metabolic syndrome and diabetes. She is stable on diet and doing well with decreasing simple carbohydrates and increasing activity. She notes polyphagia has improved and denies hypoglycemia.   ALLERGIES: No Known Allergies  MEDICATIONS: Current Outpatient Medications on File Prior to Visit  Medication Sig Dispense Refill  . triamterene-hydrochlorothiazide (MAXZIDE-25) 37.5-25 MG tablet Take 1 tablet by mouth daily. 30 tablet 6  . Vitamin D, Ergocalciferol, (DRISDOL) 1.25 MG (50000 UT) CAPS capsule Take 1 capsule (50,000 Units total) by mouth every 7 (seven) days. 4 capsule 0   No current facility-administered medications on file prior to visit.     PAST MEDICAL HISTORY: Past Medical History:  Diagnosis Date  . Anxiety   . Depression 09/2001  . Elevated BP   . Elevated LDL cholesterol level 1/14  . Feeling lonely   . History of uterine fibroid 11/2002  . HTN (hypertension)   . Joint pain   . Leg edema   . Obesity   . Obstructive sleep apnea     PAST SURGICAL HISTORY: History reviewed. No pertinent surgical history.  SOCIAL  HISTORY: Social History   Tobacco Use  . Smoking status: Never Smoker  . Smokeless tobacco: Never Used  Substance Use Topics  . Alcohol use: Yes    Alcohol/week: 1.0 standard drinks    Types: 1 Glasses of wine per week  . Drug use: No    FAMILY HISTORY: Family History  Problem Relation Age of Onset  . Hypertension Mother   . Hyperlipidemia Mother   . Depression Mother   . Obesity Mother   . Hypertension Father   . Heart attack Father   . Obesity Father   . Hyperlipidemia Father   . Heart disease Father   . Sudden death Daughter   . Colon cancer Maternal Grandmother     ROS: Review of Systems  Constitutional: Positive for weight loss.  Endo/Heme/Allergies:       Positive polyphagia Negative hypoglycemia    PHYSICAL EXAM: Blood pressure 107/68, pulse 66, temperature 98.1 F (36.7 C), temperature source Oral, height 5\' 3"  (1.6 m), weight 203 lb (92.1 kg), last menstrual period 09/22/2003, SpO2 98 %. Body mass index is 35.96 kg/m. Physical Exam  Constitutional: She is oriented to person, place, and time. She appears well-developed and well-nourished.  Cardiovascular: Normal rate.  Pulmonary/Chest: Effort normal.  Musculoskeletal: Normal range of motion.  Neurological: She is oriented to person, place, and time.  Skin: Skin is warm and dry.  Psychiatric: She has a normal mood and affect. Her behavior is  normal.  Vitals reviewed.   RECENT LABS AND TESTS: BMET    Component Value Date/Time   NA 143 07/11/2018 1404   K 4.2 07/11/2018 1404   K 3.9 02/26/2016 1522   CL 105 07/11/2018 1404   CL 103 02/26/2016 1522   CO2 24 07/11/2018 1404   CO2 26 02/26/2016 1522   GLUCOSE 90 07/11/2018 1404   GLUCOSE 99 09/27/2017 1549   BUN 18 07/11/2018 1404   CREATININE 0.52 (L) 07/11/2018 1404   CREATININE 0.64 02/26/2016 1522   CREATININE 0.84 01/28/2016 1035   CALCIUM 8.7 07/11/2018 1404   CALCIUM 8.8 02/26/2016 1522   GFRNONAA 103 07/11/2018 1404   GFRAA 119  07/11/2018 1404   Lab Results  Component Value Date   HGBA1C 5.2 07/11/2018   HGBA1C 5.3 09/27/2017   Lab Results  Component Value Date   INSULIN 21.0 07/11/2018   CBC    Component Value Date/Time   WBC 6.2 07/11/2018 1404   WBC 9.9 09/27/2017 1549   RBC 4.56 07/11/2018 1404   RBC 4.90 09/27/2017 1549   HGB 14.3 07/11/2018 1404   HGB 15.9 02/26/2016 1522   HCT 42.8 07/11/2018 1404   HCT 45.5 02/26/2016 1522   PLT 253.0 09/27/2017 1549   PLT 236 02/26/2016 1522   MCV 94 07/11/2018 1404   MCV 93 02/26/2016 1522   MCH 31.4 07/11/2018 1404   MCH 32.6 02/26/2016 1522   MCH 32.4 01/28/2016 1035   MCHC 33.4 07/11/2018 1404   MCHC 34.3 09/27/2017 1549   RDW 13.7 07/11/2018 1404   RDW 13.6 02/26/2016 1522   LYMPHSABS 1.7 07/11/2018 1404   LYMPHSABS 1.7 02/26/2016 1522   EOSABS 0.1 07/11/2018 1404   EOSABS 0.2 02/26/2016 1522   BASOSABS 0.1 07/11/2018 1404   BASOSABS 0.0 02/26/2016 1522   Iron/TIBC/Ferritin/ %Sat    Component Value Date/Time   IRON 65 02/26/2016 1521   TIBC 280 02/26/2016 1521   FERRITIN 90 02/26/2016 1521   IRONPCTSAT 23 02/26/2016 1521   Lipid Panel     Component Value Date/Time   CHOL 220 (H) 07/11/2018 1404   TRIG 316 (H) 07/11/2018 1404   HDL 39 (L) 07/11/2018 1404   CHOLHDL 5 09/27/2017 1549   VLDL 36.4 09/27/2017 1549   LDLCALC 118 (H) 07/11/2018 1404   Hepatic Function Panel     Component Value Date/Time   PROT 6.9 07/11/2018 1404   ALBUMIN 4.2 07/11/2018 1404   ALBUMIN 3.9 02/26/2016 1522   AST 22 07/11/2018 1404   AST 19 02/26/2016 1522   ALT 22 07/11/2018 1404   ALT 21 02/26/2016 1522   ALKPHOS 92 07/11/2018 1404   ALKPHOS 75 02/26/2016 1522   BILITOT <0.2 07/11/2018 1404      Component Value Date/Time   TSH 2.260 07/11/2018 1404   TSH 1.25 09/27/2017 1549   TSH 0.94 11/20/2015 1012    ASSESSMENT AND PLAN: Insulin resistance  Class 2 severe obesity with serious comorbidity and body mass index (BMI) of 36.0 to 36.9 in  adult, unspecified obesity type ([place])  PLAN:  Insulin Resistance Tasia will continue to work on weight loss, diet, exercise, and decreasing simple carbohydrates in her diet to help decrease the risk of diabetes. We dicussed metformin including benefits and risks. She was informed that eating too many simple carbohydrates or too many calories at one sitting increases the likelihood of GI side effects. Lyssa declined metformin for now and prescription was not written today. Shamela agrees to follow  up with our clinic in 2 weeks as directed to monitor her progress.  I spent > than 50% of the 15 minute visit on counseling as documented in the note.  Obesity Brynlie is currently in the action stage of change. As such, her goal is to continue with weight loss efforts She has agreed to follow the Category 3 plan Finesse has been instructed to work up to a goal of 150 minutes of combined cardio and strengthening exercise per week for weight loss and overall health benefits. We discussed the following Behavioral Modification Strategies today: increasing lean protein intake, decreasing simple carbohydrates , work on meal planning and easy cooking plans, holiday eating strategies, and celebration eating strategies   Jenness has agreed to follow up with our clinic in 2 weeks. She was informed of the importance of frequent follow up visits to maximize her success with intensive lifestyle modifications for her multiple health conditions.   OBESITY BEHAVIORAL INTERVENTION VISIT  Today's visit was # 4   Starting weight: 220 lbs Starting date: 07/11/18 Today's weight : 203 lbs  Today's date: 08/30/2018 Total lbs lost to date: 17    ASK: We discussed the diagnosis of obesity with Neville Route today and Shermaine agreed to give [place] permission to discuss obesity behavioral modification therapy today.  ASSESS: Julizza has the diagnosis of obesity and her BMI today is 35.97 Kenitha is in the action stage of change    ADVISE: Petrea was educated on the multiple health risks of obesity as well as the benefit of weight loss to improve her health. She was advised of the need for long term treatment and the importance of lifestyle modifications to improve her current health and to decrease her risk of future health problems.  AGREE: Multiple dietary modification options and treatment options were discussed and  Ailyn agreed to follow the recommendations documented in the above note.  ARRANGE: Jahnaya was educated on the importance of frequent visits to treat obesity as outlined per CMS and USPSTF guidelines and agreed to schedule her next follow up appointment today.  I, Trixie Dredge, am acting as transcriptionist for Dennard Nip, MD  I have reviewed the above documentation for accuracy and completeness, and I agree with the above. -Dennard Nip, MD

## 2018-09-12 ENCOUNTER — Ambulatory Visit (INDEPENDENT_AMBULATORY_CARE_PROVIDER_SITE_OTHER): Payer: BLUE CROSS/BLUE SHIELD | Admitting: Family Medicine

## 2018-09-12 ENCOUNTER — Encounter (INDEPENDENT_AMBULATORY_CARE_PROVIDER_SITE_OTHER): Payer: Self-pay | Admitting: Family Medicine

## 2018-09-12 VITALS — BP 124/75 | HR 67 | Temp 98.0°F | Ht 63.0 in | Wt 201.0 lb

## 2018-09-12 DIAGNOSIS — Z9189 Other specified personal risk factors, not elsewhere classified: Secondary | ICD-10-CM

## 2018-09-12 DIAGNOSIS — Z6835 Body mass index (BMI) 35.0-35.9, adult: Secondary | ICD-10-CM | POA: Diagnosis not present

## 2018-09-12 DIAGNOSIS — E559 Vitamin D deficiency, unspecified: Secondary | ICD-10-CM | POA: Diagnosis not present

## 2018-09-12 MED ORDER — VITAMIN D (ERGOCALCIFEROL) 1.25 MG (50000 UNIT) PO CAPS: 50000.0000 [IU] | ORAL_CAPSULE | ORAL | 0 refills | Status: DC

## 2018-09-12 NOTE — Progress Notes (Signed)
Office: [phone]  /  Fax: [phone]   HPI:   Chief Complaint: OBESITY Beth Rivers is here to discuss her progress with her obesity treatment plan. She is on the Category 3 plan and is following her eating plan approximately 90 % of the time. She states she is walking 10,000 steps a day. Beth Rivers is doing well with weight loss even with increased holiday temptations. Her hunger is controlled and she is doing well with portions control and smart food choices.  Her weight is 201 lb (91.2 kg) today and has had a weight loss of 2 pounds over a period of 2 weeks since her last visit. She has lost 19 lbs since starting treatment with [place].  Vitamin D Deficiency Beth Rivers has a diagnosis of vitamin D deficiency. She is stable on prescription Vit D, but level is not yet at goal. She denies nausea, vomiting or muscle weakness.  At risk for osteopenia and osteoporosis Beth Rivers is at higher risk of osteopenia and osteoporosis due to vitamin D deficiency.   ASSESSMENT AND PLAN:  Vitamin D deficiency - Plan: Vitamin D, Ergocalciferol, (DRISDOL) 1.25 MG (50000 UT) CAPS capsule  At risk for osteoporosis  Class 2 severe obesity with serious comorbidity and body mass index (BMI) of 35.0 to 35.9 in adult, unspecified obesity type (Beth Rivers)  PLAN:  Vitamin D Deficiency Beth Rivers was informed that low vitamin D levels contributes to fatigue and are associated with obesity, breast, and colon cancer. Beth Rivers agrees to continue taking prescription Vit D @50 ,000 IU every week #4 and we will refill for 1 month. She will follow up for routine testing of vitamin D, at least 2-3 times per year. She was informed of the risk of over-replacement of vitamin D and agrees to not increase her dose unless she discusses this with [place] first. Beth Rivers agrees to follow up with our clinic in 3 to 4 weeks.  At risk for osteopenia and osteoporosis Beth Rivers was given extended (15 minutes) osteoporosis prevention counseling today. Beth Rivers is at risk for  osteopenia and osteoporsis due to her vitamin D deficiency. She was encouraged to take her vitamin D and follow her higher calcium diet and increase strengthening exercise to help strengthen her bones and decrease her risk of osteopenia and osteoporosis.  Obesity Beth Rivers is currently in the action stage of change. As such, her goal is to maintain weight for now She has agreed to follow the Category 3 plan Beth Rivers has been instructed to work up to a goal of 150 minutes of combined cardio and strengthening exercise per week for weight loss and overall health benefits. We discussed the following Behavioral Modification Strategies today: increasing lean protein intake, decreasing simple carbohydrates  and work on meal planning and easy cooking plans Beth Rivers's goal is to maintain over Christmas the get back on track.  Beth Rivers has agreed to follow up with our clinic in 3 to 4 weeks. She was informed of the importance of frequent follow up visits to maximize her success with intensive lifestyle modifications for her multiple health conditions.  ALLERGIES: No Known Allergies  MEDICATIONS: Current Outpatient Medications on File Prior to Visit  Medication Sig Dispense Refill  . triamterene-hydrochlorothiazide (MAXZIDE-25) 37.5-25 MG tablet Take 1 tablet by mouth daily. 30 tablet 6   No current facility-administered medications on file prior to visit.     PAST MEDICAL HISTORY: Past Medical History:  Diagnosis Date  . Anxiety   . Depression 09/2001  . Elevated BP   . Elevated LDL cholesterol  level 1/14  . Feeling lonely   . History of uterine fibroid 11/2002  . HTN (hypertension)   . Joint pain   . Leg edema   . Obesity   . Obstructive sleep apnea     PAST SURGICAL HISTORY: History reviewed. No pertinent surgical history.  SOCIAL HISTORY: Social History   Tobacco Use  . Smoking status: Never Smoker  . Smokeless tobacco: Never Used  Substance Use Topics  . Alcohol use: Yes    Alcohol/week:  1.0 standard drinks    Types: 1 Glasses of wine per week  . Drug use: No    FAMILY HISTORY: Family History  Problem Relation Age of Onset  . Hypertension Mother   . Hyperlipidemia Mother   . Depression Mother   . Obesity Mother   . Hypertension Father   . Heart attack Father   . Obesity Father   . Hyperlipidemia Father   . Heart disease Father   . Sudden death Daughter   . Colon cancer Maternal Grandmother     ROS: Review of Systems  Constitutional: Positive for weight loss.  Gastrointestinal: Negative for nausea and vomiting.  Musculoskeletal:       Negative muscle weakness    PHYSICAL EXAM: Blood pressure 124/75, pulse 67, temperature 98 F (36.7 C), temperature source Oral, height 5\' 3"  (1.6 m), weight 201 lb (91.2 kg), last menstrual period 09/22/2003, SpO2 97 %. Body mass index is 35.61 kg/m. Physical Exam Vitals signs reviewed.  Constitutional:      Appearance: Normal appearance. She is obese.  Cardiovascular:     Rate and Rhythm: Normal rate.     Pulses: Normal pulses.  Pulmonary:     Effort: Pulmonary effort is normal.  Musculoskeletal: Normal range of motion.  Skin:    General: Skin is warm and dry.  Neurological:     Mental Status: She is alert and oriented to person, place, and time.  Psychiatric:        Mood and Affect: Mood normal.        Behavior: Behavior normal.     RECENT LABS AND TESTS: BMET    Component Value Date/Time   NA 143 07/11/2018 1404   K 4.2 07/11/2018 1404   K 3.9 02/26/2016 1522   CL 105 07/11/2018 1404   CL 103 02/26/2016 1522   CO2 24 07/11/2018 1404   CO2 26 02/26/2016 1522   GLUCOSE 90 07/11/2018 1404   GLUCOSE 99 09/27/2017 1549   BUN 18 07/11/2018 1404   CREATININE 0.52 (L) 07/11/2018 1404   CREATININE 0.64 02/26/2016 1522   CREATININE 0.84 01/28/2016 1035   CALCIUM 8.7 07/11/2018 1404   CALCIUM 8.8 02/26/2016 1522   GFRNONAA 103 07/11/2018 1404   GFRAA 119 07/11/2018 1404   Lab Results  Component Value  Date   HGBA1C 5.2 07/11/2018   HGBA1C 5.3 09/27/2017   Lab Results  Component Value Date   INSULIN 21.0 07/11/2018   CBC    Component Value Date/Time   WBC 6.2 07/11/2018 1404   WBC 9.9 09/27/2017 1549   RBC 4.56 07/11/2018 1404   RBC 4.90 09/27/2017 1549   HGB 14.3 07/11/2018 1404   HGB 15.9 02/26/2016 1522   HCT 42.8 07/11/2018 1404   HCT 45.5 02/26/2016 1522   PLT 253.0 09/27/2017 1549   PLT 236 02/26/2016 1522   MCV 94 07/11/2018 1404   MCV 93 02/26/2016 1522   MCH 31.4 07/11/2018 1404   MCH 32.6 02/26/2016 1522  MCH 32.4 01/28/2016 1035   MCHC 33.4 07/11/2018 1404   MCHC 34.3 09/27/2017 1549   RDW 13.7 07/11/2018 1404   RDW 13.6 02/26/2016 1522   LYMPHSABS 1.7 07/11/2018 1404   LYMPHSABS 1.7 02/26/2016 1522   EOSABS 0.1 07/11/2018 1404   EOSABS 0.2 02/26/2016 1522   BASOSABS 0.1 07/11/2018 1404   BASOSABS 0.0 02/26/2016 1522   Iron/TIBC/Ferritin/ %Sat    Component Value Date/Time   IRON 65 02/26/2016 1521   TIBC 280 02/26/2016 1521   FERRITIN 90 02/26/2016 1521   IRONPCTSAT 23 02/26/2016 1521   Lipid Panel     Component Value Date/Time   CHOL 220 (H) 07/11/2018 1404   TRIG 316 (H) 07/11/2018 1404   HDL 39 (L) 07/11/2018 1404   CHOLHDL 5 09/27/2017 1549   VLDL 36.4 09/27/2017 1549   LDLCALC 118 (H) 07/11/2018 1404   Hepatic Function Panel     Component Value Date/Time   PROT 6.9 07/11/2018 1404   ALBUMIN 4.2 07/11/2018 1404   ALBUMIN 3.9 02/26/2016 1522   AST 22 07/11/2018 1404   AST 19 02/26/2016 1522   ALT 22 07/11/2018 1404   ALT 21 02/26/2016 1522   ALKPHOS 92 07/11/2018 1404   ALKPHOS 75 02/26/2016 1522   BILITOT <0.2 07/11/2018 1404      Component Value Date/Time   TSH 2.260 07/11/2018 1404   TSH 1.25 09/27/2017 1549   TSH 0.94 11/20/2015 1012      OBESITY BEHAVIORAL INTERVENTION VISIT  Today's visit was # 5   Starting weight: 220 lbs Starting date: 07/11/18 Today's weight : 201 lbs  Today's date: 09/12/2018 Total lbs  lost to date: 60    ASK: We discussed the diagnosis of obesity with Neville Route today and Kendre agreed to give [place] permission to discuss obesity behavioral modification therapy today.  ASSESS: Cassie has the diagnosis of obesity and her BMI today is 35.61 Lorine is in the action stage of change   ADVISE: Layney was educated on the multiple health risks of obesity as well as the benefit of weight loss to improve her health. She was advised of the need for long term treatment and the importance of lifestyle modifications to improve her current health and to decrease her risk of future health problems.  AGREE: Multiple dietary modification options and treatment options were discussed and  Siearra agreed to follow the recommendations documented in the above note.  ARRANGE: Breiana was educated on the importance of frequent visits to treat obesity as outlined per CMS and USPSTF guidelines and agreed to schedule her next follow up appointment today.  I, Trixie Dredge, am acting as transcriptionist for Dennard Nip, MD  I have reviewed the above documentation for accuracy and completeness, and I agree with the above. -Dennard Nip, MD

## 2018-10-03 ENCOUNTER — Ambulatory Visit
Admission: RE | Admit: 2018-10-03 | Discharge: 2018-10-03 | Disposition: A | Discharge status: Home / Self Care | Payer: BLUE CROSS/BLUE SHIELD | Source: Ambulatory Visit | Attending: Obstetrics & Gynecology | Admitting: Obstetrics & Gynecology

## 2018-10-03 ENCOUNTER — Encounter: Payer: Self-pay | Admitting: Obstetrics & Gynecology

## 2018-10-03 DIAGNOSIS — M8589 Other specified disorders of bone density and structure, multiple sites: Secondary | ICD-10-CM | POA: Diagnosis not present

## 2018-10-03 DIAGNOSIS — Z780 Asymptomatic menopausal state: Secondary | ICD-10-CM | POA: Diagnosis not present

## 2018-10-03 DIAGNOSIS — Z1231 Encounter for screening mammogram for malignant neoplasm of breast: Secondary | ICD-10-CM | POA: Diagnosis not present

## 2018-10-03 DIAGNOSIS — E2839 Other primary ovarian failure: Secondary | ICD-10-CM

## 2018-10-04 ENCOUNTER — Telehealth: Payer: Self-pay | Admitting: Obstetrics & Gynecology

## 2018-10-04 ENCOUNTER — Other Ambulatory Visit: Payer: Self-pay | Admitting: Obstetrics & Gynecology

## 2018-10-04 DIAGNOSIS — Z1231 Encounter for screening mammogram for malignant neoplasm of breast: Secondary | ICD-10-CM

## 2018-10-04 NOTE — Telephone Encounter (Signed)
Routing to Dr. Sabra Heck for advise regarding calcium intake.

## 2018-10-04 NOTE — Telephone Encounter (Signed)
Patient sent the following correspondence through [place]. Routing to triage to assist patient with request.  Happy New Year Dr. Sabra Heck!    I am taking centrum silver which gives me 300 mg if calcium daily, plus i am following Dr. Migdalia Rivers diet still where I drink a cup of Fairlife fat free milk daily for 380 g of calcium, eat yogurt daily and cheese for another combined 45% of daily calcium. Should i double up on the centrum silver vitamin or just take an over the counter calcium vitamin ? If so how much do you recommend? Dr. Noah Rivers has me taking Vitamin D once a week prescription. I am losing weight on her program, she said ive lost 10% of my body weight since i began in October. I feel great and confident that i can lose the weight in her program. Cant thank you enough for recommending her! Hope you are well.

## 2018-10-05 ENCOUNTER — Encounter (INDEPENDENT_AMBULATORY_CARE_PROVIDER_SITE_OTHER): Payer: Self-pay | Admitting: Family Medicine

## 2018-10-05 NOTE — Telephone Encounter (Signed)
Message to patient via mychart from Dr. Sabra Heck regarding calcium.  Will close encounter.

## 2018-10-05 NOTE — Telephone Encounter (Signed)
It looks like she is getting about three dairy servings a day and she is adding 300mg  calcium daily.  I don't think she needs to add anything else.  This should be about 1200mg  calcium so that is great.

## 2018-10-11 ENCOUNTER — Encounter: Payer: Self-pay | Admitting: Internal Medicine

## 2018-10-11 ENCOUNTER — Ambulatory Visit (INDEPENDENT_AMBULATORY_CARE_PROVIDER_SITE_OTHER): Payer: BLUE CROSS/BLUE SHIELD | Admitting: Family Medicine

## 2018-10-11 ENCOUNTER — Encounter (INDEPENDENT_AMBULATORY_CARE_PROVIDER_SITE_OTHER): Payer: Self-pay | Admitting: Family Medicine

## 2018-10-11 VITALS — BP 129/78 | HR 69 | Temp 97.9°F | Ht 63.0 in | Wt 203.0 lb

## 2018-10-11 DIAGNOSIS — Z6836 Body mass index (BMI) 36.0-36.9, adult: Secondary | ICD-10-CM

## 2018-10-11 DIAGNOSIS — Z9189 Other specified personal risk factors, not elsewhere classified: Secondary | ICD-10-CM

## 2018-10-11 DIAGNOSIS — E559 Vitamin D deficiency, unspecified: Secondary | ICD-10-CM | POA: Diagnosis not present

## 2018-10-11 DIAGNOSIS — I10 Essential (primary) hypertension: Secondary | ICD-10-CM

## 2018-10-11 MED ORDER — VITAMIN D (ERGOCALCIFEROL) 1.25 MG (50000 UNIT) PO CAPS: 50000.0000 [IU] | ORAL_CAPSULE | ORAL | 0 refills | Status: DC

## 2018-10-12 NOTE — Progress Notes (Signed)
Office: [phone]  /  Fax: [phone]   HPI:   Chief Complaint: OBESITY Beth Rivers is here to discuss her progress with her obesity treatment plan. She is on the Category 3 plan and is following her eating plan approximately 92 % of the time. She states she is walking 10,000 steps 7 times per week. Beth Rivers has gained weight over the holidays, but is ready to get back on track. She is not measuring her dinner as closely and is likely not meeting her protein goal.  Her weight is 203 lb (92.1 kg) today and has gained 2 pounds since her last visit. She has lost 17 lbs since starting treatment with [place].  Vitamin D Deficiency [name] has a diagnosis of vitamin D deficiency. She is stable on prescription Vit D. She has a new diagnosis of osteopenia and she is eating a higher Ca+ diet. She still notes fatigue and denies nausea, vomiting or muscle weakness.  Hypertension Beth Rivers is a 63 y.o. female with hypertension. [name]'s blood pressure is stable with diet. She has skipped her Maxzide more often and she is retaining some fluid today, and feels more bloated. She denies chest pain. She is working weight loss to help control her blood pressure with the goal of decreasing her risk of heart attack and stroke. [name]'s blood pressure is currently controlled.  At risk for cardiovascular disease [name] is at a higher than average risk for cardiovascular disease due to obesity and hypertension. She currently denies any chest pain.  ASSESSMENT AND PLAN:  Vitamin D deficiency - Plan: Vitamin D, Ergocalciferol, (DRISDOL) 1.25 MG (50000 UT) CAPS capsule  Essential hypertension  At risk for heart disease  Class 2 severe obesity with serious comorbidity and body mass index (BMI) of 36.0 to 36.9 in adult, unspecified obesity type ([place])  PLAN:  Vitamin D Deficiency [name] was informed that low vitamin D levels contributes to fatigue and are associated with obesity, breast, and colon cancer. [name] agrees to  continue taking prescription Vit D @50 ,000 IU every week #4 and we will refill for 1 month. She will follow up for routine testing of vitamin D, at least 2-3 times per year. She was informed of the risk of over-replacement of vitamin D and agrees to not increase her dose unless she discusses this with [place] first. Beth Rivers agrees to follow up with our clinic in 1 to 2 weeks with [name], our registered dietitian, and in 3 weeks with myself.  Hypertension We discussed sodium restriction, working on healthy weight loss, and a regular exercise program as the means to achieve improved blood pressure control. Alissa agreed with this plan and agreed to follow up as directed. We will continue to monitor her blood pressure as well as her progress with the above lifestyle modifications. Beth Rivers agrees to restart Maxzide, and will continue with her diet. She will watch for signs of hypotension as she continues her lifestyle modifications. Beth Rivers agrees to follow up with our clinic in 1 to 2 weeks with [name], our registered dietitian, and in 3 weeks with myself.  Cardiovascular risk counselling [name] was given extended (15 minutes) coronary artery disease prevention counseling today. She is 63 y.o. female and has risk factors for heart disease including obesity and hypertension. We discussed intensive lifestyle modifications today with an emphasis on specific weight loss instructions and strategies. Pt was also informed of the importance of increasing exercise and decreasing saturated fats to help prevent heart disease.  Obesity Beth Rivers is currently in the  action stage of change. As such, her goal is to continue with weight loss efforts She has agreed to follow the Category 3 plan Beth Rivers has been instructed to work up to a goal of 150 minutes of combined cardio and strengthening exercise per week for weight loss and overall health benefits. We discussed the following Behavioral Modification Strategies today: increasing lean  protein intake, decreasing simple carbohydrates, work on meal planning and easy cooking plans, and increase H20 intake   [name] has agreed to follow up with our clinic in 1 to 2 weeks with [name], our registered dietitian, and in 3 weeks with myself. She was informed of the importance of frequent follow up visits to maximize her success with intensive lifestyle modifications for her multiple health conditions.  ALLERGIES: No Known Allergies  MEDICATIONS: Current Outpatient Medications on File Prior to Visit  Medication Sig Dispense Refill  . triamterene-hydrochlorothiazide (MAXZIDE-25) 37.5-25 MG tablet Take 1 tablet by mouth daily. 30 tablet 6   No current facility-administered medications on file prior to visit.     PAST MEDICAL HISTORY: Past Medical History:  Diagnosis Date  . Anxiety   . Depression 09/2001  . Elevated BP   . Elevated LDL cholesterol level 1/14  . Feeling lonely   . History of uterine fibroid 11/2002  . HTN (hypertension)   . Joint pain   . Leg edema   . Obesity   . Obstructive sleep apnea     PAST SURGICAL HISTORY: History reviewed. No pertinent surgical history.  SOCIAL HISTORY: Social History   Tobacco Use  . Smoking status: Never Smoker  . Smokeless tobacco: Never Used  Substance Use Topics  . Alcohol use: Yes    Alcohol/week: 1.0 standard drinks    Types: 1 Glasses of wine per week  . Drug use: No    FAMILY HISTORY: Family History  Problem Relation Age of Onset  . Hypertension Mother   . Hyperlipidemia Mother   . Depression Mother   . Obesity Mother   . Hypertension Father   . Heart attack Father   . Obesity Father   . Hyperlipidemia Father   . Heart disease Father   . Sudden death Daughter   . Colon cancer Maternal Grandmother     ROS: Review of Systems  Constitutional: Positive for malaise/fatigue. Negative for weight loss.  Cardiovascular: Negative for chest pain.  Gastrointestinal: Negative for nausea and vomiting.    Musculoskeletal:       Negative muscle weakness    PHYSICAL EXAM: Blood pressure 129/78, pulse 69, temperature 97.9 F (36.6 C), temperature source Oral, height 5\' 3"  (1.6 m), weight 203 lb (92.1 kg), last menstrual period 09/22/2003, SpO2 96 %. Body mass index is 35.96 kg/m. Physical Exam Vitals signs reviewed.  Constitutional:      Appearance: Normal appearance. She is obese.  Cardiovascular:     Rate and Rhythm: Normal rate.     Pulses: Normal pulses.  Pulmonary:     Effort: Pulmonary effort is normal.     Breath sounds: Normal breath sounds.  Musculoskeletal: Normal range of motion.  Skin:    General: Skin is warm and dry.  Neurological:     Mental Status: She is alert and oriented to person, place, and time.  Psychiatric:        Mood and Affect: Mood normal.        Behavior: Behavior normal.     RECENT LABS AND TESTS: BMET    Component Value Date/Time  NA 143 07/11/2018 1404   K 4.2 07/11/2018 1404   K 3.9 02/26/2016 1522   CL 105 07/11/2018 1404   CL 103 02/26/2016 1522   CO2 24 07/11/2018 1404   CO2 26 02/26/2016 1522   GLUCOSE 90 07/11/2018 1404   GLUCOSE 99 09/27/2017 1549   BUN 18 07/11/2018 1404   CREATININE 0.52 (L) 07/11/2018 1404   CREATININE 0.64 02/26/2016 1522   CREATININE 0.84 01/28/2016 1035   CALCIUM 8.7 07/11/2018 1404   CALCIUM 8.8 02/26/2016 1522   GFRNONAA 103 07/11/2018 1404   GFRAA 119 07/11/2018 1404   Lab Results  Component Value Date   HGBA1C 5.2 07/11/2018   HGBA1C 5.3 09/27/2017   Lab Results  Component Value Date   INSULIN 21.0 07/11/2018   CBC    Component Value Date/Time   WBC 6.2 07/11/2018 1404   WBC 9.9 09/27/2017 1549   RBC 4.56 07/11/2018 1404   RBC 4.90 09/27/2017 1549   HGB 14.3 07/11/2018 1404   HGB 15.9 02/26/2016 1522   HCT 42.8 07/11/2018 1404   HCT 45.5 02/26/2016 1522   PLT 253.0 09/27/2017 1549   PLT 236 02/26/2016 1522   MCV 94 07/11/2018 1404   MCV 93 02/26/2016 1522   MCH 31.4 07/11/2018  1404   MCH 32.6 02/26/2016 1522   MCH 32.4 01/28/2016 1035   MCHC 33.4 07/11/2018 1404   MCHC 34.3 09/27/2017 1549   RDW 13.7 07/11/2018 1404   RDW 13.6 02/26/2016 1522   LYMPHSABS 1.7 07/11/2018 1404   LYMPHSABS 1.7 02/26/2016 1522   EOSABS 0.1 07/11/2018 1404   EOSABS 0.2 02/26/2016 1522   BASOSABS 0.1 07/11/2018 1404   BASOSABS 0.0 02/26/2016 1522   Iron/TIBC/Ferritin/ %Sat    Component Value Date/Time   IRON 65 02/26/2016 1521   TIBC 280 02/26/2016 1521   FERRITIN 90 02/26/2016 1521   IRONPCTSAT 23 02/26/2016 1521   Lipid Panel     Component Value Date/Time   CHOL 220 (H) 07/11/2018 1404   TRIG 316 (H) 07/11/2018 1404   HDL 39 (L) 07/11/2018 1404   CHOLHDL 5 09/27/2017 1549   VLDL 36.4 09/27/2017 1549   LDLCALC 118 (H) 07/11/2018 1404   Hepatic Function Panel     Component Value Date/Time   PROT 6.9 07/11/2018 1404   ALBUMIN 4.2 07/11/2018 1404   ALBUMIN 3.9 02/26/2016 1522   AST 22 07/11/2018 1404   AST 19 02/26/2016 1522   ALT 22 07/11/2018 1404   ALT 21 02/26/2016 1522   ALKPHOS 92 07/11/2018 1404   ALKPHOS 75 02/26/2016 1522   BILITOT <0.2 07/11/2018 1404      Component Value Date/Time   TSH 2.260 07/11/2018 1404   TSH 1.25 09/27/2017 1549   TSH 0.94 11/20/2015 1012      OBESITY BEHAVIORAL INTERVENTION VISIT  Today's visit was # 6   Starting weight: 220 lbs Starting date: 07/11/18 Today's weight : 203 lbs  Today's date: 10/11/2018 Total lbs lost to date: 59    ASK: We discussed the diagnosis of obesity with Neville Route today and Sinda agreed to give [place] permission to discuss obesity behavioral modification therapy today.  ASSESS: Akosua has the diagnosis of obesity and her BMI today is 35.97 Clorinda is in the action stage of change   ADVISE: Jurney was educated on the multiple health risks of obesity as well as the benefit of weight loss to improve her health. She was advised of the need for long term treatment and  the importance of  lifestyle modifications to improve her current health and to decrease her risk of future health problems.  AGREE: Multiple dietary modification options and treatment options were discussed and  Edyth agreed to follow the recommendations documented in the above note.  ARRANGE: Catrinia was educated on the importance of frequent visits to treat obesity as outlined per CMS and USPSTF guidelines and agreed to schedule her next follow up appointment today.  I, Trixie Dredge, am acting as transcriptionist for Dennard Nip, MD  I have reviewed the above documentation for accuracy and completeness, and I agree with the above. -Dennard Nip, MD

## 2018-10-13 ENCOUNTER — Other Ambulatory Visit (INDEPENDENT_AMBULATORY_CARE_PROVIDER_SITE_OTHER): Payer: Self-pay | Admitting: Family Medicine

## 2018-10-13 DIAGNOSIS — E559 Vitamin D deficiency, unspecified: Secondary | ICD-10-CM

## 2018-10-21 ENCOUNTER — Encounter: Payer: Self-pay | Admitting: Internal Medicine

## 2018-11-02 ENCOUNTER — Encounter (INDEPENDENT_AMBULATORY_CARE_PROVIDER_SITE_OTHER): Payer: Self-pay

## 2018-11-02 ENCOUNTER — Ambulatory Visit (INDEPENDENT_AMBULATORY_CARE_PROVIDER_SITE_OTHER): Payer: Self-pay | Admitting: Family Medicine

## 2018-11-03 ENCOUNTER — Ambulatory Visit (INDEPENDENT_AMBULATORY_CARE_PROVIDER_SITE_OTHER): Payer: Self-pay | Admitting: Psychology

## 2018-11-03 ENCOUNTER — Encounter (INDEPENDENT_AMBULATORY_CARE_PROVIDER_SITE_OTHER): Payer: Self-pay | Admitting: Physician Assistant

## 2018-11-03 ENCOUNTER — Ambulatory Visit (INDEPENDENT_AMBULATORY_CARE_PROVIDER_SITE_OTHER): Payer: BLUE CROSS/BLUE SHIELD | Admitting: Physician Assistant

## 2018-11-03 VITALS — BP 115/74 | HR 70 | Temp 98.1°F | Ht 63.0 in | Wt 199.0 lb

## 2018-11-03 DIAGNOSIS — Z6835 Body mass index (BMI) 35.0-35.9, adult: Secondary | ICD-10-CM

## 2018-11-03 DIAGNOSIS — Z9189 Other specified personal risk factors, not elsewhere classified: Secondary | ICD-10-CM

## 2018-11-03 DIAGNOSIS — E7849 Other hyperlipidemia: Secondary | ICD-10-CM | POA: Diagnosis not present

## 2018-11-03 DIAGNOSIS — E559 Vitamin D deficiency, unspecified: Secondary | ICD-10-CM | POA: Diagnosis not present

## 2018-11-03 DIAGNOSIS — E8881 Metabolic syndrome: Secondary | ICD-10-CM

## 2018-11-03 MED ORDER — VITAMIN D (ERGOCALCIFEROL) 1.25 MG (50000 UNIT) PO CAPS: 50000.0000 [IU] | ORAL_CAPSULE | ORAL | 0 refills | Status: DC

## 2018-11-03 NOTE — Progress Notes (Signed)
Office: [phone]  /  Fax: [phone]   HPI:   Chief Complaint: OBESITY Beth Rivers is here to discuss her progress with her obesity treatment plan. She is on the Category 3 plan and is following her eating plan approximately 100% of the time. She states she is getting in 10,000 steps 7 times per week. Beth Rivers did very well with weight loss. She reports following the plan closely and not being tempted by simple carbs. She notes some hunger in the evening.  Her weight is 199 lb (90.3 kg) today and has had a weight loss of 4 pounds over a period of 3 weeks since her last visit. She has lost 21 lbs since starting treatment with [place].  Vitamin D deficiency Beth Rivers has a diagnosis of Vitamin D deficiency. She is currently taking prescription Vit D and denies nausea, vomiting or muscle weakness.  Insulin Resistance Beth Rivers has a diagnosis of insulin resistance based on her elevated fasting insulin level >5. Although Beth Rivers blood glucose readings are still under good control, insulin resistance puts her at greater risk of metabolic syndrome and diabetes. She is not taking metformin currently and continues to work on diet and exercise to decrease risk of diabetes. She denies polyphagia.  Hyperlipidemia Beth Rivers has hyperlipidemia and has been trying to improve her cholesterol levels with intensive lifestyle modification including a low saturated fat diet, exercise and weight loss. She denies any chest pain. Beth Rivers is on no medications.  At risk for osteopenia and osteoporosis Beth Rivers is at higher risk of osteopenia and osteoporosis due to Vitamin D deficiency.   ASSESSMENT AND PLAN:  Vitamin D deficiency - Plan: VITAMIN D 25 Hydroxy (Vit-D Deficiency, Fractures), Vitamin D, Ergocalciferol, (DRISDOL) 1.25 MG (50000 UT) CAPS capsule  Insulin resistance - Plan: Comprehensive metabolic panel, Hemoglobin A1c, Insulin, random  Other hyperlipidemia - Plan: Lipid Panel With LDL/HDL Ratio  At risk for  osteoporosis  Class 2 severe obesity with serious comorbidity and body mass index (BMI) of 35.0 to 35.9 in adult, unspecified obesity type (HCC)  PLAN:  Vitamin D Deficiency Beth Rivers was informed that low Vitamin D levels contributes to fatigue and are associated with obesity, breast, and colon cancer. She agrees to continue to take prescription Vit D @ 50,000 IU every week #4 with no refills and will have labs drawn today. She was informed of the risk of over-replacement of Vitamin D and agrees to not increase her dose unless she discusses this with [place] first. Beth Rivers agrees to follow-up with our clinic in 2 weeks.  Insulin Resistance Beth Rivers will continue to work on weight loss, exercise, and decreasing simple carbohydrates in her diet to help decrease the risk of diabetes. We dicussed metformin including benefits and risks. She was informed that eating too many simple carbohydrates or too many calories at one sitting increases the likelihood of GI side effects. Beth Rivers is not on metformin for now and prescription was not written today. Beth Rivers agreed to follow up with [place] as directed to monitor her progress. We will check labs today.  Hyperlipidemia Beth Rivers was informed of the American Heart Association Guidelines emphasizing intensive lifestyle modifications as the first line treatment for hyperlipidemia. We discussed many lifestyle modifications today in depth, and Beth Rivers will continue to work on decreasing saturated fats such as fatty red meat, butter and many fried foods. She will also increase vegetables and lean protein in her diet and continue to work on exercise and weight loss efforts. We will check labs today.  At risk  for osteopenia and osteoporosis Beth Rivers was given extended  (15 minutes) osteoporosis prevention counseling today. Beth Rivers is at risk for osteopenia and osteoporsis due to her Vitamin D deficiency. She was encouraged to take her Vitamin D and follow her higher calcium diet and increase  strengthening exercise to help strengthen her bones and decrease her risk of osteopenia and osteoporosis.  Obesity Beth Rivers is currently in the action stage of change. As such, her goal is to continue with weight loss efforts. She has agreed to follow the Category 3 plan. Beth Rivers has been instructed to work up to a goal of 150 minutes of combined cardio and strengthening exercise per week for weight loss and overall health benefits. We discussed the following Behavioral Modification Strategies today: work on meal planning and easy cooking plans and planning for success.  Beth Rivers has agreed to follow-up with our clinic in 2 weeks. She was informed of the importance of frequent follow up visits to maximize her success with intensive lifestyle modifications for her multiple health conditions.  ALLERGIES: No Known Allergies  MEDICATIONS: Current Outpatient Medications on File Prior to Visit  Medication Sig Dispense Refill  . triamterene-hydrochlorothiazide (MAXZIDE-25) 37.5-25 MG tablet Take 1 tablet by mouth daily. 30 tablet 6   No current facility-administered medications on file prior to visit.     PAST MEDICAL HISTORY: Past Medical History:  Diagnosis Date  . Anxiety   . Depression 09/2001  . Elevated BP   . Elevated LDL cholesterol level 1/14  . Feeling lonely   . History of uterine fibroid 11/2002  . HTN (hypertension)   . Joint pain   . Leg edema   . Obesity   . Obstructive sleep apnea     PAST SURGICAL HISTORY: History reviewed. No pertinent surgical history.  SOCIAL HISTORY: Social History   Tobacco Use  . Smoking status: Never Smoker  . Smokeless tobacco: Never Used  Substance Use Topics  . Alcohol use: Yes    Alcohol/week: 1.0 standard drinks    Types: 1 Glasses of wine per week  . Drug use: No    FAMILY HISTORY: Family History  Problem Relation Age of Onset  . Hypertension Mother   . Hyperlipidemia Mother   . Depression Mother   . Obesity Mother   .  Hypertension Father   . Heart attack Father   . Obesity Father   . Hyperlipidemia Father   . Heart disease Father   . Sudden death Daughter   . Colon cancer Maternal Grandmother     ROS: Review of Systems  Constitutional: Positive for weight loss.  Cardiovascular: Negative for chest pain.  Gastrointestinal: Negative for nausea and vomiting.  Musculoskeletal:       Negative for muscle weakness.  Endo/Heme/Allergies:       Negative for polyphagia. Negative for hypoglycemia.   PHYSICAL EXAM: Blood pressure 115/74, pulse 70, temperature 98.1 F (36.7 C), temperature source Oral, height 5\' 3"  (1.6 m), weight 199 lb (90.3 kg), last menstrual period 09/22/2003, SpO2 97 %. Body mass index is 35.25 kg/m. Physical Exam Vitals signs reviewed.  Constitutional:      Appearance: Normal appearance. She is obese.  Cardiovascular:     Rate and Rhythm: Normal rate.     Pulses: Normal pulses.  Pulmonary:     Effort: Pulmonary effort is normal.     Breath sounds: Normal breath sounds.  Musculoskeletal: Normal range of motion.  Skin:    General: Skin is warm and dry.  Neurological:  Mental Status: She is alert and oriented to person, place, and time.  Psychiatric:        Behavior: Behavior normal.   RECENT LABS AND TESTS: BMET    Component Value Date/Time   NA 143 07/11/2018 1404   K 4.2 07/11/2018 1404   K 3.9 02/26/2016 1522   CL 105 07/11/2018 1404   CL 103 02/26/2016 1522   CO2 24 07/11/2018 1404   CO2 26 02/26/2016 1522   GLUCOSE 90 07/11/2018 1404   GLUCOSE 99 09/27/2017 1549   BUN 18 07/11/2018 1404   CREATININE 0.52 (L) 07/11/2018 1404   CREATININE 0.64 02/26/2016 1522   CREATININE 0.84 01/28/2016 1035   CALCIUM 8.7 07/11/2018 1404   CALCIUM 8.8 02/26/2016 1522   GFRNONAA 103 07/11/2018 1404   GFRAA 119 07/11/2018 1404   Lab Results  Component Value Date   HGBA1C 5.2 07/11/2018   HGBA1C 5.3 09/27/2017   Lab Results  Component Value Date   INSULIN 21.0  07/11/2018   CBC    Component Value Date/Time   WBC 6.2 07/11/2018 1404   WBC 9.9 09/27/2017 1549   RBC 4.56 07/11/2018 1404   RBC 4.90 09/27/2017 1549   HGB 14.3 07/11/2018 1404   HGB 15.9 02/26/2016 1522   HCT 42.8 07/11/2018 1404   HCT 45.5 02/26/2016 1522   PLT 253.0 09/27/2017 1549   PLT 236 02/26/2016 1522   MCV 94 07/11/2018 1404   MCV 93 02/26/2016 1522   MCH 31.4 07/11/2018 1404   MCH 32.6 02/26/2016 1522   MCH 32.4 01/28/2016 1035   MCHC 33.4 07/11/2018 1404   MCHC 34.3 09/27/2017 1549   RDW 13.7 07/11/2018 1404   RDW 13.6 02/26/2016 1522   LYMPHSABS 1.7 07/11/2018 1404   LYMPHSABS 1.7 02/26/2016 1522   EOSABS 0.1 07/11/2018 1404   EOSABS 0.2 02/26/2016 1522   BASOSABS 0.1 07/11/2018 1404   BASOSABS 0.0 02/26/2016 1522   Iron/TIBC/Ferritin/ %Sat    Component Value Date/Time   IRON 65 02/26/2016 1521   TIBC 280 02/26/2016 1521   FERRITIN 90 02/26/2016 1521   IRONPCTSAT 23 02/26/2016 1521   Lipid Panel     Component Value Date/Time   CHOL 220 (H) 07/11/2018 1404   TRIG 316 (H) 07/11/2018 1404   HDL 39 (L) 07/11/2018 1404   CHOLHDL 5 09/27/2017 1549   VLDL 36.4 09/27/2017 1549   LDLCALC 118 (H) 07/11/2018 1404   Hepatic Function Panel     Component Value Date/Time   PROT 6.9 07/11/2018 1404   ALBUMIN 4.2 07/11/2018 1404   ALBUMIN 3.9 02/26/2016 1522   AST 22 07/11/2018 1404   AST 19 02/26/2016 1522   ALT 22 07/11/2018 1404   ALT 21 02/26/2016 1522   ALKPHOS 92 07/11/2018 1404   ALKPHOS 75 02/26/2016 1522   BILITOT <0.2 07/11/2018 1404      Component Value Date/Time   TSH 2.260 07/11/2018 1404   TSH 1.25 09/27/2017 1549   TSH 0.94 11/20/2015 1012   Results for JILLANE, PO (MRN [id number]) as of 11/03/2018 12:36  Ref. Range 07/11/2018 14:04  Vitamin D, 25-Hydroxy Latest Ref Range: 30.0 - 100.0 ng/mL 16.1 (L)   OBESITY BEHAVIORAL INTERVENTION VISIT  Today's visit was #7  Starting weight: 220 lbs Starting date: 07/11/2018 Today's  weight: 199 lbs  Today's date: 11/03/2018 Total lbs lost to date: 21  ASK: We discussed the diagnosis of obesity with Neville Route today and Cathyrn agreed to give [place] permission to discuss obesity  behavioral modification therapy today.  ASSESS: Glorianne has the diagnosis of obesity and her BMI today is 35.25. Nile is in the action stage of change.   ADVISE: Nafisa was educated on the multiple health risks of obesity as well as the benefit of weight loss to improve her health. She was advised of the need for long term treatment and the importance of lifestyle modifications to improve her current health and to decrease her risk of future health problems.  AGREE: Multiple dietary modification options and treatment options were discussed and  Emelyn agreed to follow the recommendations documented in the above note.  ARRANGE: Makhayla was educated on the importance of frequent visits to treat obesity as outlined per CMS and USPSTF guidelines and agreed to schedule her next follow up appointment today.  Migdalia Dk, am acting as transcriptionist for Abby Potash, PA-C I, Abby Potash, PA-C have reviewed above note and agree with its content

## 2018-11-04 LAB — COMPREHENSIVE METABOLIC PANEL
ALT: 37 IU/L — ABNORMAL HIGH (ref 0–32)
AST: 21 IU/L (ref 0–40)
Albumin/Globulin Ratio: 1.6 (ref 1.2–2.2)
Albumin: 4.2 g/dL (ref 3.8–4.8)
Alkaline Phosphatase: 68 IU/L (ref 39–117)
BUN/Creatinine Ratio: 44 — ABNORMAL HIGH (ref 12–28)
BUN: 28 mg/dL — ABNORMAL HIGH (ref 8–27)
Bilirubin Total: 0.3 mg/dL (ref 0.0–1.2)
CALCIUM: 9.2 mg/dL (ref 8.7–10.3)
CO2: 24 mmol/L (ref 20–29)
Chloride: 105 mmol/L (ref 96–106)
Creatinine, Ser: 0.64 mg/dL (ref 0.57–1.00)
GFR calc non Af Amer: 97 mL/min/{1.73_m2} (ref >59)
GFR, EST AFRICAN AMERICAN: 111 mL/min/{1.73_m2} (ref >59)
Globulin, Total: 2.6 g/dL (ref 1.5–4.5)
Glucose: 96 mg/dL (ref 65–99)
Potassium: 4.2 mmol/L (ref 3.5–5.2)
Sodium: 144 mmol/L (ref 134–144)
TOTAL PROTEIN: 6.8 g/dL (ref 6.0–8.5)

## 2018-11-04 LAB — LIPID PANEL WITH LDL/HDL RATIO
Cholesterol, Total: 197 mg/dL (ref 100–199)
HDL: 48 mg/dL (ref >39)
LDL Calculated: 135 mg/dL — ABNORMAL HIGH (ref 0–99)
LDl/HDL Ratio: 2.8 ratio (ref 0.0–3.2)
Triglycerides: 70 mg/dL (ref 0–149)
VLDL Cholesterol Cal: 14 mg/dL (ref 5–40)

## 2018-11-04 LAB — VITAMIN D 25 HYDROXY (VIT D DEFICIENCY, FRACTURES): Vit D, 25-Hydroxy: 52.8 ng/mL (ref 30.0–100.0)

## 2018-11-04 LAB — INSULIN, RANDOM: INSULIN: 10.6 u[IU]/mL (ref 2.6–24.9)

## 2018-11-04 LAB — HEMOGLOBIN A1C
Est. average glucose Bld gHb Est-mCnc: 103 mg/dL
Hgb A1c MFr Bld: 5.2 % (ref 4.8–5.6)

## 2018-11-09 ENCOUNTER — Ambulatory Visit (INDEPENDENT_AMBULATORY_CARE_PROVIDER_SITE_OTHER): Payer: Self-pay | Admitting: Dietician

## 2018-11-22 ENCOUNTER — Ambulatory Visit (INDEPENDENT_AMBULATORY_CARE_PROVIDER_SITE_OTHER): Payer: Self-pay | Admitting: Family Medicine

## 2018-11-24 ENCOUNTER — Ambulatory Visit (INDEPENDENT_AMBULATORY_CARE_PROVIDER_SITE_OTHER): Payer: BLUE CROSS/BLUE SHIELD | Admitting: Family Medicine

## 2018-11-24 ENCOUNTER — Encounter (INDEPENDENT_AMBULATORY_CARE_PROVIDER_SITE_OTHER): Payer: Self-pay | Admitting: Family Medicine

## 2018-11-24 VITALS — BP 113/77 | HR 78 | Ht 63.0 in | Wt 199.0 lb

## 2018-11-24 DIAGNOSIS — E860 Dehydration: Secondary | ICD-10-CM

## 2018-11-24 DIAGNOSIS — Z9189 Other specified personal risk factors, not elsewhere classified: Secondary | ICD-10-CM

## 2018-11-24 DIAGNOSIS — E559 Vitamin D deficiency, unspecified: Secondary | ICD-10-CM

## 2018-11-24 DIAGNOSIS — Z6835 Body mass index (BMI) 35.0-35.9, adult: Secondary | ICD-10-CM

## 2018-11-24 MED ORDER — VITAMIN D (ERGOCALCIFEROL) 1.25 MG (50000 UNIT) PO CAPS: 50000.0000 [IU] | ORAL_CAPSULE | ORAL | 0 refills | Status: DC

## 2018-11-25 DIAGNOSIS — G4733 Obstructive sleep apnea (adult) (pediatric): Secondary | ICD-10-CM | POA: Diagnosis not present

## 2018-11-26 NOTE — Progress Notes (Signed)
Office: [phone]  /  Fax: [phone]   HPI:   Chief Complaint: OBESITY Beth Rivers is here to discuss her progress with her obesity treatment plan. She is on the Category 3 plan and is following her eating plan approximately 100 % of the time. She states she is walking for 60 minutes 7 times per week. Beth Rivers has done well maintaining weight. She has had increased temptations, but has done well controlling them. Her hunger is controlled.  Her weight is 199 lb (90.3 kg) today and has not lost weight since her last visit. She has lost 21 lbs since starting treatment with [place].  Vitamin D Deficiency Beth Rivers has a diagnosis of vitamin D deficiency. She is on prescription Vit D, level is now at goal. She denies nausea, vomiting or muscle weakness and notes fatigue improved.  Dehydration Beth Rivers's BUN is elevated, she drinks very little H20. She denies lightheadedness.  At risk for cardiovascular disease Beth Rivers is at a higher than average risk for cardiovascular disease due to obesity. She currently denies any chest pain.  ASSESSMENT AND PLAN:  Vitamin D deficiency - Plan: Vitamin D, Ergocalciferol, (DRISDOL) 1.25 MG (50000 UT) CAPS capsule  Dehydration  At risk for heart disease  Class 2 severe obesity with serious comorbidity and body mass index (BMI) of 35.0 to 35.9 in adult, unspecified obesity type ([facility])  PLAN:  Vitamin D Deficiency Beth Rivers was informed that low vitamin D levels contributes to fatigue and are associated with obesity, breast, and colon cancer. [name] agrees to continue taking prescription Vit D @50 ,000 IU every week #4 and we will refill for 1 month. She will follow up for routine testing of vitamin D, at least 2-3 times per year. She was informed of the risk of over-replacement of vitamin D and agrees to not increase her dose unless she discusses this with [place] first. Beth Rivers agrees to follow up with our clinic in 2 to 3 weeks.  Dehydration Beth Rivers is to increase H20 intake to at  least 64 oz per day. Beth Rivers agrees to follow up with our clinic in 2 to 3 weeks.  Cardiovascular risk counseling Beth Rivers was given extended (15 minutes) coronary artery disease prevention counseling today. She is 63 y.o. female and has risk factors for heart disease including obesity. We discussed intensive lifestyle modifications today with an emphasis on specific weight loss instructions and strategies. Pt was also informed of the importance of increasing exercise and decreasing saturated fats to help prevent heart disease.  Obesity Beth Rivers is currently in the action stage of change. As such, her goal is to continue with weight loss efforts She has agreed to follow the Category 3 plan Beth Rivers has been instructed to work up to a goal of 150 minutes of combined cardio and strengthening exercise per week for weight loss and overall health benefits. We discussed the following Behavioral Modification Strategies today: increasing lean protein intake, decreasing simple carbohydrates, and work on meal planning and easy cooking plans   Beth Rivers has agreed to follow up with our clinic in 2 to 3 weeks. She was informed of the importance of frequent follow up visits to maximize her success with intensive lifestyle modifications for her multiple health conditions.  ALLERGIES: No Known Allergies  MEDICATIONS: Current Outpatient Medications on File Prior to Visit  Medication Sig Dispense Refill  . triamterene-hydrochlorothiazide (MAXZIDE-25) 37.5-25 MG tablet Take 1 tablet by mouth daily. 30 tablet 6   No current facility-administered medications on file prior to visit.  PAST MEDICAL HISTORY: Past Medical History:  Diagnosis Date  . Anxiety   . Depression 09/2001  . Elevated BP   . Elevated LDL cholesterol level 1/14  . Feeling lonely   . History of uterine fibroid 11/2002  . HTN (hypertension)   . Joint pain   . Leg edema   . Obesity   . Obstructive sleep apnea     PAST SURGICAL  HISTORY: History reviewed. No pertinent surgical history.  SOCIAL HISTORY: Social History   Tobacco Use  . Smoking status: Never Smoker  . Smokeless tobacco: Never Used  Substance Use Topics  . Alcohol use: Yes    Alcohol/week: 1.0 standard drinks    Types: 1 Glasses of wine per week  . Drug use: No    FAMILY HISTORY: Family History  Problem Relation Age of Onset  . Hypertension Mother   . Hyperlipidemia Mother   . Depression Mother   . Obesity Mother   . Hypertension Father   . Heart attack Father   . Obesity Father   . Hyperlipidemia Father   . Heart disease Father   . Sudden death Daughter   . Colon cancer Maternal Grandmother     ROS: Review of Systems  Constitutional: Positive for malaise/fatigue. Negative for weight loss.  Cardiovascular: Negative for chest pain.  Gastrointestinal: Negative for nausea and vomiting.  Musculoskeletal:       Negative muscle weakness  Neurological:       Negative lightheadedness    PHYSICAL EXAM: Blood pressure 113/77, pulse 78, height 5\' 3"  (1.6 m), weight 199 lb (90.3 kg), last menstrual period 09/22/2003, SpO2 96 %. Body mass index is 35.25 kg/m. Physical Exam Vitals signs reviewed.  Constitutional:      Appearance: Normal appearance. She is obese.  Cardiovascular:     Rate and Rhythm: Normal rate.     Pulses: Normal pulses.  Pulmonary:     Effort: Pulmonary effort is normal.     Breath sounds: Normal breath sounds.  Musculoskeletal: Normal range of motion.  Skin:    General: Skin is warm and dry.  Neurological:     Mental Status: She is alert and oriented to person, place, and time.  Psychiatric:        Mood and Affect: Mood normal.        Behavior: Behavior normal.     RECENT LABS AND TESTS: BMET    Component Value Date/Time   NA 144 11/03/2018 0947   K 4.2 11/03/2018 0947   K 3.9 02/26/2016 1522   CL 105 11/03/2018 0947   CL 103 02/26/2016 1522   CO2 24 11/03/2018 0947   CO2 26 02/26/2016 1522    GLUCOSE 96 11/03/2018 0947   GLUCOSE 99 09/27/2017 1549   BUN 28 (H) 11/03/2018 0947   CREATININE 0.64 11/03/2018 0947   CREATININE 0.64 02/26/2016 1522   CREATININE 0.84 01/28/2016 1035   CALCIUM 9.2 11/03/2018 0947   CALCIUM 8.8 02/26/2016 1522   GFRNONAA 97 11/03/2018 0947   GFRAA 111 11/03/2018 0947   Lab Results  Component Value Date   HGBA1C 5.2 11/03/2018   HGBA1C 5.2 07/11/2018   HGBA1C 5.3 09/27/2017   Lab Results  Component Value Date   INSULIN 10.6 11/03/2018   INSULIN 21.0 07/11/2018   CBC    Component Value Date/Time   WBC 6.2 07/11/2018 1404   WBC 9.9 09/27/2017 1549   RBC 4.56 07/11/2018 1404   RBC 4.90 09/27/2017 1549   HGB 14.3 07/11/2018  1404   HGB 15.9 02/26/2016 1522   HCT 42.8 07/11/2018 1404   HCT 45.5 02/26/2016 1522   PLT 253.0 09/27/2017 1549   PLT 236 02/26/2016 1522   MCV 94 07/11/2018 1404   MCV 93 02/26/2016 1522   MCH 31.4 07/11/2018 1404   MCH 32.6 02/26/2016 1522   MCH 32.4 01/28/2016 1035   MCHC 33.4 07/11/2018 1404   MCHC 34.3 09/27/2017 1549   RDW 13.7 07/11/2018 1404   RDW 13.6 02/26/2016 1522   LYMPHSABS 1.7 07/11/2018 1404   LYMPHSABS 1.7 02/26/2016 1522   EOSABS 0.1 07/11/2018 1404   EOSABS 0.2 02/26/2016 1522   BASOSABS 0.1 07/11/2018 1404   BASOSABS 0.0 02/26/2016 1522   Iron/TIBC/Ferritin/ %Sat    Component Value Date/Time   IRON 65 02/26/2016 1521   TIBC 280 02/26/2016 1521   FERRITIN 90 02/26/2016 1521   IRONPCTSAT 23 02/26/2016 1521   Lipid Panel     Component Value Date/Time   CHOL 197 11/03/2018 0947   TRIG 70 11/03/2018 0947   HDL 48 11/03/2018 0947   CHOLHDL 5 09/27/2017 1549   VLDL 36.4 09/27/2017 1549   LDLCALC 135 (H) 11/03/2018 0947   Hepatic Function Panel     Component Value Date/Time   PROT 6.8 11/03/2018 0947   ALBUMIN 4.2 11/03/2018 0947   ALBUMIN 3.9 02/26/2016 1522   AST 21 11/03/2018 0947   AST 19 02/26/2016 1522   ALT 37 (H) 11/03/2018 0947   ALT 21 02/26/2016 1522   ALKPHOS 68  11/03/2018 0947   ALKPHOS 75 02/26/2016 1522   BILITOT 0.3 11/03/2018 0947      Component Value Date/Time   TSH 2.260 07/11/2018 1404   TSH 1.25 09/27/2017 1549   TSH 0.94 11/20/2015 1012      OBESITY BEHAVIORAL INTERVENTION VISIT  Today's visit was # 8   Starting weight: 220 lbs Starting date: 07/11/18 Today's weight : 199 lbs  Today's date: 11/24/2018 Total lbs lost to date: 21    11/24/2018  Height 5\' 3"  (1.6 m)  Weight 199 lb (90.3 kg)  BMI (Calculated) 35.26  BLOOD PRESSURE - SYSTOLIC 465  BLOOD PRESSURE - DIASTOLIC 77   Body Fat % 03.5 %  Total Body Water (lbs) 74.8 lbs     ASK: We discussed the diagnosis of obesity with Beth Rivers today and Terree agreed to give [place] permission to discuss obesity behavioral modification therapy today.  ASSESS: Beth Rivers has the diagnosis of obesity and her BMI today is 35.26 Beth Rivers is in the action stage of change   ADVISE: Beth Rivers was educated on the multiple health risks of obesity as well as the benefit of weight loss to improve her health. She was advised of the need for long term treatment and the importance of lifestyle modifications to improve her current health and to decrease her risk of future health problems.  AGREE: Multiple dietary modification options and treatment options were discussed and  Beth Rivers agreed to follow the recommendations documented in the above note.  ARRANGE: Beth Rivers was educated on the importance of frequent visits to treat obesity as outlined per CMS and USPSTF guidelines and agreed to schedule her next follow up appointment today.  I, Beth Rivers, am acting as transcriptionist for Dennard Nip, MD  I have reviewed the above documentation for accuracy and completeness, and I agree with the above. -Dennard Nip, MD

## 2018-12-14 ENCOUNTER — Ambulatory Visit (INDEPENDENT_AMBULATORY_CARE_PROVIDER_SITE_OTHER): Payer: Self-pay | Admitting: Family Medicine

## 2018-12-14 ENCOUNTER — Encounter (INDEPENDENT_AMBULATORY_CARE_PROVIDER_SITE_OTHER): Payer: Self-pay

## 2018-12-15 ENCOUNTER — Encounter (INDEPENDENT_AMBULATORY_CARE_PROVIDER_SITE_OTHER): Payer: Self-pay

## 2018-12-26 ENCOUNTER — Encounter (INDEPENDENT_AMBULATORY_CARE_PROVIDER_SITE_OTHER): Payer: Self-pay | Admitting: Family Medicine

## 2018-12-26 ENCOUNTER — Other Ambulatory Visit (INDEPENDENT_AMBULATORY_CARE_PROVIDER_SITE_OTHER): Payer: Self-pay | Admitting: Family Medicine

## 2018-12-26 DIAGNOSIS — E559 Vitamin D deficiency, unspecified: Secondary | ICD-10-CM

## 2018-12-26 NOTE — Telephone Encounter (Signed)
Please schedule appt

## 2018-12-30 ENCOUNTER — Encounter (INDEPENDENT_AMBULATORY_CARE_PROVIDER_SITE_OTHER): Payer: Self-pay | Admitting: Family Medicine

## 2019-01-01 NOTE — Telephone Encounter (Signed)
Please advise 

## 2019-01-02 ENCOUNTER — Encounter (INDEPENDENT_AMBULATORY_CARE_PROVIDER_SITE_OTHER): Payer: Self-pay | Admitting: Family Medicine

## 2019-01-03 DIAGNOSIS — G4733 Obstructive sleep apnea (adult) (pediatric): Secondary | ICD-10-CM | POA: Diagnosis not present

## 2019-01-05 ENCOUNTER — Encounter (INDEPENDENT_AMBULATORY_CARE_PROVIDER_SITE_OTHER): Payer: Self-pay | Admitting: Family Medicine

## 2019-01-05 ENCOUNTER — Other Ambulatory Visit: Payer: Self-pay

## 2019-01-05 ENCOUNTER — Ambulatory Visit (INDEPENDENT_AMBULATORY_CARE_PROVIDER_SITE_OTHER): Payer: BLUE CROSS/BLUE SHIELD | Admitting: Family Medicine

## 2019-01-05 DIAGNOSIS — I10 Essential (primary) hypertension: Secondary | ICD-10-CM

## 2019-01-05 DIAGNOSIS — E559 Vitamin D deficiency, unspecified: Secondary | ICD-10-CM | POA: Diagnosis not present

## 2019-01-05 DIAGNOSIS — Z6835 Body mass index (BMI) 35.0-35.9, adult: Secondary | ICD-10-CM | POA: Diagnosis not present

## 2019-01-05 DIAGNOSIS — E66812 Obesity, class 2: Secondary | ICD-10-CM

## 2019-01-05 MED ORDER — VITAMIN D (ERGOCALCIFEROL) 1.25 MG (50000 UNIT) PO CAPS: 50000.0000 [IU] | ORAL_CAPSULE | ORAL | 0 refills | Status: DC

## 2019-01-05 MED ORDER — TRIAMTERENE-HCTZ 37.5-25 MG PO TABS: 1.0000 | ORAL_TABLET | Freq: Every day | ORAL | 2 refills | Status: DC

## 2019-01-09 ENCOUNTER — Encounter: Payer: Self-pay | Admitting: Internal Medicine

## 2019-01-09 ENCOUNTER — Encounter (INDEPENDENT_AMBULATORY_CARE_PROVIDER_SITE_OTHER): Payer: Self-pay | Admitting: Family Medicine

## 2019-01-09 ENCOUNTER — Ambulatory Visit (INDEPENDENT_AMBULATORY_CARE_PROVIDER_SITE_OTHER): Payer: BLUE CROSS/BLUE SHIELD | Admitting: Internal Medicine

## 2019-01-09 DIAGNOSIS — R202 Paresthesia of skin: Secondary | ICD-10-CM

## 2019-01-09 DIAGNOSIS — E559 Vitamin D deficiency, unspecified: Secondary | ICD-10-CM | POA: Insufficient documentation

## 2019-01-09 DIAGNOSIS — R200 Anesthesia of skin: Secondary | ICD-10-CM | POA: Diagnosis not present

## 2019-01-09 NOTE — Telephone Encounter (Signed)
Please review Dr. Leafy Ro out of office this week

## 2019-01-09 NOTE — Progress Notes (Signed)
Office: [phone]  /  Fax: [phone] TeleHealth Visit:  Beth Rivers has verbally consented to this TeleHealth visit today. The patient is located at home, the provider is located at the [facility] office. The participants in this visit include the listed provider and patient. The visit was conducted today via Webex.  HPI:   Chief Complaint: OBESITY Beth Rivers is here to discuss her progress with her obesity treatment plan. She is on the Category 3 plan and is following her eating plan approximately 95% of the time. She states she is exercising 0 minutes 0 times per week. Beth Rivers states she weighed 194 lbs today. She states she has not been eating all of her food and is skipping meals at times.  We were unable to weigh the patient today for this TeleHealth visit. She feels as if she has lost 6 lbs since her last visit. She has lost 21 lbs since starting treatment with [place].  Hypertension Beth Rivers is a 63 y.o. female with hypertension.  Shanese Riemenschneider denies chest pain or shortness of breath on exertion. She is working weight loss to help control her blood pressure with the goal of decreasing her risk of heart attack and stroke. Robinreports her blood pressure to be 144/80 and 136/70 at home, which is fairly well controlled.  Vitamin D deficiency Beth Rivers has a diagnosis of Vitamin D deficiency, which is at goal. She is currently taking prescription Vit D and denies nausea, vomiting or muscle weakness.  ASSESSMENT AND PLAN:  Vitamin D deficiency - Plan: Vitamin D, Ergocalciferol, (DRISDOL) 1.25 MG (50000 UT) CAPS capsule  Essential hypertension - Plan: triamterene-hydrochlorothiazide (MAXZIDE-25) 37.5-25 MG tablet  Class 2 severe obesity with serious comorbidity and body mass index (BMI) of 35.0 to 35.9 in adult, unspecified obesity type ([place])  PLAN:  Hypertension We discussed sodium restriction, working on healthy weight loss, and a regular exercise program as the means to  achieve improved blood pressure control. Beth Rivers agreed with this plan and agreed to follow up as directed. We will continue to monitor her blood pressure as well as her progress with the above lifestyle modifications. She was given a refill on her Maxzide 37.5/25 QD #30 with 0 refills and agrees to follow-up with our clinic in 2 weeks. She will continue to monitor her blood pressure at home and watch for signs of hypotension as she continues her lifestyle modifications.  Vitamin D Deficiency Beth Rivers was informed that low Vitamin D levels contributes to fatigue and are associated with obesity, breast, and colon cancer. She agrees to continue to take prescription Vit D @ 50,000 IU every week #4 with 0 refills and will follow-up for routine testing of Vitamin D. She was informed of the risk of over-replacement of Vitamin D and agrees to not increase her dose unless she discusses this with [place] first. Beth Rivers agrees to follow-up with our clinic in 2 weeks.  Obesity Beth Rivers is currently in the action stage of change. As such, her goal is to continue with weight loss efforts. She has agreed to follow the Category 3 plan. Beth Rivers has been instructed to walk for 30 minutes 2-3 times per week for weight loss and overall health benefits. We discussed the following Behavioral Modification Strategies today: increasing lean protein intake, increase H20 intake, no skipping meals, keeping healthy foods in the home, and planning for success.  Beth Rivers has agreed to follow-up with our clinic in 2 weeks. She was informed of the importance of frequent follow-up visits  to maximize her success with intensive lifestyle modifications for her multiple health conditions.  ALLERGIES: No Known Allergies  MEDICATIONS: No current outpatient medications on file prior to visit.   No current facility-administered medications on file prior to visit.     PAST MEDICAL HISTORY: Past Medical History:  Diagnosis Date  . Anxiety   .  Depression 09/2001  . Elevated BP   . Elevated LDL cholesterol level 1/14  . Feeling lonely   . History of uterine fibroid 11/2002  . HTN (hypertension)   . Joint pain   . Leg edema   . Obesity   . Obstructive sleep apnea     PAST SURGICAL HISTORY: History reviewed. No pertinent surgical history.  SOCIAL HISTORY: Social History   Tobacco Use  . Smoking status: Never Smoker  . Smokeless tobacco: Never Used  Substance Use Topics  . Alcohol use: Yes    Alcohol/week: 1.0 standard drinks    Types: 1 Glasses of wine per week  . Drug use: No    FAMILY HISTORY: Family History  Problem Relation Age of Onset  . Hypertension Mother   . Hyperlipidemia Mother   . Depression Mother   . Obesity Mother   . Hypertension Father   . Heart attack Father   . Obesity Father   . Hyperlipidemia Father   . Heart disease Father   . Sudden death Daughter   . Colon cancer Maternal Grandmother    ROS: Review of Systems  Respiratory: Negative for shortness of breath.   Cardiovascular: Negative for chest pain.  Gastrointestinal: Negative for nausea and vomiting.  Musculoskeletal:       Negative for muscle weakness.   PHYSICAL EXAM: Pt in no acute distress  RECENT LABS AND TESTS: BMET    Component Value Date/Time   NA 144 11/03/2018 0947   K 4.2 11/03/2018 0947   K 3.9 02/26/2016 1522   CL 105 11/03/2018 0947   CL 103 02/26/2016 1522   CO2 24 11/03/2018 0947   CO2 26 02/26/2016 1522   GLUCOSE 96 11/03/2018 0947   GLUCOSE 99 09/27/2017 1549   BUN 28 (H) 11/03/2018 0947   CREATININE 0.64 11/03/2018 0947   CREATININE 0.64 02/26/2016 1522   CREATININE 0.84 01/28/2016 1035   CALCIUM 9.2 11/03/2018 0947   CALCIUM 8.8 02/26/2016 1522   GFRNONAA 97 11/03/2018 0947   GFRAA 111 11/03/2018 0947   Lab Results  Component Value Date   HGBA1C 5.2 11/03/2018   HGBA1C 5.2 07/11/2018   HGBA1C 5.3 09/27/2017   Lab Results  Component Value Date   INSULIN 10.6 11/03/2018   INSULIN 21.0  07/11/2018   CBC    Component Value Date/Time   WBC 6.2 07/11/2018 1404   WBC 9.9 09/27/2017 1549   RBC 4.56 07/11/2018 1404   RBC 4.90 09/27/2017 1549   HGB 14.3 07/11/2018 1404   HGB 15.9 02/26/2016 1522   HCT 42.8 07/11/2018 1404   HCT 45.5 02/26/2016 1522   PLT 253.0 09/27/2017 1549   PLT 236 02/26/2016 1522   MCV 94 07/11/2018 1404   MCV 93 02/26/2016 1522   MCH 31.4 07/11/2018 1404   MCH 32.6 02/26/2016 1522   MCH 32.4 01/28/2016 1035   MCHC 33.4 07/11/2018 1404   MCHC 34.3 09/27/2017 1549   RDW 13.7 07/11/2018 1404   RDW 13.6 02/26/2016 1522   LYMPHSABS 1.7 07/11/2018 1404   LYMPHSABS 1.7 02/26/2016 1522   EOSABS 0.1 07/11/2018 1404   EOSABS 0.2 02/26/2016 1522  BASOSABS 0.1 07/11/2018 1404   BASOSABS 0.0 02/26/2016 1522   Iron/TIBC/Ferritin/ %Sat    Component Value Date/Time   IRON 65 02/26/2016 1521   TIBC 280 02/26/2016 1521   FERRITIN 90 02/26/2016 1521   IRONPCTSAT 23 02/26/2016 1521   Lipid Panel     Component Value Date/Time   CHOL 197 11/03/2018 0947   TRIG 70 11/03/2018 0947   HDL 48 11/03/2018 0947   CHOLHDL 5 09/27/2017 1549   VLDL 36.4 09/27/2017 1549   LDLCALC 135 (H) 11/03/2018 0947   Hepatic Function Panel     Component Value Date/Time   PROT 6.8 11/03/2018 0947   ALBUMIN 4.2 11/03/2018 0947   ALBUMIN 3.9 02/26/2016 1522   AST 21 11/03/2018 0947   AST 19 02/26/2016 1522   ALT 37 (H) 11/03/2018 0947   ALT 21 02/26/2016 1522   ALKPHOS 68 11/03/2018 0947   ALKPHOS 75 02/26/2016 1522   BILITOT 0.3 11/03/2018 0947      Component Value Date/Time   TSH 2.260 07/11/2018 1404   TSH 1.25 09/27/2017 1549   TSH 0.94 11/20/2015 1012   Results for Beth Rivers, Beth Rivers (MRN [id number]) as of 01/09/2019 07:27  Ref. Range 11/03/2018 09:47  Vitamin D, 25-Hydroxy Latest Ref Range: 30.0 - 100.0 ng/mL 52.8   Beth Rivers, Beth Rivers, am acting as Location manager for [employer], FNP-C.  Beth Rivers have reviewed the above documentation for accuracy and completeness,  and Beth Rivers agree with the above.  - Libbey Duce, FNP-C.

## 2019-01-09 NOTE — Progress Notes (Signed)
Virtual Visit via Video Note  I connected with Anyae Griffith on 01/09/19 at  2:00 PM EDT by a video enabled telemedicine application and verified that I am speaking with the correct person using two identifiers.   I discussed the limitations of evaluation and management by telemedicine and the availability of in person appointments. The patient expressed understanding and agreed to proceed.  History of Present Illness: The patient is a 63 y.o. female with visit for left hand tingling. Started 2-3 weeks ago. Since covid-19 has been doing a lot more typing at work. This is slightly more than usual but not that unusual. She denies cough or SOB or fevers or chills. She denies swelling in her hands. She denies weakness or color change or cold hands. Has not tried anything for it. Denies worsening since onset. Overall it is stable. Has tried nothing.  Observations/Objective: Appearance: normal, breathing appears normal, work grooming, abdomen does not appear distended, throat normal, mental status is A and O times 3  Assessment and Plan: See problem oriented charting  Follow Up Instructions: aleve regular for 1 week or try carpal tunnel brace  I discussed the assessment and treatment plan with the patient. The patient was provided an opportunity to ask questions and all were answered. The patient agreed with the plan and demonstrated an understanding of the instructions.   The patient was advised to call back or seek an in-person evaluation if the symptoms worsen or if the condition fails to improve as anticipated.  Hoyt Koch, MD

## 2019-01-09 NOTE — Assessment & Plan Note (Signed)
Will try aleve regular for 1-2 weeks and if no improvement can try carpal tunnel brace. If no improvement can come in for labs for B12 and thyroid.

## 2019-01-19 ENCOUNTER — Other Ambulatory Visit: Payer: Self-pay

## 2019-01-19 ENCOUNTER — Ambulatory Visit (INDEPENDENT_AMBULATORY_CARE_PROVIDER_SITE_OTHER): Payer: BLUE CROSS/BLUE SHIELD | Admitting: Family Medicine

## 2019-01-19 ENCOUNTER — Encounter (INDEPENDENT_AMBULATORY_CARE_PROVIDER_SITE_OTHER): Payer: Self-pay | Admitting: Family Medicine

## 2019-01-19 DIAGNOSIS — Z6835 Body mass index (BMI) 35.0-35.9, adult: Secondary | ICD-10-CM

## 2019-01-19 DIAGNOSIS — E559 Vitamin D deficiency, unspecified: Secondary | ICD-10-CM

## 2019-01-19 DIAGNOSIS — R200 Anesthesia of skin: Secondary | ICD-10-CM | POA: Diagnosis not present

## 2019-01-19 NOTE — Progress Notes (Signed)
Office: [phone]  /  Fax: [phone] TeleHealth Visit:  Beth Rivers has verbally consented to this TeleHealth visit today. The patient is located at work, the provider is located at the [facility] office. The participants in this visit include the listed provider and patient. The visit was conducted today via Webex.  HPI:   Chief Complaint: OBESITY Beth Rivers is here to discuss her progress with her obesity treatment plan. She is on the Category 3 plan. She states she is walking 30 minutes 7 times per week. Beth Rivers states she was off the plan on her birthday but is now back on the plan. Otherwise, she is sticking to the plan very well. She has maintained her weight and feels lack of weight loss is due to her recent birthday celebration. 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 We were unable to weigh the patient today for this TeleHealth visit. She feels as if she has maintained her weight since her last visit. She has lost 21 lbs since starting treatment with [place].  Vitamin D deficiency Beth Rivers has a diagnosis of Vitamin D deficiency, which is at goal. She is currently taking prescription Vit D and denies nausea, vomiting or muscle weakness.  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 There is no pain in her arm or chest. She states she is taking NSAIDs as recommended by her PCP. PCP said it was likely carpal tunnel.  ASSESSMENT AND PLAN:  Vitamin D deficiency  Left arm numbness  Class 2 severe obesity with serious comorbidity and body mass index (BMI) of 35.0 to 35.9 in adult, unspecified obesity type ([place])  PLAN:  Vitamin D Deficiency Beth Rivers was informed that low Vitamin D levels contributes to fatigue and are associated with obesity, breast, and colon cancer. She agrees to continue to take prescription Vit D and will follow-up for routine testing of Vitamin D in 4-6 weeks. She was informed of the risk  of over-replacement of Vitamin D and agrees to not increase her dose unless she discusses this with [place] first. Beth Rivers agrees to follow-up with our clinic in 2 weeks.  Numbness, Left Arm We discussed proper ergonomics for computer work.  I spent > than 50% of the 15 minute visit on counseling as documented in the note.  Obesity Beth Rivers is currently in the action stage of change. As such, her goal is to continue with weight loss efforts. She has agreed to follow the Category 3 plan. Beth Rivers was encouraged to improve her self care and to get more rest. Beth Rivers has been instructed to continue her current exercise regimen for weight loss and overall health benefits. We discussed the following Behavioral Modification Strategies today: planning for success.   Beth Rivers has agreed to follow-up with our clinic in 2 weeks. She was informed of the importance of frequent follow-up visits to maximize her success with intensive lifestyle modifications for her multiple health conditions.  ALLERGIES: No Known Allergies  MEDICATIONS: Current Outpatient Medications on File Prior to Visit  Medication Sig Dispense Refill  . triamterene-hydrochlorothiazide (MAXZIDE-25) 37.5-25 MG tablet Take 1 tablet by mouth daily. 30 tablet 2  . Vitamin D, Ergocalciferol, (DRISDOL) 1.25 MG (50000 UT) CAPS capsule Take 1 capsule (50,000 Units total) by mouth every 7 (seven) days. 4 capsule 0   No current facility-administered medications on file prior to visit.     PAST MEDICAL HISTORY: Past Medical History:  Diagnosis Date  . Anxiety   . Depression 09/2001  . Elevated BP   . Elevated LDL cholesterol level 1/14  . Feeling lonely   . History of uterine fibroid 11/2002  . HTN (hypertension)   . Joint pain   . Leg edema   . Obesity   . Obstructive sleep apnea     PAST SURGICAL HISTORY: History reviewed. No pertinent surgical history.  SOCIAL HISTORY: Social History   Tobacco Use  . Smoking status: Never Smoker  .  Smokeless tobacco: Never Used  Substance Use Topics  . Alcohol use: Yes    Alcohol/week: 1.0 standard drinks    Types: 1 Glasses of wine per week  . Drug use: No    FAMILY HISTORY: Family History  Problem Relation Age of Onset  . Hypertension Mother   . Hyperlipidemia Mother   . Depression Mother   . Obesity Mother   . Hypertension Father   . Heart attack Father   . Obesity Father   . Hyperlipidemia Father   . Heart disease Father   . Sudden death Daughter   . Colon cancer Maternal Grandmother    ROS: Review of Systems  Gastrointestinal: Negative for nausea and vomiting.  Musculoskeletal:       Negative for muscle weakness.  Neurological:       Positive for numbness in left arm.   PHYSICAL EXAM: Pt in no acute distress  RECENT LABS AND TESTS: BMET    Component Value Date/Time   NA 144 11/03/2018 0947   K 4.2 11/03/2018 0947   K 3.9 02/26/2016 1522   CL 105 11/03/2018 0947   CL 103 02/26/2016 1522   CO2 24 11/03/2018 0947   CO2 26 02/26/2016 1522   GLUCOSE 96 11/03/2018 0947   GLUCOSE 99 09/27/2017 1549   BUN 28 (H) 11/03/2018 0947   CREATININE 0.64 11/03/2018 0947   CREATININE 0.64 02/26/2016 1522   CREATININE 0.84 01/28/2016 1035   CALCIUM 9.2 11/03/2018 0947   CALCIUM 8.8 02/26/2016 1522   GFRNONAA 97 11/03/2018 0947   GFRAA 111 11/03/2018 0947   Lab Results  Component Value Date   HGBA1C 5.2 11/03/2018   HGBA1C 5.2 07/11/2018   HGBA1C 5.3 09/27/2017   Lab Results  Component Value Date   INSULIN 10.6 11/03/2018   INSULIN 21.0 07/11/2018   CBC    Component Value Date/Time   WBC 6.2 07/11/2018 1404   WBC 9.9 09/27/2017 1549   RBC 4.56 07/11/2018 1404   RBC 4.90 09/27/2017 1549   HGB 14.3 07/11/2018 1404   HGB 15.9 02/26/2016 1522   HCT 42.8 07/11/2018 1404   HCT 45.5 02/26/2016 1522   PLT 253.0 09/27/2017 1549   PLT 236 02/26/2016 1522   MCV 94 07/11/2018 1404   MCV 93 02/26/2016 1522   MCH 31.4 07/11/2018 1404   MCH 32.6 02/26/2016  1522   MCH 32.4 01/28/2016 1035   MCHC 33.4 07/11/2018 1404   MCHC 34.3 09/27/2017 1549   RDW 13.7 07/11/2018 1404   RDW 13.6 02/26/2016 1522   LYMPHSABS 1.7 07/11/2018 1404   LYMPHSABS 1.7 02/26/2016 1522   EOSABS 0.1 07/11/2018 1404   EOSABS 0.2 02/26/2016 1522   BASOSABS 0.1 07/11/2018 1404   BASOSABS 0.0 02/26/2016 1522   Iron/TIBC/Ferritin/ %Sat    Component Value Date/Time   IRON 65 02/26/2016 1521   TIBC 280 02/26/2016 1521   FERRITIN 90 02/26/2016 1521   IRONPCTSAT 23 02/26/2016 1521   Lipid Panel  Component Value Date/Time   CHOL 197 11/03/2018 0947   TRIG 70 11/03/2018 0947   HDL 48 11/03/2018 0947   CHOLHDL 5 09/27/2017 1549   VLDL 36.4 09/27/2017 1549   LDLCALC 135 (H) 11/03/2018 0947   Hepatic Function Panel     Component Value Date/Time   PROT 6.8 11/03/2018 0947   ALBUMIN 4.2 11/03/2018 0947   ALBUMIN 3.9 02/26/2016 1522   AST 21 11/03/2018 0947   AST 19 02/26/2016 1522   ALT 37 (H) 11/03/2018 0947   ALT 21 02/26/2016 1522   ALKPHOS 68 11/03/2018 0947   ALKPHOS 75 02/26/2016 1522   BILITOT 0.3 11/03/2018 0947      Component Value Date/Time   TSH 2.260 07/11/2018 1404   TSH 1.25 09/27/2017 1549   TSH 0.94 11/20/2015 1012   Results for JENITA, RAYFIELD (MRN [id number]) as of 01/19/2019 15:45  Ref. Range 11/03/2018 09:47  Vitamin D, 25-Hydroxy Latest Ref Range: 30.0 - 100.0 ng/mL 52.8   I, Michaelene Song, am acting as Location manager for [employer], FNP-C.  I have reviewed the above documentation for accuracy and completeness, and I agree with the above.  - Cesily Cuoco, FNP-C.

## 2019-02-02 ENCOUNTER — Ambulatory Visit (INDEPENDENT_AMBULATORY_CARE_PROVIDER_SITE_OTHER): Payer: BLUE CROSS/BLUE SHIELD | Admitting: Family Medicine

## 2019-02-02 ENCOUNTER — Other Ambulatory Visit: Payer: Self-pay

## 2019-02-02 DIAGNOSIS — R200 Anesthesia of skin: Secondary | ICD-10-CM

## 2019-02-02 DIAGNOSIS — E559 Vitamin D deficiency, unspecified: Secondary | ICD-10-CM

## 2019-02-02 DIAGNOSIS — Z6835 Body mass index (BMI) 35.0-35.9, adult: Secondary | ICD-10-CM

## 2019-02-02 MED ORDER — VITAMIN D (ERGOCALCIFEROL) 1.25 MG (50000 UNIT) PO CAPS: 50000.0000 [IU] | ORAL_CAPSULE | ORAL | 0 refills | Status: DC

## 2019-02-06 ENCOUNTER — Encounter (INDEPENDENT_AMBULATORY_CARE_PROVIDER_SITE_OTHER): Payer: Self-pay | Admitting: Family Medicine

## 2019-02-06 NOTE — Progress Notes (Signed)
Office: [phone]  /  Fax: [phone] TeleHealth Visit:  Beth Rivers has verbally consented to this TeleHealth visit today. The patient is located at work, the provider is located at the [employer] [facility] office. The participants in this visit include the listed provider and patient. The visit was conducted today via Webex.  HPI:   Chief Complaint: OBESITY Beth Rivers is here to discuss her progress with her obesity treatment plan. She is on the Category 3 plan and is following her eating plan approximately 100% of the time. She states she is walking 30 minutes 3 times per week. Beth Rivers states she is sticking to the plan 100% and is happy with Category 3. She wants to lose 15 lbs by the middle of August.  We were unable to weigh the patient today for this TeleHealth visit. She feels as if she has lost 2 lbs since her last visit. She has lost 21 lbs since starting treatment with [place].  Vitamin D deficiency Beth Rivers has a diagnosis of Vitamin D deficiency. Her level is at goal and was reported to be 52.8 on 11/03/2018. She is currently taking prescription Vit D and denies nausea, vomiting or muscle weakness.  Left Hand Numbness Beth Rivers continues to have numbness in her left hand and forearm. She states she is working on the computer excessively but trying to use good ergonomics. She has also been taking ibuprofen to reduce inflammation.   ASSESSMENT AND PLAN:  Vitamin D deficiency - Plan: Vitamin D, Ergocalciferol, (DRISDOL) 1.25 MG (50000 UT) CAPS capsule  Numbness of left hand  Class 2 severe obesity with serious comorbidity and body mass index (BMI) of 35.0 to 35.9 in adult, unspecified obesity type ([place])  PLAN:  Vitamin D Deficiency Beth Rivers was informed that low Vitamin D levels contributes to fatigue and are associated with obesity, breast, and colon cancer. She agrees to continue to take prescription Vit D @ 50,000 IU every week #4 with 0 refills and will follow-up for routine  testing of Vitamin D in 4-6 weeks. She was informed of the risk of over-replacement of Vitamin D and agrees to not increase her dose unless she discusses this with [place] first. Beth Rivers agrees to follow-up with our clinic in 2 weeks.  Left Hand Numbness Beth Rivers was advised to wear a wrist brace at night and to take Ibuprofen per package directions.  Obesity Beth Rivers is currently in the action stage of change. As such, her goal is to continue with weight loss efforts. She has agreed to follow the Category 3 plan. She may reduce extra calories to 200 per day. Beth Rivers has been instructed to continue her current exercise regimen and to increase her regimen to 5 days per week for weight loss and overall health benefits. We discussed the following Behavioral Modification Strategies today: planning for success.   Beth Rivers has agreed to follow-up with our clinic in 2 weeks. She was informed of the importance of frequent follow-up visits to maximize her success with intensive lifestyle modifications for her multiple health conditions.  ALLERGIES: No Known Allergies  MEDICATIONS: Current Outpatient Medications on File Prior to Visit  Medication Sig Dispense Refill  . triamterene-hydrochlorothiazide (MAXZIDE-25) 37.5-25 MG tablet Take 1 tablet by mouth daily. 30 tablet 2   No current facility-administered medications on file prior to visit.     PAST MEDICAL HISTORY: Past Medical History:  Diagnosis Date  . Anxiety   . Depression 09/2001  . Elevated BP   . Elevated LDL cholesterol level 1/14  .  Feeling lonely   . History of uterine fibroid 11/2002  . HTN (hypertension)   . Joint pain   . Leg edema   . Obesity   . Obstructive sleep apnea     PAST SURGICAL HISTORY: No past surgical history on file.  SOCIAL HISTORY: Social History   Tobacco Use  . Smoking status: Never Smoker  . Smokeless tobacco: Never Used  Substance Use Topics  . Alcohol use: Yes    Alcohol/week: 1.0 standard drinks    Types:  1 Glasses of wine per week  . Drug use: No    FAMILY HISTORY: Family History  Problem Relation Age of Onset  . Hypertension Mother   . Hyperlipidemia Mother   . Depression Mother   . Obesity Mother   . Hypertension Father   . Heart attack Father   . Obesity Father   . Hyperlipidemia Father   . Heart disease Father   . Sudden death Daughter   . Colon cancer Maternal Grandmother    ROS: Review of Systems  Gastrointestinal: Negative for nausea and vomiting.  Musculoskeletal:       Negative for muscle weakness.  Neurological:       Positive for left hand numbness.   PHYSICAL EXAM: Pt in no acute distress  RECENT LABS AND TESTS: BMET    Component Value Date/Time   NA 144 11/03/2018 0947   K 4.2 11/03/2018 0947   K 3.9 02/26/2016 1522   CL 105 11/03/2018 0947   CL 103 02/26/2016 1522   CO2 24 11/03/2018 0947   CO2 26 02/26/2016 1522   GLUCOSE 96 11/03/2018 0947   GLUCOSE 99 09/27/2017 1549   BUN 28 (H) 11/03/2018 0947   CREATININE 0.64 11/03/2018 0947   CREATININE 0.64 02/26/2016 1522   CREATININE 0.84 01/28/2016 1035   CALCIUM 9.2 11/03/2018 0947   CALCIUM 8.8 02/26/2016 1522   GFRNONAA 97 11/03/2018 0947   GFRAA 111 11/03/2018 0947   Lab Results  Component Value Date   HGBA1C 5.2 11/03/2018   HGBA1C 5.2 07/11/2018   HGBA1C 5.3 09/27/2017   Lab Results  Component Value Date   INSULIN 10.6 11/03/2018   INSULIN 21.0 07/11/2018   CBC    Component Value Date/Time   WBC 6.2 07/11/2018 1404   WBC 9.9 09/27/2017 1549   RBC 4.56 07/11/2018 1404   RBC 4.90 09/27/2017 1549   HGB 14.3 07/11/2018 1404   HGB 15.9 02/26/2016 1522   HCT 42.8 07/11/2018 1404   HCT 45.5 02/26/2016 1522   PLT 253.0 09/27/2017 1549   PLT 236 02/26/2016 1522   MCV 94 07/11/2018 1404   MCV 93 02/26/2016 1522   MCH 31.4 07/11/2018 1404   MCH 32.6 02/26/2016 1522   MCH 32.4 01/28/2016 1035   MCHC 33.4 07/11/2018 1404   MCHC 34.3 09/27/2017 1549   RDW 13.7 07/11/2018 1404   RDW  13.6 02/26/2016 1522   LYMPHSABS 1.7 07/11/2018 1404   LYMPHSABS 1.7 02/26/2016 1522   EOSABS 0.1 07/11/2018 1404   EOSABS 0.2 02/26/2016 1522   BASOSABS 0.1 07/11/2018 1404   BASOSABS 0.0 02/26/2016 1522   Iron/TIBC/Ferritin/ %Sat    Component Value Date/Time   IRON 65 02/26/2016 1521   TIBC 280 02/26/2016 1521   FERRITIN 90 02/26/2016 1521   IRONPCTSAT 23 02/26/2016 1521   Lipid Panel     Component Value Date/Time   CHOL 197 11/03/2018 0947   TRIG 70 11/03/2018 0947   HDL 48 11/03/2018 0947  CHOLHDL 5 09/27/2017 1549   VLDL 36.4 09/27/2017 1549   LDLCALC 135 (H) 11/03/2018 0947   Hepatic Function Panel     Component Value Date/Time   PROT 6.8 11/03/2018 0947   ALBUMIN 4.2 11/03/2018 0947   ALBUMIN 3.9 02/26/2016 1522   AST 21 11/03/2018 0947   AST 19 02/26/2016 1522   ALT 37 (H) 11/03/2018 0947   ALT 21 02/26/2016 1522   ALKPHOS 68 11/03/2018 0947   ALKPHOS 75 02/26/2016 1522   BILITOT 0.3 11/03/2018 0947      Component Value Date/Time   TSH 2.260 07/11/2018 1404   TSH 1.25 09/27/2017 1549   TSH 0.94 11/20/2015 1012   Results for BRYNLYNN, Beth Rivers (MRN [id number]) as of 02/06/2019 09:18  Ref. Range 11/03/2018 09:47  Vitamin D, 25-Hydroxy Latest Ref Range: 30.0 - 100.0 ng/mL 52.8    I, Michaelene Song, am acting as Location manager for [employer], FNP-C.  I have reviewed the above documentation for accuracy and completeness, and I agree with the above.  -  , FNP-C.

## 2019-02-16 ENCOUNTER — Ambulatory Visit (INDEPENDENT_AMBULATORY_CARE_PROVIDER_SITE_OTHER): Payer: BLUE CROSS/BLUE SHIELD | Admitting: Family Medicine

## 2019-02-16 ENCOUNTER — Other Ambulatory Visit: Payer: Self-pay

## 2019-02-16 ENCOUNTER — Encounter (INDEPENDENT_AMBULATORY_CARE_PROVIDER_SITE_OTHER): Payer: Self-pay | Admitting: Family Medicine

## 2019-02-16 DIAGNOSIS — E559 Vitamin D deficiency, unspecified: Secondary | ICD-10-CM

## 2019-02-16 DIAGNOSIS — E8881 Metabolic syndrome: Secondary | ICD-10-CM | POA: Diagnosis not present

## 2019-02-16 DIAGNOSIS — Z6835 Body mass index (BMI) 35.0-35.9, adult: Secondary | ICD-10-CM

## 2019-02-16 DIAGNOSIS — E88819 Insulin resistance, unspecified: Secondary | ICD-10-CM | POA: Insufficient documentation

## 2019-02-16 MED ORDER — NALTREXONE-BUPROPION HCL ER 8-90 MG PO TB12: ORAL_TABLET | ORAL | 0 refills | Status: DC

## 2019-02-16 NOTE — Progress Notes (Signed)
Office: [phone]  /  Fax: [phone] TeleHealth Visit:  Beth Rivers has verbally consented to this TeleHealth visit today. The patient is located at work, the provider is located at the [employer] [facility] office. The participants in this visit include the listed provider and patient. The visit was conducted today via Webex.  HPI:   Chief Complaint: OBESITY Beth Rivers is here to discuss her progress with her obesity treatment plan. She is on the Category 3 plan and is following her eating plan approximately 80 % of the time. She states she is walking 5 miles 7 times per week. Beth Rivers was off the plan over the [facility] Day holiday. She is back on her plan. She wants to eat watermelon and peaches. Beth Rivers is interested in an appetite suppressant. She has been on phentermine in the past with some success..  We were unable to weigh the patient today for this TeleHealth visit. She feels as if she has maintained weight since her last visit. She has lost 21 lbs since starting treatment with [place].  Insulin Resistance Beth Rivers has a diagnosis of insulin resistance based on her elevated fasting insulin level >5. Although Beth Rivers's blood glucose readings are still under good control, insulin resistance puts her at greater risk of metabolic syndrome and diabetes. Beth Rivers notes polyphagia last week, but is now much better. She is not taking metformin currently and continues to work on diet and exercise to decrease risk of diabetes.  Vitamin D Deficiency Beth Rivers has a diagnosis of vitamin D deficiency. She is currently on prescription vit D and is at goal. Beth Rivers denies nausea, vomiting, or muscle weakness.  ASSESSMENT AND PLAN:  Insulin resistance  Vitamin D deficiency  Class 2 severe obesity with serious comorbidity and body mass index (BMI) of 35.0 to 35.9 in adult, unspecified obesity type ([place]) - Plan: Naltrexone-buPROPion HCl ER (CONTRAVE) 8-90 MG TB12  PLAN:  Insulin Resistance Beth Rivers will continue  to work on weight loss, exercise, and decreasing simple carbohydrates in her diet to help decrease the risk of diabetes. Beth Rivers agreed to continue her meal plan and agreed to follow up with [place] as directed to monitor her progress in 2 weeks.  Vitamin D Deficiency Beth Rivers was informed that low vitamin D levels contribute to fatigue and are associated with obesity, breast, and colon cancer. Beth Rivers agrees to continue to take prescription Vit D @50 ,000 IU and will follow up for routine testing of vitamin D, at least 2-3 times per year. She was informed of the risk of over-replacement of vitamin D and agrees to not increase her dose unless she discusses this with [place] first. We will check her vitamin D level at her next in person office visit. Beth Rivers agrees to follow up in 2 weeks as directed.  Obesity Beth Rivers is currently in the action stage of change. As such, her goal is to continue with weight loss efforts. She has agreed to follow the Category 3 plan. Beth Rivers was sent the [employer]. Beth Rivers has been instructed to continue her current exercise regimen. We discussed the following Behavioral Modification Strategies today: better snacking choices and planning for success. We discussed various medication options to help Beth Rivers with her weight loss efforts and we both agreed to Contrave 8-90 mg, 2 pills BID #120 with no refills. Beth Rivers is to take 1 pill every morning. Patient has no history of seizures or glaucoma. Patient was informed of side effects.   Beth Rivers has agreed to follow up with our clinic in  2 weeks. She was informed of the importance of frequent follow up visits to maximize her success with intensive lifestyle modifications for her multiple health conditions.  ALLERGIES: No Known Allergies  MEDICATIONS: Current Outpatient Medications on File Prior to Visit  Medication Sig Dispense Refill  . triamterene-hydrochlorothiazide (MAXZIDE-25) 37.5-25 MG tablet Take 1 tablet by mouth daily. 30 tablet 2   . Vitamin D, Ergocalciferol, (DRISDOL) 1.25 MG (50000 UT) CAPS capsule Take 1 capsule (50,000 Units total) by mouth every 7 (seven) days. 4 capsule 0   No current facility-administered medications on file prior to visit.     PAST MEDICAL HISTORY: Past Medical History:  Diagnosis Date  . Anxiety   . Depression 09/2001  . Elevated BP   . Elevated LDL cholesterol level 1/14  . Feeling lonely   . History of uterine fibroid 11/2002  . HTN (hypertension)   . Joint pain   . Leg edema   . Obesity   . Obstructive sleep apnea     PAST SURGICAL HISTORY: History reviewed. No pertinent surgical history.  SOCIAL HISTORY: Social History   Tobacco Use  . Smoking status: Never Smoker  . Smokeless tobacco: Never Used  Substance Use Topics  . Alcohol use: Yes    Alcohol/week: 1.0 standard drinks    Types: 1 Glasses of wine per week  . Drug use: No    FAMILY HISTORY: Family History  Problem Relation Age of Onset  . Hypertension Mother   . Hyperlipidemia Mother   . Depression Mother   . Obesity Mother   . Hypertension Father   . Heart attack Father   . Obesity Father   . Hyperlipidemia Father   . Heart disease Father   . Sudden death Daughter   . Colon cancer Maternal Grandmother     ROS: Review of Systems  Gastrointestinal: Negative for nausea and vomiting.  Musculoskeletal:       Negative for muscle weakness.  Endo/Heme/Allergies:       Positive for polyphagia.    PHYSICAL EXAM: Pt in no acute distress  RECENT LABS AND TESTS: BMET    Component Value Date/Time   NA 144 11/03/2018 0947   K 4.2 11/03/2018 0947   K 3.9 02/26/2016 1522   CL 105 11/03/2018 0947   CL 103 02/26/2016 1522   CO2 24 11/03/2018 0947   CO2 26 02/26/2016 1522   GLUCOSE 96 11/03/2018 0947   GLUCOSE 99 09/27/2017 1549   BUN 28 (H) 11/03/2018 0947   CREATININE 0.64 11/03/2018 0947   CREATININE 0.64 02/26/2016 1522   CREATININE 0.84 01/28/2016 1035   CALCIUM 9.2 11/03/2018 0947   CALCIUM  8.8 02/26/2016 1522   GFRNONAA 97 11/03/2018 0947   GFRAA 111 11/03/2018 0947   Lab Results  Component Value Date   HGBA1C 5.2 11/03/2018   HGBA1C 5.2 07/11/2018   HGBA1C 5.3 09/27/2017   Lab Results  Component Value Date   INSULIN 10.6 11/03/2018   INSULIN 21.0 07/11/2018   CBC    Component Value Date/Time   WBC 6.2 07/11/2018 1404   WBC 9.9 09/27/2017 1549   RBC 4.56 07/11/2018 1404   RBC 4.90 09/27/2017 1549   HGB 14.3 07/11/2018 1404   HGB 15.9 02/26/2016 1522   HCT 42.8 07/11/2018 1404   HCT 45.5 02/26/2016 1522   PLT 253.0 09/27/2017 1549   PLT 236 02/26/2016 1522   MCV 94 07/11/2018 1404   MCV 93 02/26/2016 1522   MCH 31.4 07/11/2018 1404  MCH 32.6 02/26/2016 1522   MCH 32.4 01/28/2016 1035   MCHC 33.4 07/11/2018 1404   MCHC 34.3 09/27/2017 1549   RDW 13.7 07/11/2018 1404   RDW 13.6 02/26/2016 1522   LYMPHSABS 1.7 07/11/2018 1404   LYMPHSABS 1.7 02/26/2016 1522   EOSABS 0.1 07/11/2018 1404   EOSABS 0.2 02/26/2016 1522   BASOSABS 0.1 07/11/2018 1404   BASOSABS 0.0 02/26/2016 1522   Iron/TIBC/Ferritin/ %Sat    Component Value Date/Time   IRON 65 02/26/2016 1521   TIBC 280 02/26/2016 1521   FERRITIN 90 02/26/2016 1521   IRONPCTSAT 23 02/26/2016 1521   Lipid Panel     Component Value Date/Time   CHOL 197 11/03/2018 0947   TRIG 70 11/03/2018 0947   HDL 48 11/03/2018 0947   CHOLHDL 5 09/27/2017 1549   VLDL 36.4 09/27/2017 1549   LDLCALC 135 (H) 11/03/2018 0947   Hepatic Function Panel     Component Value Date/Time   PROT 6.8 11/03/2018 0947   ALBUMIN 4.2 11/03/2018 0947   ALBUMIN 3.9 02/26/2016 1522   AST 21 11/03/2018 0947   AST 19 02/26/2016 1522   ALT 37 (H) 11/03/2018 0947   ALT 21 02/26/2016 1522   ALKPHOS 68 11/03/2018 0947   ALKPHOS 75 02/26/2016 1522   BILITOT 0.3 11/03/2018 0947      Component Value Date/Time   TSH 2.260 07/11/2018 1404   TSH 1.25 09/27/2017 1549   TSH 0.94 11/20/2015 1012   Results for NATASIA, SANKO (MRN  [id number]) as of 02/16/2019 16:27  Ref. Range 11/03/2018 09:47  Vitamin D, 25-Hydroxy Latest Ref Range: 30.0 - 100.0 ng/mL 52.8     I, Marcille Blanco, CMA, am acting as Location manager for [employer], FNP-C.  I have reviewed the above documentation for accuracy and completeness, and I agree with the above.  - Dawn Whitmire, FNP-C.

## 2019-02-20 ENCOUNTER — Encounter (INDEPENDENT_AMBULATORY_CARE_PROVIDER_SITE_OTHER): Payer: Self-pay | Admitting: Family Medicine

## 2019-02-21 ENCOUNTER — Other Ambulatory Visit (INDEPENDENT_AMBULATORY_CARE_PROVIDER_SITE_OTHER): Payer: Self-pay

## 2019-02-21 MED ORDER — NALTREXONE-BUPROPION HCL ER 8-90 MG PO TB12: ORAL_TABLET | ORAL | 0 refills | Status: DC

## 2019-02-26 ENCOUNTER — Encounter (INDEPENDENT_AMBULATORY_CARE_PROVIDER_SITE_OTHER): Payer: Self-pay | Admitting: Family Medicine

## 2019-02-28 ENCOUNTER — Ambulatory Visit (INDEPENDENT_AMBULATORY_CARE_PROVIDER_SITE_OTHER): Payer: BLUE CROSS/BLUE SHIELD | Admitting: Family Medicine

## 2019-03-02 ENCOUNTER — Encounter (INDEPENDENT_AMBULATORY_CARE_PROVIDER_SITE_OTHER): Payer: Self-pay | Admitting: Family Medicine

## 2019-03-02 ENCOUNTER — Ambulatory Visit (INDEPENDENT_AMBULATORY_CARE_PROVIDER_SITE_OTHER): Payer: BC Managed Care – PPO | Admitting: Family Medicine

## 2019-03-02 ENCOUNTER — Other Ambulatory Visit: Payer: Self-pay

## 2019-03-02 DIAGNOSIS — E8881 Metabolic syndrome: Secondary | ICD-10-CM

## 2019-03-02 DIAGNOSIS — E559 Vitamin D deficiency, unspecified: Secondary | ICD-10-CM | POA: Diagnosis not present

## 2019-03-02 DIAGNOSIS — Z6835 Body mass index (BMI) 35.0-35.9, adult: Secondary | ICD-10-CM | POA: Diagnosis not present

## 2019-03-06 ENCOUNTER — Encounter (INDEPENDENT_AMBULATORY_CARE_PROVIDER_SITE_OTHER): Payer: Self-pay | Admitting: Family Medicine

## 2019-03-06 NOTE — Progress Notes (Signed)
Office: [phone]  /  Fax: [phone] TeleHealth Visit:  Addisson Frate has verbally consented to this TeleHealth visit today. The patient is located at work, the provider is located at the [employer] [facility] office. The participants in this visit include the listed provider and patient. The visit was conducted today via Webex.  HPI:   Chief Complaint: OBESITY Icyss is here to discuss her progress with her obesity treatment plan. She is on the Category 3 plan and is following her eating plan approximately 90-100% of the time. She states she is walking 3 miles daily.  Zaria states her weight is maintained at 198 lbs today. She started Contrave this week and reports having had some dizziness and queasiness. She is taking 1 pill in the a.m. and it is causing appetite suppression. She reports struggling with eating at night. She may want to drink alcohol occasionally and sive alcohol is contraindicated with Contrave she would like to know about other anti-obesity drug options. We were unable to weigh the patient today for this TeleHealth visit. She feels as if she has maintained her weight since her last visit. She has lost 21 lbs since starting treatment with [place].  Insulin Resistance Nakiesha has a diagnosis of insulin resistance based on her elevated fasting insulin level >5. Although Bessye's blood glucose readings are still under good control, insulin resistance puts her at greater risk of metabolic syndrome and diabetes. She is not taking metformin currently and continues to work on diet and exercise to decrease risk of diabetes. No polyphagia. Lab Results  Component Value Date   HGBA1C 5.2 11/03/2018    Vitamin D deficiency Yizel has a diagnosis of Vitamin D deficiency, which is now at goal. She is currently taking prescription Vit D and denies nausea or vomiting.  ASSESSMENT AND PLAN:  Insulin resistance  Vitamin D deficiency  Class 2 severe obesity with serious comorbidity  and body mass index (BMI) of 35.0 to 35.9 in adult, unspecified obesity type ([place])  PLAN:  Insulin Resistance Lenore will continue to work on weight loss, exercise, and decreasing simple carbohydrates in her diet to help decrease the risk of diabetes. We dicussed metformin including benefits and risks. She was informed that eating too many simple carbohydrates or too many calories at one sitting increases the likelihood of GI side effects. Zakaria will continue her meal plan and follow-up with [place] as directed to monitor her progress.  Vitamin D Deficiency Ameri was informed that low Vitamin D levels contributes to fatigue and are associated with obesity, breast, and colon cancer. She agrees to discontinue prescription Vit D and change to OTC Vitamin D3 and will follow-up for routine testing of Vitamin D at her next visit. She was informed of the risk of over-replacement of Vitamin D and agrees to not increase her dose unless she discusses this with [place] first. Shawan agrees to follow-up with our clinic in 2 weeks.  I spent > than 50% of the 15 minute visit on counseling as documented in the note.  Obesity Darriana is currently in the action stage of change. As such, her goal is to continue with weight loss efforts. She has agreed to follow the Category 3 plan. Corinthian has been instructed to continue her current exercise regimen for weight loss and overall health benefits. We discussed the following Behavioral Modification Strategies today: increase H20 intake and planning for success. We discussed Saxenda and she will see if she has insurance coverage for this. She will continue  1 pill of Contrave in the a.m. and will measure her portions.  Laynie has agreed to follow-up with our clinic in 2 weeks. She was informed of the importance of frequent follow-up visits to maximize her success with intensive lifestyle modifications for her multiple health conditions.  ALLERGIES: No Known Allergies  MEDICATIONS:  Current Outpatient Medications on File Prior to Visit  Medication Sig Dispense Refill  . Naltrexone-buPROPion HCl ER (CONTRAVE) 8-90 MG TB12 Take two tabs by mouth twice daily 120 tablet 0  . triamterene-hydrochlorothiazide (MAXZIDE-25) 37.5-25 MG tablet Take 1 tablet by mouth daily. 30 tablet 2   No current facility-administered medications on file prior to visit.     PAST MEDICAL HISTORY: Past Medical History:  Diagnosis Date  . Anxiety   . Depression 09/2001  . Elevated BP   . Elevated LDL cholesterol level 1/14  . Feeling lonely   . History of uterine fibroid 11/2002  . HTN (hypertension)   . Joint pain   . Leg edema   . Obesity   . Obstructive sleep apnea     PAST SURGICAL HISTORY: History reviewed. No pertinent surgical history.  SOCIAL HISTORY: Social History   Tobacco Use  . Smoking status: Never Smoker  . Smokeless tobacco: Never Used  Substance Use Topics  . Alcohol use: Yes    Alcohol/week: 1.0 standard drinks    Types: 1 Glasses of wine per week  . Drug use: No    FAMILY HISTORY: Family History  Problem Relation Age of Onset  . Hypertension Mother   . Hyperlipidemia Mother   . Depression Mother   . Obesity Mother   . Hypertension Father   . Heart attack Father   . Obesity Father   . Hyperlipidemia Father   . Heart disease Father   . Sudden death Daughter   . Colon cancer Maternal Grandmother    ROS: Review of Systems  Gastrointestinal: Negative for nausea and vomiting.  Endo/Heme/Allergies:       Negative for polyphagia.   PHYSICAL EXAM: Pt in no acute distress  RECENT LABS AND TESTS: BMET    Component Value Date/Time   NA 144 11/03/2018 0947   K 4.2 11/03/2018 0947   K 3.9 02/26/2016 1522   CL 105 11/03/2018 0947   CL 103 02/26/2016 1522   CO2 24 11/03/2018 0947   CO2 26 02/26/2016 1522   GLUCOSE 96 11/03/2018 0947   GLUCOSE 99 09/27/2017 1549   BUN 28 (H) 11/03/2018 0947   CREATININE 0.64 11/03/2018 0947   CREATININE 0.64  02/26/2016 1522   CREATININE 0.84 01/28/2016 1035   CALCIUM 9.2 11/03/2018 0947   CALCIUM 8.8 02/26/2016 1522   GFRNONAA 97 11/03/2018 0947   GFRAA 111 11/03/2018 0947   Lab Results  Component Value Date   HGBA1C 5.2 11/03/2018   HGBA1C 5.2 07/11/2018   HGBA1C 5.3 09/27/2017   Lab Results  Component Value Date   INSULIN 10.6 11/03/2018   INSULIN 21.0 07/11/2018   CBC    Component Value Date/Time   WBC 6.2 07/11/2018 1404   WBC 9.9 09/27/2017 1549   RBC 4.56 07/11/2018 1404   RBC 4.90 09/27/2017 1549   HGB 14.3 07/11/2018 1404   HGB 15.9 02/26/2016 1522   HCT 42.8 07/11/2018 1404   HCT 45.5 02/26/2016 1522   PLT 253.0 09/27/2017 1549   PLT 236 02/26/2016 1522   MCV 94 07/11/2018 1404   MCV 93 02/26/2016 1522   MCH 31.4 07/11/2018 1404  MCH 32.6 02/26/2016 1522   MCH 32.4 01/28/2016 1035   MCHC 33.4 07/11/2018 1404   MCHC 34.3 09/27/2017 1549   RDW 13.7 07/11/2018 1404   RDW 13.6 02/26/2016 1522   LYMPHSABS 1.7 07/11/2018 1404   LYMPHSABS 1.7 02/26/2016 1522   EOSABS 0.1 07/11/2018 1404   EOSABS 0.2 02/26/2016 1522   BASOSABS 0.1 07/11/2018 1404   BASOSABS 0.0 02/26/2016 1522   Iron/TIBC/Ferritin/ %Sat    Component Value Date/Time   IRON 65 02/26/2016 1521   TIBC 280 02/26/2016 1521   FERRITIN 90 02/26/2016 1521   IRONPCTSAT 23 02/26/2016 1521   Lipid Panel     Component Value Date/Time   CHOL 197 11/03/2018 0947   TRIG 70 11/03/2018 0947   HDL 48 11/03/2018 0947   CHOLHDL 5 09/27/2017 1549   VLDL 36.4 09/27/2017 1549   LDLCALC 135 (H) 11/03/2018 0947   Hepatic Function Panel     Component Value Date/Time   PROT 6.8 11/03/2018 0947   ALBUMIN 4.2 11/03/2018 0947   ALBUMIN 3.9 02/26/2016 1522   AST 21 11/03/2018 0947   AST 19 02/26/2016 1522   ALT 37 (H) 11/03/2018 0947   ALT 21 02/26/2016 1522   ALKPHOS 68 11/03/2018 0947   ALKPHOS 75 02/26/2016 1522   BILITOT 0.3 11/03/2018 0947      Component Value Date/Time   TSH 2.260 07/11/2018 1404    TSH 1.25 09/27/2017 1549   TSH 0.94 11/20/2015 1012   Results for [name], COUGHLIN (MRN [id number]) as of 03/06/2019 12:17  Ref. Range 11/03/2018 09:47  Vitamin D, 25-Hydroxy Latest Ref Range: 30.0 - 100.0 ng/mL 52.8    I, Michaelene Song, am acting as Location manager for [employer], FNP  I have reviewed the above documentation for accuracy and completeness, and I agree with the above.  - Christiaan Strebeck, FNP-C.

## 2019-03-06 NOTE — Telephone Encounter (Signed)
Please advise 

## 2019-03-09 NOTE — Telephone Encounter (Signed)
Please review

## 2019-03-13 ENCOUNTER — Encounter (INDEPENDENT_AMBULATORY_CARE_PROVIDER_SITE_OTHER): Payer: Self-pay | Admitting: Family Medicine

## 2019-03-13 MED ORDER — SAXENDA 18 MG/3ML ~~LOC~~ SOPN: 3.0000 mL | PEN_INJECTOR | Freq: Every day | SUBCUTANEOUS | 0 refills | Status: DC

## 2019-03-13 MED ORDER — BD PEN NEEDLE NANO U/F 32G X 4 MM MISC: 1.0000 | Freq: Every day | 0 refills | Status: DC

## 2019-03-13 NOTE — Telephone Encounter (Signed)
Please advise. Do you want me to put in script for saxenda?

## 2019-03-13 NOTE — Telephone Encounter (Signed)
Done. I will submit the prior auth.

## 2019-03-16 ENCOUNTER — Ambulatory Visit (INDEPENDENT_AMBULATORY_CARE_PROVIDER_SITE_OTHER): Payer: BC Managed Care – PPO | Admitting: Family Medicine

## 2019-03-16 ENCOUNTER — Encounter (INDEPENDENT_AMBULATORY_CARE_PROVIDER_SITE_OTHER): Payer: Self-pay | Admitting: Family Medicine

## 2019-03-16 ENCOUNTER — Other Ambulatory Visit: Payer: Self-pay

## 2019-03-16 VITALS — BP 109/69 | HR 67 | Temp 98.7°F | Ht 63.0 in | Wt 197.0 lb

## 2019-03-16 DIAGNOSIS — E559 Vitamin D deficiency, unspecified: Secondary | ICD-10-CM

## 2019-03-16 DIAGNOSIS — E669 Obesity, unspecified: Secondary | ICD-10-CM | POA: Diagnosis not present

## 2019-03-16 DIAGNOSIS — Z6834 Body mass index (BMI) 34.0-34.9, adult: Secondary | ICD-10-CM | POA: Diagnosis not present

## 2019-03-16 NOTE — Progress Notes (Signed)
Office: [phone]  /  Fax: [phone]   HPI:   Chief Complaint: OBESITY Beth Rivers is here to discuss her progress with her obesity treatment plan. She is on the Category 3 plan and is following her eating plan approximately 95% of the time. She states she is walking 3-6 miles daily. Beth Rivers is down 2 lbs since here last in office visit. She stopped Contrave due to mental fogginess. We are waiting on prior authorization for Saxenda. She does report polyphagia between meals. Her weight is 197 lb (89.4 kg) today and has had a weight loss of 2 pounds over a period of 16 weeks since her last in office visit. She has lost 23 lbs since starting treatment with [place].  Vitamin D deficiency Beth Rivers has a diagnosis of Vitamin D deficiency, which is at goal (52.8). She is currently taking prescription Vit D and denies nausea, vomiting or muscle weakness.  ASSESSMENT AND PLAN:  Vitamin D deficiency - Plan: Comprehensive metabolic panel, Hemoglobin A1c, Insulin, random, Lipid Panel With LDL/HDL Ratio, VITAMIN D 25 Hydroxy (Vit-D Deficiency, Fractures)  Class 1 obesity with serious comorbidity and body mass index (BMI) of 34.0 to 34.9 in adult, unspecified obesity type  PLAN:  Vitamin D Deficiency Beth Rivers was informed that low Vitamin D levels contributes to fatigue and are associated with obesity, breast, and colon cancer. She agrees to continue taking prescription Vit D and will have routine testing of Vitamin D at her next visit. She was informed of the risk of over-replacement of Vitamin D and agrees to not increase her dose unless she discusses this with [place] first. Beth Rivers agrees to follow-up with our clinic in 2-3 weeks.  I spent > than 50% of the 15 minute visit on counseling as documented in the note.  Obesity Beth Rivers is currently in the action stage of change. As such, her goal is to continue with weight loss efforts. She has agreed to follow the Category 3 plan. Beth Rivers was advised she may have  homemade butternut squash soup (1 cup) that uses snack calories. Beth Rivers has been instructed to continue her current exercise regimen for weight loss and overall health benefits. We discussed the following Behavioral Modification Strategies today: planning for success.  Beth Rivers has agreed to follow-up with our clinic in 2-3 weeks. She was informed of the importance of frequent follow-up visits to maximize her success with intensive lifestyle modifications for her multiple health conditions.  ALLERGIES: No Known Allergies  MEDICATIONS: Current Outpatient Medications on File Prior to Visit  Medication Sig Dispense Refill  . Insulin Pen Needle (BD PEN NEEDLE NANO U/F) 32G X 4 MM MISC 1 Device by Does not apply Rivers daily. 100 each 0  . Liraglutide -Weight Management (SAXENDA) 18 MG/3ML SOPN Inject 3 mLs into the skin daily. 5 pen 0  . Naltrexone-buPROPion HCl ER (CONTRAVE) 8-90 MG TB12 Take two tabs by mouth twice daily 120 tablet 0  . triamterene-hydrochlorothiazide (MAXZIDE-25) 37.5-25 MG tablet Take 1 tablet by mouth daily. 30 tablet 2   No current facility-administered medications on file prior to visit.     PAST MEDICAL HISTORY: Past Medical History:  Diagnosis Date  . Anxiety   . Depression 09/2001  . Elevated BP   . Elevated LDL cholesterol level 1/14  . Feeling lonely   . History of uterine fibroid 11/2002  . HTN (hypertension)   . Joint pain   . Leg edema   . Obesity   . Obstructive sleep apnea  PAST SURGICAL HISTORY: History reviewed. No pertinent surgical history.  SOCIAL HISTORY: Social History   Tobacco Use  . Smoking status: Never Smoker  . Smokeless tobacco: Never Used  Substance Use Topics  . Alcohol use: Yes    Alcohol/week: 1.0 standard drinks    Types: 1 Glasses of wine per week  . Drug use: No    FAMILY HISTORY: Family History  Problem Relation Age of Onset  . Hypertension Mother   . Hyperlipidemia Mother   . Depression Mother   . Obesity  Mother   . Hypertension Father   . Heart attack Father   . Obesity Father   . Hyperlipidemia Father   . Heart disease Father   . Sudden death Daughter   . Colon cancer Maternal Grandmother    ROS: Review of Systems  Gastrointestinal: Negative for nausea and vomiting.  Musculoskeletal:       Negative for muscle weakness.  Endo/Heme/Allergies:       Positive for polyphagia between meals.   PHYSICAL EXAM: Blood pressure 109/69, pulse 67, temperature 98.7 F (37.1 C), temperature source Oral, height 5\' 3"  (1.6 m), weight 197 lb (89.4 kg), last menstrual period 09/22/2003, SpO2 97 %. Body mass index is 34.9 kg/m. Physical Exam Vitals signs reviewed.  Constitutional:      Appearance: Normal appearance. She is obese.  Cardiovascular:     Rate and Rhythm: Normal rate.     Pulses: Normal pulses.  Pulmonary:     Effort: Pulmonary effort is normal.     Breath sounds: Normal breath sounds.  Musculoskeletal: Normal range of motion.  Skin:    General: Skin is warm and dry.  Neurological:     Mental Status: She is alert and oriented to person, place, and time.  Psychiatric:        Behavior: Behavior normal.   RECENT LABS AND TESTS: BMET    Component Value Date/Time   NA 144 11/03/2018 0947   K 4.2 11/03/2018 0947   K 3.9 02/26/2016 1522   CL 105 11/03/2018 0947   CL 103 02/26/2016 1522   CO2 24 11/03/2018 0947   CO2 26 02/26/2016 1522   GLUCOSE 96 11/03/2018 0947   GLUCOSE 99 09/27/2017 1549   BUN 28 (H) 11/03/2018 0947   CREATININE 0.64 11/03/2018 0947   CREATININE 0.64 02/26/2016 1522   CREATININE 0.84 01/28/2016 1035   CALCIUM 9.2 11/03/2018 0947   CALCIUM 8.8 02/26/2016 1522   GFRNONAA 97 11/03/2018 0947   GFRAA 111 11/03/2018 0947   Lab Results  Component Value Date   HGBA1C 5.2 11/03/2018   HGBA1C 5.2 07/11/2018   HGBA1C 5.3 09/27/2017   Lab Results  Component Value Date   INSULIN 10.6 11/03/2018   INSULIN 21.0 07/11/2018   CBC    Component Value  Date/Time   WBC 6.2 07/11/2018 1404   WBC 9.9 09/27/2017 1549   RBC 4.56 07/11/2018 1404   RBC 4.90 09/27/2017 1549   HGB 14.3 07/11/2018 1404   HGB 15.9 02/26/2016 1522   HCT 42.8 07/11/2018 1404   HCT 45.5 02/26/2016 1522   PLT 253.0 09/27/2017 1549   PLT 236 02/26/2016 1522   MCV 94 07/11/2018 1404   MCV 93 02/26/2016 1522   MCH 31.4 07/11/2018 1404   MCH 32.6 02/26/2016 1522   MCH 32.4 01/28/2016 1035   MCHC 33.4 07/11/2018 1404   MCHC 34.3 09/27/2017 1549   RDW 13.7 07/11/2018 1404   RDW 13.6 02/26/2016 1522   LYMPHSABS  1.7 07/11/2018 1404   LYMPHSABS 1.7 02/26/2016 1522   EOSABS 0.1 07/11/2018 1404   EOSABS 0.2 02/26/2016 1522   BASOSABS 0.1 07/11/2018 1404   BASOSABS 0.0 02/26/2016 1522   Iron/TIBC/Ferritin/ %Sat    Component Value Date/Time   IRON 65 02/26/2016 1521   TIBC 280 02/26/2016 1521   FERRITIN 90 02/26/2016 1521   IRONPCTSAT 23 02/26/2016 1521   Lipid Panel     Component Value Date/Time   CHOL 197 11/03/2018 0947   TRIG 70 11/03/2018 0947   HDL 48 11/03/2018 0947   CHOLHDL 5 09/27/2017 1549   VLDL 36.4 09/27/2017 1549   LDLCALC 135 (H) 11/03/2018 0947   Hepatic Function Panel     Component Value Date/Time   PROT 6.8 11/03/2018 0947   ALBUMIN 4.2 11/03/2018 0947   ALBUMIN 3.9 02/26/2016 1522   AST 21 11/03/2018 0947   AST 19 02/26/2016 1522   ALT 37 (H) 11/03/2018 0947   ALT 21 02/26/2016 1522   ALKPHOS 68 11/03/2018 0947   ALKPHOS 75 02/26/2016 1522   BILITOT 0.3 11/03/2018 0947      Component Value Date/Time   TSH 2.260 07/11/2018 1404   TSH 1.25 09/27/2017 1549   TSH 0.94 11/20/2015 1012   Results for DNASIA, GAUNA (MRN [id number]) as of 03/16/2019 14:20  Ref. Range 11/03/2018 09:47  Vitamin D, 25-Hydroxy Latest Ref Range: 30.0 - 100.0 ng/mL 52.8   OBESITY BEHAVIORAL INTERVENTION VISIT  Today's visit was #14  Starting weight: 220 lbs Starting date: 07/11/2018 Today's weight: 197 lbs Today's date: 03/16/2019 Total lbs lost to  date: 75  ASK: We discussed the diagnosis of obesity with Beth Rivers today and Beth Rivers agreed to give [place] permission to discuss obesity behavioral modification therapy today.  ASSESS: Beth Rivers has the diagnosis of obesity and her BMI today is 34.9. Beth Rivers is in the action stage of change.   ADVISE: Beth Rivers was educated on the multiple health risks of obesity as well as the benefit of weight loss to improve her health. She was advised of the need for long term treatment and the importance of lifestyle modifications to improve her current health and to decrease her risk of future health problems.  AGREE: Multiple dietary modification options and treatment options were discussed and  Beth Rivers agreed to follow the recommendations documented in the above note.  ARRANGE: Beth Rivers was educated on the importance of frequent visits to treat obesity as outlined per CMS and USPSTF guidelines and agreed to schedule her next follow up appointment today.  I, Michaelene Song, am acting as Location manager for [employer], [place].  I have reviewed the above documentation for accuracy and completeness, and I agree with the above.  - Mireya Meditz, FNP-C.

## 2019-03-17 ENCOUNTER — Encounter (INDEPENDENT_AMBULATORY_CARE_PROVIDER_SITE_OTHER): Payer: Self-pay | Admitting: Family Medicine

## 2019-03-20 ENCOUNTER — Telehealth (INDEPENDENT_AMBULATORY_CARE_PROVIDER_SITE_OTHER): Payer: Self-pay | Admitting: Family Medicine

## 2019-03-20 NOTE — Telephone Encounter (Signed)
Prior authorizations can take 7-10 days for an answer with her insurance company.   Abdul Beirne

## 2019-03-20 NOTE — Telephone Encounter (Signed)
Patient states her pharmacy told her that the prescription for Saxenda has been denied.  She stated the our nurse told her it has been sent for prior auth.  Can you please confirm & advise paitent.  She stated she could really use this medication. Thank you

## 2019-03-21 ENCOUNTER — Encounter (INDEPENDENT_AMBULATORY_CARE_PROVIDER_SITE_OTHER): Payer: Self-pay

## 2019-03-28 ENCOUNTER — Encounter: Payer: Self-pay | Admitting: Internal Medicine

## 2019-03-28 ENCOUNTER — Other Ambulatory Visit: Payer: Self-pay

## 2019-03-28 ENCOUNTER — Ambulatory Visit (INDEPENDENT_AMBULATORY_CARE_PROVIDER_SITE_OTHER): Payer: BC Managed Care – PPO | Admitting: Internal Medicine

## 2019-03-28 VITALS — BP 120/88 | HR 74 | Temp 98.3°F | Ht 63.0 in | Wt 200.0 lb

## 2019-03-28 DIAGNOSIS — E6609 Other obesity due to excess calories: Secondary | ICD-10-CM | POA: Diagnosis not present

## 2019-03-28 DIAGNOSIS — Z0000 Encounter for general adult medical examination without abnormal findings: Secondary | ICD-10-CM

## 2019-03-28 DIAGNOSIS — I10 Essential (primary) hypertension: Secondary | ICD-10-CM | POA: Diagnosis not present

## 2019-03-28 DIAGNOSIS — Z6835 Body mass index (BMI) 35.0-35.9, adult: Secondary | ICD-10-CM

## 2019-03-28 NOTE — Assessment & Plan Note (Signed)
BP at goal on hctz. Recent BMP at goal without indication for change.

## 2019-03-28 NOTE — Assessment & Plan Note (Signed)
Flu shot counseled. Shingrix complete. Tetanus up to date. Colonoscopy up to date. Mammogram up to date, pap smear up to date. Counseled about sun safety and mole surveillance. Counseled about the dangers of distracted driving. Given 10 year screening recommendations.

## 2019-03-28 NOTE — Patient Instructions (Addendum)
Health Maintenance, Female Adopting a healthy lifestyle and getting preventive care are important in promoting health and wellness. Ask your health care provider about:  The right schedule for you to have regular tests and exams.  Things you can do on your own to prevent diseases and keep yourself healthy. What should I know about diet, weight, and exercise? Eat a healthy diet   Eat a diet that includes plenty of vegetables, fruits, low-fat dairy products, and lean protein.  Do not eat a lot of foods that are high in solid fats, added sugars, or sodium. Maintain a healthy weight Body mass index (BMI) is used to identify weight problems. It estimates body fat based on height and weight. Your health care provider can help determine your BMI and help you achieve or maintain a healthy weight. Get regular exercise Get regular exercise. This is one of the most important things you can do for your health. Most adults should:  Exercise for at least 150 minutes each week. The exercise should increase your heart rate and make you sweat (moderate-intensity exercise).  Do strengthening exercises at least twice a week. This is in addition to the moderate-intensity exercise.  Spend less time sitting. Even light physical activity can be beneficial. Watch cholesterol and blood lipids Have your blood tested for lipids and cholesterol at 62 years of age, then have this test every 5 years. Have your cholesterol levels checked more often if:  Your lipid or cholesterol levels are high.  You are older than 62 years of age.  You are at high risk for heart disease. What should I know about cancer screening? Depending on your health history and family history, you may need to have cancer screening at various ages. This may include screening for:  Breast cancer.  Cervical cancer.  Colorectal cancer.  Skin cancer.  Lung cancer. What should I know about heart disease, diabetes, and high blood  pressure? Blood pressure and heart disease  High blood pressure causes heart disease and increases the risk of stroke. This is more likely to develop in people who have high blood pressure readings, are of African descent, or are overweight.  Have your blood pressure checked: ? Every 3-5 years if you are 18-39 years of age. ? Every year if you are 40 years old or older. Diabetes Have regular diabetes screenings. This checks your fasting blood sugar level. Have the screening done:  Once every three years after age 40 if you are at a normal weight and have a low risk for diabetes.  More often and at a younger age if you are overweight or have a high risk for diabetes. What should I know about preventing infection? Hepatitis B If you have a higher risk for hepatitis B, you should be screened for this virus. Talk with your health care provider to find out if you are at risk for hepatitis B infection. Hepatitis C Testing is recommended for:  Everyone born from 1945 through 1965.  Anyone with known risk factors for hepatitis C. Sexually transmitted infections (STIs)  Get screened for STIs, including gonorrhea and chlamydia, if: ? You are sexually active and are younger than 62 years of age. ? You are older than 62 years of age and your health care provider tells you that you are at risk for this type of infection. ? Your sexual activity has changed since you were last screened, and you are at increased risk for chlamydia or gonorrhea. Ask your health care provider if   you are at risk.  Ask your health care provider about whether you are at high risk for HIV. Your health care provider may recommend a prescription medicine to help prevent HIV infection. If you choose to take medicine to prevent HIV, you should first get tested for HIV. You should then be tested every 3 months for as long as you are taking the medicine. Pregnancy  If you are about to stop having your period (premenopausal) and  you may become pregnant, seek counseling before you get pregnant.  Take 400 to 800 micrograms (mcg) of folic acid every day if you become pregnant.  Ask for birth control (contraception) if you want to prevent pregnancy. Osteoporosis and menopause Osteoporosis is a disease in which the bones lose minerals and strength with aging. This can result in bone fractures. If you are 65 years old or older, or if you are at risk for osteoporosis and fractures, ask your health care provider if you should:  Be screened for bone loss.  Take a calcium or vitamin D supplement to lower your risk of fractures.  Be given hormone replacement therapy (HRT) to treat symptoms of menopause. Follow these instructions at home: Lifestyle  Do not use any products that contain nicotine or tobacco, such as cigarettes, e-cigarettes, and chewing tobacco. If you need help quitting, ask your health care provider.  Do not use street drugs.  Do not share needles.  Ask your health care provider for help if you need support or information about quitting drugs. Alcohol use  Do not drink alcohol if: ? Your health care provider tells you not to drink. ? You are pregnant, may be pregnant, or are planning to become pregnant.  If you drink alcohol: ? Limit how much you use to 0-1 drink a day. ? Limit intake if you are breastfeeding.  Be aware of how much alcohol is in your drink. In the [place] one drink equals one 12 oz bottle of beer (355 mL), one 5 oz glass of wine (148 mL), or one 1 oz glass of hard liquor (44 mL). General instructions  Schedule regular health, dental, and eye exams.  Stay current with your vaccines.  Tell your health care provider if: ? You often feel depressed. ? You have ever been abused or do not feel safe at home. Summary  Adopting a healthy lifestyle and getting preventive care are important in promoting health and wellness.  Follow your health care provider's instructions about healthy  diet, exercising, and getting tested or screened for diseases.  Follow your health care provider's instructions on monitoring your cholesterol and blood pressure. This information is not intended to replace advice given to you by your health care provider. Make sure you discuss any questions you have with your health care provider. Document Released: 03/23/2011 Document Revised: 08/31/2018 Document Reviewed: 08/31/2018 Elsevier Patient Education  2020 [employer]  

## 2019-03-28 NOTE — Assessment & Plan Note (Signed)
Seeing medical weight management to help. Taking saxenda.

## 2019-03-28 NOTE — Progress Notes (Signed)
   Subjective:   Patient ID: Beth Rivers, female    DOB: 1956/06/07, 63 y.o.   MRN: [id number]  HPI The patient is a 63 YO female coming in for physical.   PMH, [place], social history reviewed and updated  Review of Systems  Constitutional: Negative.   HENT: Negative.   Eyes: Negative.   Respiratory: Negative for cough, chest tightness and shortness of breath.   Cardiovascular: Negative for chest pain, palpitations and leg swelling.  Gastrointestinal: Negative for abdominal distention, abdominal pain, constipation, diarrhea, nausea and vomiting.  Musculoskeletal: Negative.   Skin: Negative.   Neurological: Negative.   Psychiatric/Behavioral: Negative.     Objective:  Physical Exam Constitutional:      Appearance: She is well-developed. She is obese.  HENT:     Head: Normocephalic and atraumatic.  Neck:     Musculoskeletal: Normal range of motion.  Cardiovascular:     Rate and Rhythm: Normal rate and regular rhythm.  Pulmonary:     Effort: Pulmonary effort is normal. No respiratory distress.     Breath sounds: Normal breath sounds. No wheezing or rales.  Abdominal:     General: Bowel sounds are normal. There is no distension.     Palpations: Abdomen is soft.     Tenderness: There is no abdominal tenderness. There is no rebound.  Skin:    General: Skin is warm and dry.  Neurological:     Mental Status: She is alert and oriented to person, place, and time.     Coordination: Coordination normal.     Vitals:   03/28/19 1542  BP: 120/88  Pulse: 74  Temp: 98.3 F (36.8 C)  TempSrc: Oral  SpO2: 99%  Weight: 200 lb (90.7 kg)  Height: 5\' 3"  (1.6 m)    Assessment & Plan:

## 2019-03-30 ENCOUNTER — Encounter (INDEPENDENT_AMBULATORY_CARE_PROVIDER_SITE_OTHER): Payer: Self-pay | Admitting: Family Medicine

## 2019-03-30 NOTE — Telephone Encounter (Signed)
Could you please advise her next appt is in office with you on the 7/13

## 2019-04-03 ENCOUNTER — Encounter (INDEPENDENT_AMBULATORY_CARE_PROVIDER_SITE_OTHER): Payer: Self-pay | Admitting: Family Medicine

## 2019-04-03 ENCOUNTER — Other Ambulatory Visit: Payer: Self-pay

## 2019-04-03 ENCOUNTER — Ambulatory Visit (INDEPENDENT_AMBULATORY_CARE_PROVIDER_SITE_OTHER): Payer: BC Managed Care – PPO | Admitting: Family Medicine

## 2019-04-03 VITALS — BP 116/64 | HR 75 | Temp 97.8°F | Ht 63.0 in | Wt 195.0 lb

## 2019-04-03 DIAGNOSIS — Z9189 Other specified personal risk factors, not elsewhere classified: Secondary | ICD-10-CM

## 2019-04-03 DIAGNOSIS — E8881 Metabolic syndrome: Secondary | ICD-10-CM

## 2019-04-03 DIAGNOSIS — E559 Vitamin D deficiency, unspecified: Secondary | ICD-10-CM | POA: Diagnosis not present

## 2019-04-03 DIAGNOSIS — E7849 Other hyperlipidemia: Secondary | ICD-10-CM

## 2019-04-03 DIAGNOSIS — Z6834 Body mass index (BMI) 34.0-34.9, adult: Secondary | ICD-10-CM

## 2019-04-03 DIAGNOSIS — G4733 Obstructive sleep apnea (adult) (pediatric): Secondary | ICD-10-CM | POA: Diagnosis not present

## 2019-04-03 DIAGNOSIS — E669 Obesity, unspecified: Secondary | ICD-10-CM

## 2019-04-04 ENCOUNTER — Encounter: Payer: Self-pay | Admitting: Internal Medicine

## 2019-04-04 DIAGNOSIS — E7849 Other hyperlipidemia: Secondary | ICD-10-CM | POA: Diagnosis not present

## 2019-04-04 DIAGNOSIS — E8881 Metabolic syndrome: Secondary | ICD-10-CM | POA: Diagnosis not present

## 2019-04-04 DIAGNOSIS — E559 Vitamin D deficiency, unspecified: Secondary | ICD-10-CM | POA: Diagnosis not present

## 2019-04-04 NOTE — Progress Notes (Signed)
Office: [phone]  /  Fax: [phone]   HPI:   Chief Complaint: OBESITY Beth Rivers is here to discuss her progress with her obesity treatment plan. She is on the Category 3 plan and is following her eating plan approximately 100 % of the time. She states she is walking 3 to 4 miles a day. Beth Rivers continues to do well with weight loss on her Category 3 plan. She started [place] and notes decrease in hunger and she finds following her Category 3 is easier.  Her weight is 195 lb (88.5 kg) today and has had a weight loss of 2 pounds over a period of 2 to 3 weeks since her last visit. She has lost 25 lbs since starting treatment with [place].  Insulin Resistance Beth Rivers has a diagnosis of insulin resistance based on her elevated fasting insulin level >5. Although Beth Rivers's blood glucose readings are still under good control, insulin resistance puts her at greater risk of metabolic syndrome and diabetes. She is attempting to diet control to decrease risk of diabetes. She is due for labs.  At risk for diabetes Beth Rivers is at higher than average risk for developing diabetes due to her obesity and insulin resistance. She currently denies polyuria or polydipsia.  Vitamin D Deficiency Beth Rivers has a diagnosis of vitamin D deficiency. She is stable on prescription Vit D and denies nausea, vomiting or muscle weakness.  Hyperlipidemia Beth Rivers has hyperlipidemia and she is attempting to improve her cholesterol levels with intensive lifestyle modification including a low saturated fat diet, exercise and weight loss. She denies any chest pain, claudication or myalgias.  ASSESSMENT AND PLAN:  Insulin resistance - Plan: Hemoglobin A1c, Insulin, random, Comprehensive metabolic panel  Vitamin D deficiency - Plan: VITAMIN D 25 Hydroxy (Vit-D Deficiency, Fractures)  Other hyperlipidemia - Plan: Lipid Panel With LDL/HDL Ratio  At risk for diabetes mellitus  Class 1 obesity with serious comorbidity and body mass index (BMI)  of 34.0 to 34.9 in adult, unspecified obesity type  PLAN:  Insulin Resistance Beth Rivers will continue to work on weight loss, exercise, and decreasing simple carbohydrates in her diet to help decrease the risk of diabetes. We dicussed metformin including benefits and risks. She was informed that eating too many simple carbohydrates or too many calories at one sitting increases the likelihood of GI side effects. Beth Rivers agrees to continue [place], and we will check labs today. Beth Rivers agrees to follow up with our clinic in 2 to 3 weeks as directed to monitor her progress.  Diabetes risk counseling Beth Rivers was given extended (15 minutes) diabetes prevention counseling today. She is 63 y.o. female and has risk factors for diabetes including obesity and insulin resistance. We discussed intensive lifestyle modifications today with an emphasis on weight loss as well as increasing exercise and decreasing simple carbohydrates in her diet.  Vitamin D Deficiency Beth Rivers was informed that low vitamin D levels contributes to fatigue and are associated with obesity, breast, and colon cancer. Beth Rivers agrees to continue taking prescription Vit D 50,000 IU every week and will follow up for routine testing of vitamin D, at least 2-3 times per year. She was informed of the risk of over-replacement of vitamin D and agrees to not increase her dose unless she discusses this with [place] first. We will check labs today. Beth Rivers agrees to follow up with our clinic in 2 to 3 weeks.  Hyperlipidemia Beth Rivers was informed of the American Heart Association Guidelines emphasizing intensive lifestyle modifications as the first line treatment for hyperlipidemia.  We discussed many lifestyle modifications today in depth, and Beth Rivers will continue to work on decreasing saturated fats such as fatty red meat, butter and many fried foods. She will also increase vegetables and lean protein in her diet and continue to work on diet, exercise, and weight loss efforts.  We will check labs today. Beth Rivers agrees to follow up with our clinic in 2 to 3 weeks.  Obesity Beth Rivers is currently in the action stage of change. As such, her goal is to continue with weight loss efforts She has agreed to follow the Category 3 plan Beth Rivers has been instructed to work up to a goal of 150 minutes of combined cardio and strengthening exercise per week for weight loss and overall health benefits. We discussed the following Behavioral Modification Strategies today: decrease eating out We discussed various medication options to help Beth Rivers with her weight loss efforts and we both agreed to continue Saxenda at 0.6 mg.   Beth Rivers has agreed to follow up with our clinic in 2 to 3 weeks. She was informed of the importance of frequent follow up visits to maximize her success with intensive lifestyle modifications for her multiple health conditions.  ALLERGIES: No Known Allergies  MEDICATIONS: Current Outpatient Medications on File Prior to Visit  Medication Sig Dispense Refill   Insulin Pen Needle (BD PEN NEEDLE NANO U/F) 32G X 4 MM MISC 1 Device by Does not apply route daily. 100 each 0   Liraglutide -Weight Management (SAXENDA) 18 MG/3ML SOPN Inject 3 mLs into the skin daily. 5 pen 0   triamterene-hydrochlorothiazide (MAXZIDE-25) 37.5-25 MG tablet Take 1 tablet by mouth daily. 30 tablet 2   No current facility-administered medications on file prior to visit.     Beth Rivers: Beth Rivers:  Diagnosis Date   Anxiety    Depression 09/2001   Elevated BP    Elevated LDL cholesterol level 1/14   Feeling lonely    Rivers of uterine fibroid 11/2002   HTN (hypertension)    Joint pain    Leg edema    Obesity    Obstructive sleep apnea     Beth SURGICAL Rivers: Rivers reviewed. No pertinent surgical Rivers.  SOCIAL Rivers: Social Rivers   Tobacco Use   Smoking status: Never Smoker   Smokeless tobacco: Never Used  Substance Use Topics    Alcohol use: Yes    Alcohol/week: 1.0 standard drinks    Types: 1 Glasses of wine per week   Drug use: No    FAMILY Rivers: Family Rivers  Problem Relation Age of Onset   Hypertension Mother    Hyperlipidemia Mother    Depression Mother    Obesity Mother    Hypertension Father    Heart attack Father    Obesity Father    Hyperlipidemia Father    Heart disease Father    Sudden death Daughter    Colon cancer Maternal Grandmother     ROS: Review of Systems  Constitutional: Positive for weight loss.  Cardiovascular: Negative for chest pain and claudication.  Gastrointestinal: Negative for nausea and vomiting.  Genitourinary: Negative for frequency.  Musculoskeletal: Negative for myalgias.       Negative muscle weakness  Endo/Heme/Allergies: Negative for polydipsia.    PHYSICAL EXAM: Blood pressure 116/64, pulse 75, temperature 97.8 F (36.6 C), temperature source Oral, height 5\' 3"  (1.6 m), weight 195 lb (88.5 kg), last menstrual period 09/22/2003, SpO2 99 %. Body mass index is 34.54 kg/m. Physical Exam Vitals signs reviewed.  Constitutional:      Appearance: Normal appearance. She is obese.  Cardiovascular:     Rate and Rhythm: Normal rate.     Pulses: Normal pulses.  Pulmonary:     Effort: Pulmonary effort is normal.     Breath sounds: Normal breath sounds.  Musculoskeletal: Normal range of motion.  Skin:    General: Skin is warm and dry.  Neurological:     Mental Status: She is alert and oriented to person, place, and time.  Psychiatric:        Mood and Affect: Mood normal.        Behavior: Behavior normal.     RECENT LABS AND TESTS: BMET    Component Value Date/Time   NA 144 11/03/2018 0947   K 4.2 11/03/2018 0947   K 3.9 02/26/2016 1522   CL 105 11/03/2018 0947   CL 103 02/26/2016 1522   CO2 24 11/03/2018 0947   CO2 26 02/26/2016 1522   GLUCOSE 96 11/03/2018 0947   GLUCOSE 99 09/27/2017 1549   BUN 28 (H) 11/03/2018 0947    CREATININE 0.64 11/03/2018 0947   CREATININE 0.64 02/26/2016 1522   CREATININE 0.84 01/28/2016 1035   CALCIUM 9.2 11/03/2018 0947   CALCIUM 8.8 02/26/2016 1522   GFRNONAA 97 11/03/2018 0947   GFRAA 111 11/03/2018 0947   Lab Results  Component Value Date   HGBA1C 5.2 11/03/2018   HGBA1C 5.2 07/11/2018   HGBA1C 5.3 09/27/2017   Lab Results  Component Value Date   INSULIN 10.6 11/03/2018   INSULIN 21.0 07/11/2018   CBC    Component Value Date/Time   WBC 6.2 07/11/2018 1404   WBC 9.9 09/27/2017 1549   RBC 4.56 07/11/2018 1404   RBC 4.90 09/27/2017 1549   HGB 14.3 07/11/2018 1404   HGB 15.9 02/26/2016 1522   HCT 42.8 07/11/2018 1404   HCT 45.5 02/26/2016 1522   PLT 253.0 09/27/2017 1549   PLT 236 02/26/2016 1522   MCV 94 07/11/2018 1404   MCV 93 02/26/2016 1522   MCH 31.4 07/11/2018 1404   MCH 32.6 02/26/2016 1522   MCH 32.4 01/28/2016 1035   MCHC 33.4 07/11/2018 1404   MCHC 34.3 09/27/2017 1549   RDW 13.7 07/11/2018 1404   RDW 13.6 02/26/2016 1522   LYMPHSABS 1.7 07/11/2018 1404   LYMPHSABS 1.7 02/26/2016 1522   EOSABS 0.1 07/11/2018 1404   EOSABS 0.2 02/26/2016 1522   BASOSABS 0.1 07/11/2018 1404   BASOSABS 0.0 02/26/2016 1522   Iron/TIBC/Ferritin/ %Sat    Component Value Date/Time   IRON 65 02/26/2016 1521   TIBC 280 02/26/2016 1521   FERRITIN 90 02/26/2016 1521   IRONPCTSAT 23 02/26/2016 1521   Lipid Panel     Component Value Date/Time   CHOL 197 11/03/2018 0947   TRIG 70 11/03/2018 0947   HDL 48 11/03/2018 0947   CHOLHDL 5 09/27/2017 1549   VLDL 36.4 09/27/2017 1549   LDLCALC 135 (H) 11/03/2018 0947   Hepatic Function Panel     Component Value Date/Time   PROT 6.8 11/03/2018 0947   ALBUMIN 4.2 11/03/2018 0947   ALBUMIN 3.9 02/26/2016 1522   AST 21 11/03/2018 0947   AST 19 02/26/2016 1522   ALT 37 (H) 11/03/2018 0947   ALT 21 02/26/2016 1522   ALKPHOS 68 11/03/2018 0947   ALKPHOS 75 02/26/2016 1522   BILITOT 0.3 11/03/2018 0947        Component Value Date/Time   TSH 2.260 07/11/2018 1404  TSH 1.25 09/27/2017 1549   TSH 0.94 11/20/2015 1012      OBESITY BEHAVIORAL INTERVENTION VISIT  Today's visit was # 15   Starting weight: 220 lbs  Starting date: 07/11/18 Today's weight : 195 lbs Today's date: 04/03/2019 Total lbs lost to date: 25    ASK: We discussed the diagnosis of obesity with Neville Route today and [place] agreed to give [place] permission to discuss obesity behavioral modification therapy today.  ASSESS: Oletta has the diagnosis of obesity and her BMI today is 34.55 Daizee is in the action stage of change   ADVISE: Trystin was educated on the multiple health risks of obesity as well as the benefit of weight loss to improve her health. She was advised of the need for long term treatment and the importance of lifestyle modifications to improve her current health and to decrease her risk of future health problems.  AGREE: Multiple dietary modification options and treatment options were discussed and  Sevin agreed to follow the recommendations documented in the above note.  ARRANGE: Deloras was educated on the importance of frequent visits to treat obesity as outlined per CMS and USPSTF guidelines and agreed to schedule her next follow up appointment today.  I, Trixie Dredge, am acting as transcriptionist for Dennard Nip, MD  I have reviewed the above documentation for accuracy and completeness, and I agree with the above. -Dennard Nip, MD

## 2019-04-05 LAB — COMPREHENSIVE METABOLIC PANEL
ALT: 18 IU/L (ref 0–32)
AST: 17 IU/L (ref 0–40)
Albumin/Globulin Ratio: 1.5 (ref 1.2–2.2)
Albumin: 4.1 g/dL (ref 3.8–4.8)
Alkaline Phosphatase: 63 IU/L (ref 39–117)
BUN/Creatinine Ratio: 44 — ABNORMAL HIGH (ref 12–28)
BUN: 28 mg/dL — ABNORMAL HIGH (ref 8–27)
Bilirubin Total: 0.4 mg/dL (ref 0.0–1.2)
CO2: 25 mmol/L (ref 20–29)
Calcium: 9.2 mg/dL (ref 8.7–10.3)
Chloride: 105 mmol/L (ref 96–106)
Creatinine, Ser: 0.63 mg/dL (ref 0.57–1.00)
GFR calc Af Amer: 111 mL/min/{1.73_m2} (ref >59)
GFR calc non Af Amer: 96 mL/min/{1.73_m2} (ref >59)
Globulin, Total: 2.7 g/dL (ref 1.5–4.5)
Glucose: 82 mg/dL (ref 65–99)
Potassium: 4.1 mmol/L (ref 3.5–5.2)
Sodium: 143 mmol/L (ref 134–144)
Total Protein: 6.8 g/dL (ref 6.0–8.5)

## 2019-04-05 LAB — INSULIN, RANDOM: INSULIN: 10.2 u[IU]/mL (ref 2.6–24.9)

## 2019-04-05 LAB — LIPID PANEL WITH LDL/HDL RATIO
Cholesterol, Total: 210 mg/dL — ABNORMAL HIGH (ref 100–199)
HDL: 48 mg/dL (ref >39)
LDL Calculated: 146 mg/dL — ABNORMAL HIGH (ref 0–99)
LDl/HDL Ratio: 3 ratio (ref 0.0–3.2)
Triglycerides: 80 mg/dL (ref 0–149)
VLDL Cholesterol Cal: 16 mg/dL (ref 5–40)

## 2019-04-05 LAB — HEMOGLOBIN A1C
Est. average glucose Bld gHb Est-mCnc: 105 mg/dL
Hgb A1c MFr Bld: 5.3 % (ref 4.8–5.6)

## 2019-04-05 LAB — VITAMIN D 25 HYDROXY (VIT D DEFICIENCY, FRACTURES): Vit D, 25-Hydroxy: 50.1 ng/mL (ref 30.0–100.0)

## 2019-04-06 NOTE — Progress Notes (Signed)
I spoke with Beth Rivers this morning to schedule her Covid 19 test prior to her procedure on 7//23. Beth Rivers stated she will not be able to quarantine due to her job and will call her Dr to reschedule for a later date.

## 2019-04-13 ENCOUNTER — Inpatient Hospital Stay (HOSPITAL_COMMUNITY)
Admission: RE | Admit: 2019-04-13 | Discharge: 2019-04-13 | Disposition: A | Discharge status: Home / Self Care | Payer: BC Managed Care – PPO | Source: Ambulatory Visit | Attending: Internal Medicine | Admitting: Internal Medicine

## 2019-04-17 ENCOUNTER — Ambulatory Visit (INDEPENDENT_AMBULATORY_CARE_PROVIDER_SITE_OTHER): Payer: BC Managed Care – PPO | Admitting: Family Medicine

## 2019-04-18 ENCOUNTER — Ambulatory Visit (INDEPENDENT_AMBULATORY_CARE_PROVIDER_SITE_OTHER): Payer: BC Managed Care – PPO | Admitting: Family Medicine

## 2019-04-19 ENCOUNTER — Other Ambulatory Visit: Payer: Self-pay

## 2019-04-19 ENCOUNTER — Encounter (INDEPENDENT_AMBULATORY_CARE_PROVIDER_SITE_OTHER): Payer: Self-pay | Admitting: Bariatrics

## 2019-04-19 ENCOUNTER — Ambulatory Visit (INDEPENDENT_AMBULATORY_CARE_PROVIDER_SITE_OTHER): Payer: BC Managed Care – PPO | Admitting: Bariatrics

## 2019-04-19 VITALS — BP 118/75 | HR 73 | Temp 98.1°F | Ht 63.0 in | Wt 192.0 lb

## 2019-04-19 DIAGNOSIS — I10 Essential (primary) hypertension: Secondary | ICD-10-CM | POA: Diagnosis not present

## 2019-04-19 DIAGNOSIS — E669 Obesity, unspecified: Secondary | ICD-10-CM

## 2019-04-19 DIAGNOSIS — E8881 Metabolic syndrome: Secondary | ICD-10-CM | POA: Diagnosis not present

## 2019-04-19 DIAGNOSIS — Z9189 Other specified personal risk factors, not elsewhere classified: Secondary | ICD-10-CM

## 2019-04-19 DIAGNOSIS — Z6834 Body mass index (BMI) 34.0-34.9, adult: Secondary | ICD-10-CM

## 2019-04-19 MED ORDER — TRIAMTERENE-HCTZ 37.5-25 MG PO TABS: 1.0000 | ORAL_TABLET | Freq: Every day | ORAL | 0 refills | Status: DC

## 2019-04-19 NOTE — Progress Notes (Signed)
Office: [phone]  /  Fax: [phone]   HPI:   Chief Complaint: OBESITY Beth Rivers is here to discuss her progress with her obesity treatment plan. She is on the Category 3 plan and is following her eating plan approximately 100 % of the time. She states she is walking 6 miles 7 times per week. Beth Rivers normally sees Dr. Leafy Ro. She is down 3 lbs. She feels more energy. She is taking Saxenda 0.6 mg daily.  Her weight is 192 lb (87.1 kg) today and has had a weight loss of 3 pounds over a period of 2 weeks since her last visit. She has lost 28 lbs since starting treatment with [place].  Hypertension Beth Rivers is a 63 y.o. female with hypertension. Beth Rivers's blood pressure is well controlled and she denies chest pain. She is working on weight loss to help control her blood pressure with the goal of decreasing her risk of heart attack and stroke.   Insulin Resistance Beth Rivers has a diagnosis of insulin resistance based on her elevated fasting insulin level >5. Last A1c was 5.3 and insulin of 10.2. Although Beth Rivers's blood glucose readings are still under good control, insulin resistance puts her at greater risk of metabolic syndrome and diabetes. She continues to work on diet and exercise to decrease risk of diabetes.  At risk for diabetes Beth Rivers is at higher than average risk for developing diabetes due to her obesity and insulin resistance. She currently denies polyuria or polydipsia.  ASSESSMENT AND PLAN:  Essential hypertension - Plan: triamterene-hydrochlorothiazide (MAXZIDE-25) 37.5-25 MG tablet  Insulin resistance  At risk for diabetes mellitus  Class 1 obesity with serious comorbidity and body mass index (BMI) of 34.0 to 34.9 in adult, unspecified obesity type  PLAN:  Hypertension We discussed sodium restriction, working on healthy weight loss, and a regular exercise program as the means to achieve improved blood pressure control. Beth Rivers agreed with this plan and agreed to follow up as  directed. We will continue to monitor her blood pressure as well as her progress with the above lifestyle modifications. Beth Rivers agrees to continue taking Maxzide 37.5-25 mg 1 tablet PO daily #30 and we will refill for 1 month. She will watch for signs of hypotension as she continues her lifestyle modifications. Beth Rivers agrees to follow up with our clinic in 2 weeks.  Insulin Resistance Beth Rivers will continue to work on weight loss, exercise, increase protein, and decreasing simple carbohydrates in her diet to help decrease the risk of diabetes. We dicussed metformin including benefits and risks. She was informed that eating too many simple carbohydrates or too many calories at one sitting increases the likelihood of GI side effects. Beth Rivers agrees to follow up with our clinic in 2 weeks as directed to monitor her progress.  Diabetes risk counseling Beth Rivers was given extended (15 minutes) diabetes prevention counseling today. She is 63 y.o. female and has risk factors for diabetes including obesity and insulin resistance. We discussed intensive lifestyle modifications today with an emphasis on weight loss as well as increasing exercise and decreasing simple carbohydrates in her diet.  Obesity Beth Rivers is currently in the action stage of change. As such, her goal is to continue with weight loss efforts She has agreed to keep a food journal with 450-600 calories and 40+ grams of protein at supper daily and follow the Category 3 plan Beth Rivers has been instructed to work up to a goal of 150 minutes of combined cardio and strengthening exercise per week or continue walking for  weight loss and overall health benefits. We discussed the following Behavioral Modification Strategies today: increasing lean protein intake, decreasing simple carbohydrates, increasing vegetables, in crease H20 intake, decrease eating out, no skipping meals, work on meal planning and easy cooking plans, keeping healthy foods in the home, and planning  for success We discussed various medication options to help Beth Rivers with her weight loss efforts and we both agreed to continue Saxenda at 0.6 mg daily.   Beth Rivers has agreed to follow up with our clinic in 2 weeks. She was informed of the importance of frequent follow up visits to maximize her success with intensive lifestyle modifications for her multiple health conditions.  ALLERGIES: No Known Allergies  MEDICATIONS: Current Outpatient Medications on File Prior to Visit  Medication Sig Dispense Refill  . Insulin Pen Needle (BD PEN NEEDLE NANO U/F) 32G X 4 MM MISC 1 Device by Does not apply Rivers daily. 100 each 0  . Liraglutide -Weight Management (SAXENDA) 18 MG/3ML SOPN Inject 3 mLs into the skin daily. 5 pen 0   No current facility-administered medications on file prior to visit.     PAST MEDICAL HISTORY: Past Medical History:  Diagnosis Date  . Anxiety   . Depression 09/2001  . Elevated BP   . Elevated LDL cholesterol level 1/14  . Feeling lonely   . History of uterine fibroid 11/2002  . HTN (hypertension)   . Joint pain   . Leg edema   . Obesity   . Obstructive sleep apnea     PAST SURGICAL HISTORY: History reviewed. No pertinent surgical history.  SOCIAL HISTORY: Social History   Tobacco Use  . Smoking status: Never Smoker  . Smokeless tobacco: Never Used  Substance Use Topics  . Alcohol use: Yes    Alcohol/week: 1.0 standard drinks    Types: 1 Glasses of wine per week  . Drug use: No    FAMILY HISTORY: Family History  Problem Relation Age of Onset  . Hypertension Mother   . Hyperlipidemia Mother   . Depression Mother   . Obesity Mother   . Hypertension Father   . Heart attack Father   . Obesity Father   . Hyperlipidemia Father   . Heart disease Father   . Sudden death Daughter   . Colon cancer Maternal Grandmother     ROS: Review of Systems  Constitutional: Positive for weight loss.  Cardiovascular: Negative for chest pain.  Genitourinary:  Negative for frequency.  Endo/Heme/Allergies: Negative for polydipsia.    PHYSICAL EXAM: Blood pressure 118/75, pulse 73, temperature 98.1 F (36.7 C), temperature source Oral, height 5\' 3"  (1.6 m), weight 192 lb (87.1 kg), last menstrual period 09/22/2003, SpO2 97 %. Body mass index is 34.01 kg/m. Physical Exam Vitals signs reviewed.  Constitutional:      Appearance: Normal appearance. She is obese.  Cardiovascular:     Rate and Rhythm: Normal rate.     Pulses: Normal pulses.  Pulmonary:     Effort: Pulmonary effort is normal.     Breath sounds: Normal breath sounds.  Musculoskeletal: Normal range of motion.  Skin:    General: Skin is warm and dry.  Neurological:     Mental Status: She is alert and oriented to person, place, and time.  Psychiatric:        Mood and Affect: Mood normal.        Behavior: Behavior normal.     RECENT LABS AND TESTS: BMET    Component Value Date/Time  NA 143 04/04/2019 0907   K 4.1 04/04/2019 0907   K 3.9 02/26/2016 1522   CL 105 04/04/2019 0907   CL 103 02/26/2016 1522   CO2 25 04/04/2019 0907   CO2 26 02/26/2016 1522   GLUCOSE 82 04/04/2019 0907   GLUCOSE 99 09/27/2017 1549   BUN 28 (H) 04/04/2019 0907   CREATININE 0.63 04/04/2019 0907   CREATININE 0.64 02/26/2016 1522   CREATININE 0.84 01/28/2016 1035   CALCIUM 9.2 04/04/2019 0907   CALCIUM 8.8 02/26/2016 1522   GFRNONAA 96 04/04/2019 0907   GFRAA 111 04/04/2019 0907   Lab Results  Component Value Date   HGBA1C 5.3 04/04/2019   HGBA1C 5.2 11/03/2018   HGBA1C 5.2 07/11/2018   HGBA1C 5.3 09/27/2017   Lab Results  Component Value Date   INSULIN 10.2 04/04/2019   INSULIN 10.6 11/03/2018   INSULIN 21.0 07/11/2018   CBC    Component Value Date/Time   WBC 6.2 07/11/2018 1404   WBC 9.9 09/27/2017 1549   RBC 4.56 07/11/2018 1404   RBC 4.90 09/27/2017 1549   HGB 14.3 07/11/2018 1404   HGB 15.9 02/26/2016 1522   HCT 42.8 07/11/2018 1404   HCT 45.5 02/26/2016 1522   PLT  253.0 09/27/2017 1549   PLT 236 02/26/2016 1522   MCV 94 07/11/2018 1404   MCV 93 02/26/2016 1522   MCH 31.4 07/11/2018 1404   MCH 32.6 02/26/2016 1522   MCH 32.4 01/28/2016 1035   MCHC 33.4 07/11/2018 1404   MCHC 34.3 09/27/2017 1549   RDW 13.7 07/11/2018 1404   RDW 13.6 02/26/2016 1522   LYMPHSABS 1.7 07/11/2018 1404   LYMPHSABS 1.7 02/26/2016 1522   EOSABS 0.1 07/11/2018 1404   EOSABS 0.2 02/26/2016 1522   BASOSABS 0.1 07/11/2018 1404   BASOSABS 0.0 02/26/2016 1522   Iron/TIBC/Ferritin/ %Sat    Component Value Date/Time   IRON 65 02/26/2016 1521   TIBC 280 02/26/2016 1521   FERRITIN 90 02/26/2016 1521   IRONPCTSAT 23 02/26/2016 1521   Lipid Panel     Component Value Date/Time   CHOL 210 (H) 04/04/2019 0907   TRIG 80 04/04/2019 0907   HDL 48 04/04/2019 0907   CHOLHDL 5 09/27/2017 1549   VLDL 36.4 09/27/2017 1549   LDLCALC 146 (H) 04/04/2019 0907   Hepatic Function Panel     Component Value Date/Time   PROT 6.8 04/04/2019 0907   ALBUMIN 4.1 04/04/2019 0907   ALBUMIN 3.9 02/26/2016 1522   AST 17 04/04/2019 0907   AST 19 02/26/2016 1522   ALT 18 04/04/2019 0907   ALT 21 02/26/2016 1522   ALKPHOS 63 04/04/2019 0907   ALKPHOS 75 02/26/2016 1522   BILITOT 0.4 04/04/2019 0907      Component Value Date/Time   TSH 2.260 07/11/2018 1404   TSH 1.25 09/27/2017 1549   TSH 0.94 11/20/2015 1012      OBESITY BEHAVIORAL INTERVENTION VISIT  Today's visit was # 16   Starting weight: 220 lbs Starting date: 07/11/18 Today's weight : 192 lbs Today's date: 04/19/2019 Total lbs lost to date: 28    ASK: We discussed the diagnosis of obesity with Beth Rivers today and Beth Rivers agreed to give [place] permission to discuss obesity behavioral modification therapy today.  ASSESS: Kamani has the diagnosis of obesity and her BMI today is 34.02 Beth Rivers is in the action stage of change   ADVISE: Beth Rivers was educated on the multiple health risks of obesity as well as the benefit of  weight loss to improve her health. She was advised of the need for long term treatment and the importance of lifestyle modifications to improve her current health and to decrease her risk of future health problems.  AGREE: Multiple dietary modification options and treatment options were discussed and  Beth Rivers agreed to follow the recommendations documented in the above note.  ARRANGE: Beth Rivers was educated on the importance of frequent visits to treat obesity as outlined per CMS and USPSTF guidelines and agreed to schedule her next follow up appointment today.  Beth Rivers, am acting as transcriptionist for [employer], DO  I have reviewed the above documentation for accuracy and completeness, and I agree with the above. -Jearld Lesch, DO

## 2019-05-08 ENCOUNTER — Encounter (INDEPENDENT_AMBULATORY_CARE_PROVIDER_SITE_OTHER): Payer: Self-pay | Admitting: Family Medicine

## 2019-05-08 ENCOUNTER — Telehealth (INDEPENDENT_AMBULATORY_CARE_PROVIDER_SITE_OTHER): Payer: BC Managed Care – PPO | Admitting: Family Medicine

## 2019-05-08 ENCOUNTER — Other Ambulatory Visit: Payer: Self-pay

## 2019-05-08 DIAGNOSIS — E669 Obesity, unspecified: Secondary | ICD-10-CM | POA: Diagnosis not present

## 2019-05-08 DIAGNOSIS — Z6834 Body mass index (BMI) 34.0-34.9, adult: Secondary | ICD-10-CM

## 2019-05-08 DIAGNOSIS — I10 Essential (primary) hypertension: Secondary | ICD-10-CM

## 2019-05-09 NOTE — Progress Notes (Signed)
Office: [phone]  /  Fax: [phone] TeleHealth Visit:  Beth Rivers has verbally consented to this TeleHealth visit today. The patient is located at work, the provider is located at the [employer] [facility] office. The participants in this visit include the listed provider and patient. The visit was conducted today via Webex.  HPI:   Chief Complaint: OBESITY Beth Rivers is here to discuss her progress with her obesity treatment plan. She is on the Category 3 plan and is following her eating plan approximately 90% of the time. She states she is walking 6 miles 7 times per week. Beth Rivers has lost 2 lbs since her last in-office visit. She is currently taking Saxenda 0.6 mg daily and it is working well. It increases her satiety and decreases her appetite. We were unable to weigh the patient today for this TeleHealth visit. She feels as if she has lost 2 lbs since her last visit. She has lost 28 lbs since starting treatment with [place].  Hypertension Beth Rivers is a 63 y.o. female with hypertension, well controlled on Maxzide.  Beth Rivers denies chest pain or shortness of breath on exertion. She is working weight loss to help control her blood pressure with the goal of decreasing her risk of heart attack and stroke. BP Readings from Last 3 Encounters:  04/19/19 118/75  04/03/19 116/64  03/28/19 120/88    ASSESSMENT AND PLAN:  Essential hypertension  Class 1 obesity with serious comorbidity and body mass index (BMI) of 34.0 to 34.9 in adult, unspecified obesity type  PLAN:  Hypertension We discussed sodium restriction, working on healthy weight loss, and a regular exercise program as the means to achieve improved blood pressure control. Beth Rivers agreed with this plan and agreed to follow up as directed. We will continue to monitor her blood pressure as well as her progress with the above lifestyle modifications. She will continue Maxzide as prescribed and will watch for signs of  hypotension as she continues her lifestyle modifications.  Obesity Beth Rivers is currently in the action stage of change. As such, her goal is to continue with weight loss efforts. She has agreed to follow the Category 3 plan and journal 450-600 calories + 40 grams of protein at supper. Beth Rivers has been instructed to continue walking 6 miles 7 times per week and add weight training 2-3 days per week for weight loss and overall health benefits. We discussed the following Behavioral Modification Strategies today: increasing lean protein intake and planning for success. Beth Rivers will continue Saxenda 0.6 mg daily.  Beth Rivers has agreed to follow-up with our clinic in 2 weeks. She was informed of the importance of frequent follow-up visits to maximize her success with intensive lifestyle modifications for her multiple health conditions.  ALLERGIES: No Known Allergies  MEDICATIONS: Current Outpatient Medications on File Prior to Visit  Medication Sig Dispense Refill  . Insulin Pen Needle (BD PEN NEEDLE NANO U/F) 32G X 4 MM MISC 1 Device by Does not apply route daily. 100 each 0  . Liraglutide -Weight Management (SAXENDA) 18 MG/3ML SOPN Inject 3 mLs into the skin daily. 5 pen 0  . triamterene-hydrochlorothiazide (MAXZIDE-25) 37.5-25 MG tablet Take 1 tablet by mouth daily. 30 tablet 0   No current facility-administered medications on file prior to visit.     PAST MEDICAL HISTORY: Past Medical History:  Diagnosis Date  . Anxiety   . Depression 09/2001  . Elevated BP   . Elevated LDL cholesterol level 1/14  . Feeling lonely   .  History of uterine fibroid 11/2002  . HTN (hypertension)   . Joint pain   . Leg edema   . Obesity   . Obstructive sleep apnea     PAST SURGICAL HISTORY: History reviewed. No pertinent surgical history.  SOCIAL HISTORY: Social History   Tobacco Use  . Smoking status: Never Smoker  . Smokeless tobacco: Never Used  Substance Use Topics  . Alcohol use: Yes     Alcohol/week: 1.0 standard drinks    Types: 1 Glasses of wine per week  . Drug use: No    FAMILY HISTORY: Family History  Problem Relation Age of Onset  . Hypertension Mother   . Hyperlipidemia Mother   . Depression Mother   . Obesity Mother   . Hypertension Father   . Heart attack Father   . Obesity Father   . Hyperlipidemia Father   . Heart disease Father   . Sudden death Daughter   . Colon cancer Maternal Grandmother    ROS: Review of Systems  Respiratory: Negative for shortness of breath.   Cardiovascular: Negative for chest pain.   PHYSICAL EXAM: Pt in no acute distress  RECENT LABS AND TESTS: BMET    Component Value Date/Time   NA 143 04/04/2019 0907   K 4.1 04/04/2019 0907   K 3.9 02/26/2016 1522   CL 105 04/04/2019 0907   CL 103 02/26/2016 1522   CO2 25 04/04/2019 0907   CO2 26 02/26/2016 1522   GLUCOSE 82 04/04/2019 0907   GLUCOSE 99 09/27/2017 1549   BUN 28 (H) 04/04/2019 0907   CREATININE 0.63 04/04/2019 0907   CREATININE 0.64 02/26/2016 1522   CREATININE 0.84 01/28/2016 1035   CALCIUM 9.2 04/04/2019 0907   CALCIUM 8.8 02/26/2016 1522   GFRNONAA 96 04/04/2019 0907   GFRAA 111 04/04/2019 0907   Lab Results  Component Value Date   HGBA1C 5.3 04/04/2019   HGBA1C 5.2 11/03/2018   HGBA1C 5.2 07/11/2018   HGBA1C 5.3 09/27/2017   Lab Results  Component Value Date   INSULIN 10.2 04/04/2019   INSULIN 10.6 11/03/2018   INSULIN 21.0 07/11/2018   CBC    Component Value Date/Time   WBC 6.2 07/11/2018 1404   WBC 9.9 09/27/2017 1549   RBC 4.56 07/11/2018 1404   RBC 4.90 09/27/2017 1549   HGB 14.3 07/11/2018 1404   HGB 15.9 02/26/2016 1522   HCT 42.8 07/11/2018 1404   HCT 45.5 02/26/2016 1522   PLT 253.0 09/27/2017 1549   PLT 236 02/26/2016 1522   MCV 94 07/11/2018 1404   MCV 93 02/26/2016 1522   MCH 31.4 07/11/2018 1404   MCH 32.6 02/26/2016 1522   MCH 32.4 01/28/2016 1035   MCHC 33.4 07/11/2018 1404   MCHC 34.3 09/27/2017 1549   RDW 13.7  07/11/2018 1404   RDW 13.6 02/26/2016 1522   LYMPHSABS 1.7 07/11/2018 1404   LYMPHSABS 1.7 02/26/2016 1522   EOSABS 0.1 07/11/2018 1404   EOSABS 0.2 02/26/2016 1522   BASOSABS 0.1 07/11/2018 1404   BASOSABS 0.0 02/26/2016 1522   Iron/TIBC/Ferritin/ %Sat    Component Value Date/Time   IRON 65 02/26/2016 1521   TIBC 280 02/26/2016 1521   FERRITIN 90 02/26/2016 1521   IRONPCTSAT 23 02/26/2016 1521   Lipid Panel     Component Value Date/Time   CHOL 210 (H) 04/04/2019 0907   TRIG 80 04/04/2019 0907   HDL 48 04/04/2019 0907   CHOLHDL 5 09/27/2017 1549   VLDL 36.4 09/27/2017 1549  LDLCALC 146 (H) 04/04/2019 0907   Hepatic Function Panel     Component Value Date/Time   PROT 6.8 04/04/2019 0907   ALBUMIN 4.1 04/04/2019 0907   ALBUMIN 3.9 02/26/2016 1522   AST 17 04/04/2019 0907   AST 19 02/26/2016 1522   ALT 18 04/04/2019 0907   ALT 21 02/26/2016 1522   ALKPHOS 63 04/04/2019 0907   ALKPHOS 75 02/26/2016 1522   BILITOT 0.4 04/04/2019 0907      Component Value Date/Time   TSH 2.260 07/11/2018 1404   TSH 1.25 09/27/2017 1549   TSH 0.94 11/20/2015 1012   Results for SLOAN, GALENTINE (MRN [id number]) as of 05/09/2019 08:27  Ref. Range 04/04/2019 09:07  Vitamin D, 25-Hydroxy Latest Ref Range: 30.0 - 100.0 ng/mL 50.1   I, Michaelene Song, am acting as Location manager for [employer], FNP  I have reviewed the above documentation for accuracy and completeness, and I agree with the above.  - Shad Ledvina, FNP-C.

## 2019-05-22 ENCOUNTER — Ambulatory Visit (INDEPENDENT_AMBULATORY_CARE_PROVIDER_SITE_OTHER): Payer: BC Managed Care – PPO | Admitting: Family Medicine

## 2019-05-24 ENCOUNTER — Encounter (INDEPENDENT_AMBULATORY_CARE_PROVIDER_SITE_OTHER): Payer: Self-pay | Admitting: Family Medicine

## 2019-05-24 NOTE — Telephone Encounter (Signed)
Please Advise

## 2019-06-05 ENCOUNTER — Ambulatory Visit (INDEPENDENT_AMBULATORY_CARE_PROVIDER_SITE_OTHER): Payer: BC Managed Care – PPO | Admitting: Family Medicine

## 2019-06-19 ENCOUNTER — Ambulatory Visit (INDEPENDENT_AMBULATORY_CARE_PROVIDER_SITE_OTHER): Payer: BC Managed Care – PPO | Admitting: Family Medicine

## 2019-06-19 ENCOUNTER — Other Ambulatory Visit: Payer: Self-pay

## 2019-06-19 ENCOUNTER — Encounter (INDEPENDENT_AMBULATORY_CARE_PROVIDER_SITE_OTHER): Payer: Self-pay | Admitting: Family Medicine

## 2019-06-19 VITALS — BP 119/69 | HR 72 | Temp 97.9°F | Ht 63.0 in | Wt 196.0 lb

## 2019-06-19 DIAGNOSIS — E559 Vitamin D deficiency, unspecified: Secondary | ICD-10-CM

## 2019-06-19 DIAGNOSIS — Z9189 Other specified personal risk factors, not elsewhere classified: Secondary | ICD-10-CM

## 2019-06-19 DIAGNOSIS — I10 Essential (primary) hypertension: Secondary | ICD-10-CM

## 2019-06-19 DIAGNOSIS — E669 Obesity, unspecified: Secondary | ICD-10-CM

## 2019-06-19 DIAGNOSIS — Z6834 Body mass index (BMI) 34.0-34.9, adult: Secondary | ICD-10-CM

## 2019-06-19 MED ORDER — BD PEN NEEDLE NANO U/F 32G X 4 MM MISC: 1.0000 | Freq: Every day | 0 refills | Status: DC

## 2019-06-19 MED ORDER — SAXENDA 18 MG/3ML ~~LOC~~ SOPN: 3.0000 mg | PEN_INJECTOR | Freq: Every day | SUBCUTANEOUS | 0 refills | Status: DC

## 2019-06-19 MED ORDER — TRIAMTERENE-HCTZ 37.5-25 MG PO TABS: 1.0000 | ORAL_TABLET | Freq: Every day | ORAL | 0 refills | Status: DC

## 2019-06-20 ENCOUNTER — Encounter (INDEPENDENT_AMBULATORY_CARE_PROVIDER_SITE_OTHER): Payer: Self-pay | Admitting: Family Medicine

## 2019-06-20 NOTE — Progress Notes (Signed)
Office: [phone]  /  Fax: [phone]   HPI:   Chief Complaint: OBESITY Beth Rivers is here to discuss her progress with her obesity treatment plan. She is on the  follow the Category 3 plan and is following her eating plan approximately 80 % of the time. She states she is exercising by walking for 60 minutes 4 times per week. Beth Rivers reports saxenda is still helping with her appetite. She is at 1.2 mg dose. She has been off plan recently while on vacation. She has also been baking and eating some of the baked goods. Her weight is 196 lb (88.9 kg) today and has had a weight gain of 4 pounds over a period of 4 weeks since her last visit. She has lost 24 lbs since starting treatment with [place].  Hypertension Beth Rivers is a 63 y.o. female with hypertension.  Beth Rivers denies chest pain or shortness of breath on exertion. She is working weight loss to help control her blood pressure with the goal of decreasing her risk of heart attack and stroke. Beth Rivers blood pressure is currently controlled on Maxzide. BP Readings from Last 3 Encounters:  06/19/19 119/69  04/19/19 118/75  04/03/19 116/64     At risk for cardiovascular disease Beth Rivers is at a higher than average risk for cardiovascular disease due to obesity. She currently denies any chest pain.  Vitamin D deficiency Beth Rivers has a diagnosis of vitamin D deficiency. She is currently taking OTC vit D and denies nausea, vomiting or muscle weakness. Vit D level is at goal.   Ref. Range 04/04/2019 09:07  Vitamin D, 25-Hydroxy Latest Ref Range: 30.0 - 100.0 ng/mL 50.1   Depression with emotional eating behaviors Beth Rivers is struggling with emotional eating and using food for comfort to the extent that it is negatively impacting her health. She often snacks when she is not hungry. Beth Rivers sometimes feels she is out of control and then feels guilty that she made poor food choices. She has been working on behavior modification techniques to help reduce her  emotional eating and has been somewhat successful. She is having cravings at night. We discussed Topamax and she declined for now.  She shows no sign of suicidal or homicidal ideations.  Depression screen Beth Rivers 2/9 07/27/2018 07/11/2018 09/27/2017 11/20/2015  Decreased Interest 0 3 1 0  Down, Depressed, Hopeless 0 2 0 0  PHQ - 2 Score 0 5 1 0  Altered sleeping 2 3 - -  Tired, decreased energy 1 3 - -  Change in appetite 0 3 - -  Feeling bad or failure about yourself  0 2 - -  Trouble concentrating 0 0 - -  Moving slowly or fidgety/restless 0 0 - -  Suicidal thoughts 0 0 - -  PHQ-9 Score 3 16 - -  Difficult doing work/chores - Not difficult at all - -     ASSESSMENT AND PLAN:  Essential hypertension - Plan: triamterene-hydrochlorothiazide (MAXZIDE-25) 37.5-25 MG tablet  Vitamin D deficiency  At risk for heart disease  Class 1 obesity with serious comorbidity and body mass index (BMI) of 34.0 to 34.9 in adult, unspecified obesity type - Plan: Liraglutide -Weight Management (SAXENDA) 18 MG/3ML SOPN, Insulin Pen Needle (BD PEN NEEDLE NANO U/F) 32G X 4 MM MISC  PLAN: Hypertension We discussed sodium restriction, working on healthy weight loss, and a regular exercise program as the means to achieve improved blood pressure control. Beth Rivers agreed with this plan and agreed to follow up as directed.  We will continue to monitor her blood pressure as well as her progress with the above lifestyle modifications. She will continue her medications as prescribed. Maxzide 37.5-25 mg #30 with no refills sent in today and will watch for signs of hypotension as she continues her lifestyle modifications.  Cardiovascular risk counseling Beth Rivers was given extended (15 minutes) coronary artery disease prevention counseling today. She is 63 y.o. female and has risk factors for heart disease including obesity. We discussed intensive lifestyle modifications today with an emphasis on specific weight loss instructions and  strategies. Pt was also informed of the importance of increasing exercise and decreasing saturated fats to help prevent heart disease.  Vitamin D Deficiency Beth Rivers was informed that low vitamin D levels contributes to fatigue and are associated with obesity, breast, and colon cancer. She agrees to continue to take OTC vit D and will follow up for routine testing of vitamin D, at least 2-3 times per year. She was informed of the risk of over-replacement of vitamin D and agrees to not increase her dose unless she discusses this with [place] first. Agrees to follow up with our clinic as directed.   Depression with Emotional Eating Behaviors We discussed behavior modification techniques today to help Beth Rivers deal with her emotional eating and depression. She has  agreed to follow up as directed.  Obesity Beth Rivers is currently in the action stage of change. As such, her goal is to continue with weight loss efforts She has agreed to follow the Category 3 plan Beth Rivers has been instructed continue exercise as above per week for weight loss and overall health benefits. We discussed the following Behavioral Modification Strategies today:increasing water intake, planning for success, emotional eating strategies and ways to avoid boredom eating.  Beth Rivers agrees to continue to take Saxenda 1.2 mg daily #5 with no refills sent today.   Beth Rivers has agreed to follow up with our clinic in 2-3 weeks. She was informed of the importance of frequent follow up visits to maximize her success with intensive lifestyle modifications for her multiple health conditions.  ALLERGIES: No Known Allergies  MEDICATIONS: Current Outpatient Medications on File Prior to Visit  Medication Sig Dispense Refill  . cholecalciferol (VITAMIN D3) 25 MCG (1000 UT) tablet Take 2,000 Units by mouth daily.     No current facility-administered medications on file prior to visit.     PAST MEDICAL HISTORY: Past Medical History:  Diagnosis Date  .  Anxiety   . Depression 09/2001  . Elevated BP   . Elevated LDL cholesterol level 1/14  . Feeling lonely   . History of uterine fibroid 11/2002  . HTN (hypertension)   . Joint pain   . Leg edema   . Obesity   . Obstructive sleep apnea     PAST SURGICAL HISTORY: No past surgical history on file.  SOCIAL HISTORY: Social History   Tobacco Use  . Smoking status: Never Smoker  . Smokeless tobacco: Never Used  Substance Use Topics  . Alcohol use: Yes    Alcohol/week: 1.0 standard drinks    Types: 1 Glasses of wine per week  . Drug use: No    FAMILY HISTORY: Family History  Problem Relation Age of Onset  . Hypertension Mother   . Hyperlipidemia Mother   . Depression Mother   . Obesity Mother   . Hypertension Father   . Heart attack Father   . Obesity Father   . Hyperlipidemia Father   . Heart disease Father   .  Sudden death Daughter   . Colon cancer Maternal Grandmother     ROS: Review of Systems  Constitutional: Negative for weight loss.  Respiratory: Negative for shortness of breath.   Cardiovascular: Negative for chest pain.  Gastrointestinal: Negative for nausea and vomiting.  Musculoskeletal:       Negative for muscle weakness  Psychiatric/Behavioral: Negative for depression.    PHYSICAL EXAM: Blood pressure 119/69, pulse 72, temperature 97.9 F (36.6 C), temperature source Oral, height 5\' 3"  (1.6 m), weight 196 lb (88.9 kg), last menstrual period 09/22/2003, SpO2 98 %. Body mass index is 34.72 kg/m. Physical Exam Vitals signs reviewed.  Constitutional:      Appearance: Normal appearance. She is obese.  HENT:     Head: Normocephalic.     Nose: Nose normal.  Neck:     Musculoskeletal: Normal range of motion.  Cardiovascular:     Rate and Rhythm: Normal rate.  Pulmonary:     Effort: Pulmonary effort is normal.  Musculoskeletal: Normal range of motion.  Skin:    General: Skin is warm and dry.  Neurological:     Mental Status: She is alert and  oriented to person, place, and time.  Psychiatric:        Mood and Affect: Mood normal.        Behavior: Behavior normal.     RECENT LABS AND TESTS: BMET    Component Value Date/Time   NA 143 04/04/2019 0907   K 4.1 04/04/2019 0907   K 3.9 02/26/2016 1522   CL 105 04/04/2019 0907   CL 103 02/26/2016 1522   CO2 25 04/04/2019 0907   CO2 26 02/26/2016 1522   GLUCOSE 82 04/04/2019 0907   GLUCOSE 99 09/27/2017 1549   BUN 28 (H) 04/04/2019 0907   CREATININE 0.63 04/04/2019 0907   CREATININE 0.64 02/26/2016 1522   CREATININE 0.84 01/28/2016 1035   CALCIUM 9.2 04/04/2019 0907   CALCIUM 8.8 02/26/2016 1522   GFRNONAA 96 04/04/2019 0907   GFRAA 111 04/04/2019 0907   Lab Results  Component Value Date   HGBA1C 5.3 04/04/2019   HGBA1C 5.2 11/03/2018   HGBA1C 5.2 07/11/2018   HGBA1C 5.3 09/27/2017   Lab Results  Component Value Date   INSULIN 10.2 04/04/2019   INSULIN 10.6 11/03/2018   INSULIN 21.0 07/11/2018   CBC    Component Value Date/Time   WBC 6.2 07/11/2018 1404   WBC 9.9 09/27/2017 1549   RBC 4.56 07/11/2018 1404   RBC 4.90 09/27/2017 1549   HGB 14.3 07/11/2018 1404   HGB 15.9 02/26/2016 1522   HCT 42.8 07/11/2018 1404   HCT 45.5 02/26/2016 1522   PLT 253.0 09/27/2017 1549   PLT 236 02/26/2016 1522   MCV 94 07/11/2018 1404   MCV 93 02/26/2016 1522   MCH 31.4 07/11/2018 1404   MCH 32.6 02/26/2016 1522   MCH 32.4 01/28/2016 1035   MCHC 33.4 07/11/2018 1404   MCHC 34.3 09/27/2017 1549   RDW 13.7 07/11/2018 1404   RDW 13.6 02/26/2016 1522   LYMPHSABS 1.7 07/11/2018 1404   LYMPHSABS 1.7 02/26/2016 1522   EOSABS 0.1 07/11/2018 1404   EOSABS 0.2 02/26/2016 1522   BASOSABS 0.1 07/11/2018 1404   BASOSABS 0.0 02/26/2016 1522   Iron/TIBC/Ferritin/ %Sat    Component Value Date/Time   IRON 65 02/26/2016 1521   TIBC 280 02/26/2016 1521   FERRITIN 90 02/26/2016 1521   IRONPCTSAT 23 02/26/2016 1521   Lipid Panel     Component  Value Date/Time   CHOL 210 (H)  04/04/2019 0907   TRIG 80 04/04/2019 0907   HDL 48 04/04/2019 0907   CHOLHDL 5 09/27/2017 1549   VLDL 36.4 09/27/2017 1549   LDLCALC 146 (H) 04/04/2019 0907   Hepatic Function Panel     Component Value Date/Time   PROT 6.8 04/04/2019 0907   ALBUMIN 4.1 04/04/2019 0907   ALBUMIN 3.9 02/26/2016 1522   AST 17 04/04/2019 0907   AST 19 02/26/2016 1522   ALT 18 04/04/2019 0907   ALT 21 02/26/2016 1522   ALKPHOS 63 04/04/2019 0907   ALKPHOS 75 02/26/2016 1522   BILITOT 0.4 04/04/2019 0907      Component Value Date/Time   TSH 2.260 07/11/2018 1404   TSH 1.25 09/27/2017 1549   TSH 0.94 11/20/2015 1012     Ref. Range 04/04/2019 09:07  Vitamin D, 25-Hydroxy Latest Ref Range: 30.0 - 100.0 ng/mL 50.1    OBESITY BEHAVIORAL INTERVENTION VISIT  Today's visit was # 18   Starting weight: 220 lbs Starting date: 07/11/18 Today's weight : Weight: 196 lb (88.9 kg)  Today's date: 06/19/19 Total lbs lost to date: 24 lbs At least 15 minutes were spent on discussing the following behavioral intervention visit.   ASK: We discussed the diagnosis of obesity with Beth Rivers today and Beth Rivers agreed to give [place] permission to discuss obesity behavioral modification therapy today.  ASSESS: Beth Rivers has the diagnosis of obesity and her BMI today is 34.73 Anah is in the action stage of change   ADVISE: Flora was educated on the multiple health risks of obesity as well as the benefit of weight loss to improve her health. She was advised of the need for long term treatment and the importance of lifestyle modifications to improve her current health and to decrease her risk of future health problems.  AGREE: Multiple dietary modification options and treatment options were discussed and  Beth Rivers agreed to follow the recommendations documented in the above note.  ARRANGE: Beth Rivers was educated on the importance of frequent visits to treat obesity as outlined per CMS and USPSTF guidelines and agreed to  schedule her next follow up appointment today.  I, Renee Ramus, am acting as Location manager for [employer], FNP-C.  I have reviewed the above documentation for accuracy and completeness, and I agree with the above.  - Amado Andal, FNP-C.

## 2019-06-21 DIAGNOSIS — H02831 Dermatochalasis of right upper eyelid: Secondary | ICD-10-CM | POA: Diagnosis not present

## 2019-06-21 DIAGNOSIS — H02834 Dermatochalasis of left upper eyelid: Secondary | ICD-10-CM | POA: Diagnosis not present

## 2019-06-22 ENCOUNTER — Ambulatory Visit: Payer: BC Managed Care – PPO

## 2019-06-28 DIAGNOSIS — Z23 Encounter for immunization: Secondary | ICD-10-CM | POA: Diagnosis not present

## 2019-07-11 ENCOUNTER — Other Ambulatory Visit: Payer: Self-pay

## 2019-07-11 ENCOUNTER — Encounter (INDEPENDENT_AMBULATORY_CARE_PROVIDER_SITE_OTHER): Payer: Self-pay | Admitting: Family Medicine

## 2019-07-11 ENCOUNTER — Telehealth (INDEPENDENT_AMBULATORY_CARE_PROVIDER_SITE_OTHER): Payer: BC Managed Care – PPO | Admitting: Family Medicine

## 2019-07-11 DIAGNOSIS — E8881 Metabolic syndrome: Secondary | ICD-10-CM

## 2019-07-11 DIAGNOSIS — E88819 Insulin resistance, unspecified: Secondary | ICD-10-CM

## 2019-07-11 DIAGNOSIS — Z6834 Body mass index (BMI) 34.0-34.9, adult: Secondary | ICD-10-CM

## 2019-07-11 DIAGNOSIS — E669 Obesity, unspecified: Secondary | ICD-10-CM | POA: Diagnosis not present

## 2019-07-11 DIAGNOSIS — I10 Essential (primary) hypertension: Secondary | ICD-10-CM

## 2019-07-11 MED ORDER — TRIAMTERENE-HCTZ 37.5-25 MG PO TABS: 1.0000 | ORAL_TABLET | Freq: Every day | ORAL | 0 refills | Status: DC

## 2019-07-13 NOTE — Progress Notes (Signed)
Office: [phone]  /  Fax: [phone] TeleHealth Visit:  Beth Rivers has verbally consented to this TeleHealth visit today. The patient is located at home, the provider is located at the [employer] [facility] office. The participants in this visit include the listed provider and patient and any and all parties involved. The visit was conducted today via telephone. Beth Rivers was unable to use realtime audiovisual technology today and the telehealth visit was conducted via telephone (28 minute call).   HPI:   Chief Complaint: OBESITY Beth Rivers is here to discuss her progress with her obesity treatment plan. She is on the Category 3 plan and is following her eating plan approximately 75 % of the time. She states she is walking 60 to 90 minutes 3 times per week. Beth Rivers feel she is self-sabotaging. She is having a lot of stress due to marital issues. She does tend to bake when stressed and then eat some of the baked goods. We were unable to weigh the patient today for this TeleHealth visit. She feels as if she has maintained weight since her last visit. She has lost 24 lbs since starting treatment with [place]. Beth Rivers is on Saxenda 1.2 mg daily. She admits to polyphagia in the evenings.  Hypertension Beth Rivers is a 63 y.o. female with hypertension. HTN is well controlled on Maxzide. Beth Rivers denies chest pain or shortness of breath on exertion. She is working weight loss to help control her blood pressure with the goal of decreasing her risk of heart attack and stroke.  BP Readings from Last 3 Encounters:  06/19/19 119/69  04/19/19 118/75  04/03/19 116/64    Insulin Resistance Beth Rivers has a diagnosis of insulin resistance based on her elevated fasting insulin level >5. Although Beth Rivers's blood glucose readings are still under good control, insulin resistance puts her at greater risk of metabolic syndrome and diabetes. Her last A1c was at 5.3 and her fasting insulin was at 10.2 on 04/04/19. Beth Rivers  admits to evening cravings. She is not taking metformin currently but she is on Saxenda for satiety and appetite suppression. She continues to work on diet and exercise to decrease risk of diabetes.  Depression with emotional eating behaviors Beth Rivers admits to cravings in the evenings which she gives into to. She has had increased stress recently. Beth Rivers has seen Beth Rivers in the past, but she stopped due to cost. She reports that her self- sabotage has become a real problem for her recently. She is very frustrated with herself.  [name] struggles with emotional eating and using food for comfort to the extent that it is negatively impacting her health. She often snacks when she is not hungry. Beth Rivers sometimes feels she is out of control and then feels guilty that she made poor food choices. She has been working on behavior modification techniques to help reduce her emotional eating and has been somewhat successful. She shows no sign of suicidal or homicidal ideations.  ASSESSMENT AND PLAN:  Essential hypertension - Plan: triamterene-hydrochlorothiazide (MAXZIDE-25) 37.5-25 MG tablet  Insulin resistance  Class 1 obesity with serious comorbidity and body mass index (BMI) of 34.0 to 34.9 in adult, unspecified obesity type  PLAN:  Hypertension We discussed sodium restriction, working on healthy weight loss, and a regular exercise program as the means to achieve improved blood pressure control. Beth Rivers agreed with this plan and agreed to follow up as directed. We will continue to monitor her blood pressure as well as her progress with the above lifestyle modifications.  Beth Rivers agrees to continue Maxzide 37.5-25 mg daily #30 with no refills and she will watch for signs of hypotension as she continues her lifestyle modifications.  Insulin Resistance Beth Rivers will continue to work on weight loss, exercise, and decreasing simple carbohydrates in her diet to help decrease the risk of diabetes. She was informed that  eating too many simple carbohydrates or too many calories at one sitting increases the likelihood of GI side effects. Beth Rivers will continue with the meal plan and follow up with [place] as directed to monitor her progress.  Depression with Emotional Eating Behaviors We discussed behavior modification techniques today to help Beth Rivers deal with her emotional eating and depression. Beth Rivers will make an appointment with Beth Rivers and follow up as directed.  Obesity Beth Rivers is currently in the action stage of change. As such, her goal is to continue with weight loss efforts She has agreed to follow a lower carbohydrate, vegetable and lean protein rich diet plan Beth Rivers will continue walking 60 to 90 minutes, 3 times per week for weight loss and overall health benefits. We discussed the following Behavioral Modification Strategies today: planning for success, decreasing simple carbohydrates and emotional eating strategies Beth Rivers agrees to increase Saxenda to 1.8 mg daily.  Beth Rivers has agreed to follow up with our clinic in 3 weeks. She was informed of the importance of frequent follow up visits to maximize her success with intensive lifestyle modifications for her multiple health conditions.  ALLERGIES: No Known Allergies  MEDICATIONS: Current Outpatient Medications on File Prior to Visit  Medication Sig Dispense Refill  . cholecalciferol (VITAMIN D3) 25 MCG (1000 UT) tablet Take 2,000 Units by mouth daily.    . Insulin Pen Needle (BD PEN NEEDLE NANO U/F) 32G X 4 MM MISC 1 Device by Does not apply route daily. 100 each 0  . Liraglutide -Weight Management (SAXENDA) 18 MG/3ML SOPN Inject 3 mg into the skin daily. 5 pen 0   No current facility-administered medications on file prior to visit.     PAST MEDICAL HISTORY: Past Medical History:  Diagnosis Date  . Anxiety   . Depression 09/2001  . Elevated BP   . Elevated LDL cholesterol level 1/14  . Feeling lonely   . History of uterine fibroid 11/2002  . HTN  (hypertension)   . Joint pain   . Leg edema   . Obesity   . Obstructive sleep apnea     PAST SURGICAL HISTORY: History reviewed. No pertinent surgical history.  SOCIAL HISTORY: Social History   Tobacco Use  . Smoking status: Never Smoker  . Smokeless tobacco: Never Used  Substance Use Topics  . Alcohol use: Yes    Alcohol/week: 1.0 standard drinks    Types: 1 Glasses of wine per week  . Drug use: No    FAMILY HISTORY: Family History  Problem Relation Age of Onset  . Hypertension Mother   . Hyperlipidemia Mother   . Depression Mother   . Obesity Mother   . Hypertension Father   . Heart attack Father   . Obesity Father   . Hyperlipidemia Father   . Heart disease Father   . Sudden death Daughter   . Colon cancer Maternal Grandmother     ROS: Review of Systems  Constitutional: Negative for weight loss.  Respiratory: Negative for shortness of breath (on exertion).   Cardiovascular: Negative for chest pain.  Endo/Heme/Allergies:       Positive for polyphagia Positive for cravings  Psychiatric/Behavioral: Positive for depression. Negative  for suicidal ideas.       Positive for Stress    PHYSICAL EXAM: Pt in no acute distress  RECENT LABS AND TESTS: BMET    Component Value Date/Time   NA 143 04/04/2019 0907   K 4.1 04/04/2019 0907   K 3.9 02/26/2016 1522   CL 105 04/04/2019 0907   CL 103 02/26/2016 1522   CO2 25 04/04/2019 0907   CO2 26 02/26/2016 1522   GLUCOSE 82 04/04/2019 0907   GLUCOSE 99 09/27/2017 1549   BUN 28 (H) 04/04/2019 0907   CREATININE 0.63 04/04/2019 0907   CREATININE 0.64 02/26/2016 1522   CREATININE 0.84 01/28/2016 1035   CALCIUM 9.2 04/04/2019 0907   CALCIUM 8.8 02/26/2016 1522   GFRNONAA 96 04/04/2019 0907   GFRAA 111 04/04/2019 0907   Lab Results  Component Value Date   HGBA1C 5.3 04/04/2019   HGBA1C 5.2 11/03/2018   HGBA1C 5.2 07/11/2018   HGBA1C 5.3 09/27/2017   Lab Results  Component Value Date   INSULIN 10.2  04/04/2019   INSULIN 10.6 11/03/2018   INSULIN 21.0 07/11/2018   CBC    Component Value Date/Time   WBC 6.2 07/11/2018 1404   WBC 9.9 09/27/2017 1549   RBC 4.56 07/11/2018 1404   RBC 4.90 09/27/2017 1549   HGB 14.3 07/11/2018 1404   HGB 15.9 02/26/2016 1522   HCT 42.8 07/11/2018 1404   HCT 45.5 02/26/2016 1522   PLT 253.0 09/27/2017 1549   PLT 236 02/26/2016 1522   MCV 94 07/11/2018 1404   MCV 93 02/26/2016 1522   MCH 31.4 07/11/2018 1404   MCH 32.6 02/26/2016 1522   MCH 32.4 01/28/2016 1035   MCHC 33.4 07/11/2018 1404   MCHC 34.3 09/27/2017 1549   RDW 13.7 07/11/2018 1404   RDW 13.6 02/26/2016 1522   LYMPHSABS 1.7 07/11/2018 1404   LYMPHSABS 1.7 02/26/2016 1522   EOSABS 0.1 07/11/2018 1404   EOSABS 0.2 02/26/2016 1522   BASOSABS 0.1 07/11/2018 1404   BASOSABS 0.0 02/26/2016 1522   Iron/TIBC/Ferritin/ %Sat    Component Value Date/Time   IRON 65 02/26/2016 1521   TIBC 280 02/26/2016 1521   FERRITIN 90 02/26/2016 1521   IRONPCTSAT 23 02/26/2016 1521   Lipid Panel     Component Value Date/Time   CHOL 210 (H) 04/04/2019 0907   TRIG 80 04/04/2019 0907   HDL 48 04/04/2019 0907   CHOLHDL 5 09/27/2017 1549   VLDL 36.4 09/27/2017 1549   LDLCALC 146 (H) 04/04/2019 0907   Hepatic Function Panel     Component Value Date/Time   PROT 6.8 04/04/2019 0907   ALBUMIN 4.1 04/04/2019 0907   ALBUMIN 3.9 02/26/2016 1522   AST 17 04/04/2019 0907   AST 19 02/26/2016 1522   ALT 18 04/04/2019 0907   ALT 21 02/26/2016 1522   ALKPHOS 63 04/04/2019 0907   ALKPHOS 75 02/26/2016 1522   BILITOT 0.4 04/04/2019 0907      Component Value Date/Time   TSH 2.260 07/11/2018 1404   TSH 1.25 09/27/2017 1549   TSH 0.94 11/20/2015 1012     Ref. Range 04/04/2019 09:07  Vitamin D, 25-Hydroxy Latest Ref Range: 30.0 - 100.0 ng/mL 50.1    I, Doreene Nest, am acting as Location manager for [employer], FNP-C.  I have reviewed the above documentation for accuracy and completeness, and I  agree with the above.  - Dawn Whitmire, FNP-C.

## 2019-07-17 ENCOUNTER — Encounter (INDEPENDENT_AMBULATORY_CARE_PROVIDER_SITE_OTHER): Payer: Self-pay | Admitting: Family Medicine

## 2019-07-20 DIAGNOSIS — H02831 Dermatochalasis of right upper eyelid: Secondary | ICD-10-CM | POA: Diagnosis not present

## 2019-07-20 DIAGNOSIS — H02834 Dermatochalasis of left upper eyelid: Secondary | ICD-10-CM | POA: Diagnosis not present

## 2019-07-20 DIAGNOSIS — H53453 Other localized visual field defect, bilateral: Secondary | ICD-10-CM | POA: Diagnosis not present

## 2019-07-26 DIAGNOSIS — G4733 Obstructive sleep apnea (adult) (pediatric): Secondary | ICD-10-CM | POA: Diagnosis not present

## 2019-07-29 DIAGNOSIS — Z23 Encounter for immunization: Secondary | ICD-10-CM | POA: Diagnosis not present

## 2019-08-01 ENCOUNTER — Ambulatory Visit (INDEPENDENT_AMBULATORY_CARE_PROVIDER_SITE_OTHER): Payer: BC Managed Care – PPO | Admitting: Family Medicine

## 2019-08-01 ENCOUNTER — Encounter (INDEPENDENT_AMBULATORY_CARE_PROVIDER_SITE_OTHER): Payer: Self-pay | Admitting: Family Medicine

## 2019-08-01 ENCOUNTER — Other Ambulatory Visit: Payer: Self-pay

## 2019-08-01 VITALS — BP 106/68 | HR 78 | Temp 98.2°F | Ht 63.0 in | Wt 195.0 lb

## 2019-08-01 DIAGNOSIS — Z6834 Body mass index (BMI) 34.0-34.9, adult: Secondary | ICD-10-CM

## 2019-08-01 DIAGNOSIS — Z9189 Other specified personal risk factors, not elsewhere classified: Secondary | ICD-10-CM

## 2019-08-01 DIAGNOSIS — I10 Essential (primary) hypertension: Secondary | ICD-10-CM

## 2019-08-01 DIAGNOSIS — E7849 Other hyperlipidemia: Secondary | ICD-10-CM

## 2019-08-01 DIAGNOSIS — E559 Vitamin D deficiency, unspecified: Secondary | ICD-10-CM | POA: Diagnosis not present

## 2019-08-01 DIAGNOSIS — E669 Obesity, unspecified: Secondary | ICD-10-CM

## 2019-08-01 DIAGNOSIS — E8881 Metabolic syndrome: Secondary | ICD-10-CM | POA: Diagnosis not present

## 2019-08-01 MED ORDER — TRIAMTERENE-HCTZ 37.5-25 MG PO TABS: 1.0000 | ORAL_TABLET | Freq: Every day | ORAL | 0 refills | Status: DC

## 2019-08-01 MED ORDER — SAXENDA 18 MG/3ML ~~LOC~~ SOPN: 3.0000 mg | PEN_INJECTOR | Freq: Every day | SUBCUTANEOUS | 0 refills | Status: DC

## 2019-08-01 MED ORDER — BD PEN NEEDLE NANO U/F 32G X 4 MM MISC: 1.0000 | Freq: Every day | 0 refills | Status: DC

## 2019-08-01 NOTE — Progress Notes (Signed)
Office: [phone]  /  Fax: [phone]   HPI:   Chief Complaint: OBESITY Beth Rivers is here to discuss her progress with her obesity treatment plan. She is on the Category 3 plan and is following her eating plan approximately 100 % of the time. She states she is exercising 0 minutes 0 times per week. Beth Rivers was off the plan after she had a recent blepharoplasty but she has been back on the plan solidly for the last several days. She is on Saxenda 1.8 mg daily and feels it suppresses her appetite well. Beth Rivers denies constipation or nausea. Her weight is 195 lb (88.5 kg) today and has had a weight loss of 1 pound since her last in-office visit. She has lost 25 lbs since starting treatment with [place].  Vitamin D deficiency Beth Rivers has a diagnosis of vitamin D deficiency. Her last vitamin D level was at goal. She is on OTC vit D 2,000 IU daily and she denies nausea, vomiting or muscle weakness.  Hyperlipidemia Beth Rivers has hyperlipidemia and she is not on statin. She has been trying to improve her cholesterol levels with intensive lifestyle modification including a low saturated fat diet, exercise and weight loss. She denies chest pain or shortness of breath. Her last LDL was 146 and her HDL was slightly low at 48. Her triglycerides were normal. The 10-year ASCVD risk score [name] DC Jr., et al., 2013) is: 4.6%   Values used to calculate the score:     Age: 55 years     Sex: Female     Is Non-Hispanic African American: No     Diabetic: No     Tobacco smoker: No     Systolic Blood Pressure: [id number] mmHg     Is BP treated: Yes     HDL Cholesterol: 45 mg/dL     Total Cholesterol: 225 mg/dL   Insulin Resistance Beth Rivers has a diagnosis of insulin resistance based on her elevated fasting insulin level >5. Although Beth Rivers's blood glucose readings are still under good control, insulin resistance puts her at greater risk of metabolic syndrome and diabetes. Beth Rivers is not on metformin and she continues to work on diet  and exercise to decrease risk of diabetes. Beth Rivers denies polyphagia. Lab Results  Component Value Date   HGBA1C 5.1 08/01/2019    Hypertension Beth Rivers is a 63 y.o. female with hypertension. [name] blood pressure is well controlled on Maxzide. [name] denies chest pain or shortness of breath on exertion. She is working weight loss to help control her blood pressure with the goal of decreasing her risk of heart attack and stroke.  BP Readings from Last 3 Encounters:  08/01/19 106/68  06/19/19 119/69  04/19/19 118/75     At risk for cardiovascular disease Beth Rivers is at a higher than average risk for cardiovascular disease due to obesity, hyperlipidemia, insulin resistance and hypertension. She currently denies any chest pain.  ASSESSMENT AND PLAN:  Essential hypertension - Plan: triamterene-hydrochlorothiazide (MAXZIDE-25) 37.5-25 MG tablet  Other hyperlipidemia - Plan: Lipid Panel With LDL/HDL Ratio  Insulin resistance - Plan: Comprehensive Metabolic Panel (CMET), HgB A1c, Insulin, random  Vitamin D deficiency - Plan: Vitamin D (25 hydroxy)  At risk for heart disease  Class 1 obesity with serious comorbidity and body mass index (BMI) of 34.0 to 34.9 in adult, unspecified obesity type - Plan: Liraglutide -Weight Management (SAXENDA) 18 MG/3ML SOPN, Insulin Pen Needle (BD PEN NEEDLE NANO U/F) 32G X 4 MM MISC  PLAN:  Vitamin D  Deficiency Beth Rivers was informed that low vitamin D levels contributes to fatigue and are associated with obesity, breast, and colon cancer. Beth Rivers will continue OTC vitamin D and she will follow up for routine testing of vitamin D, at least 2-3 times per year. She was informed of the risk of over-replacement of vitamin D and agrees to not increase her dose unless she discusses this with [place] first. We will check vitamin D today and Hiley will follow up as directed.  Hyperlipidemia Beth Rivers was informed of the American Heart Association Guidelines emphasizing  intensive lifestyle modifications as the first line treatment for hyperlipidemia. We discussed many lifestyle modifications today in depth, and Beth Rivers will continue to work on decreasing saturated fats such as fatty red meat, butter and many fried foods. She will also increase vegetables and lean protein in her diet and continue to work on exercise and weight loss efforts. We will check fasting lipid panel today.  Insulin Resistance Beth Rivers will continue to work on weight loss, exercise, and decreasing simple carbohydrates in her diet to help decrease the risk of diabetes.  We will check fasting glucose, insulin and A1c today and Beth Rivers agreed to follow up with [place] as directed to monitor her progress.  Hypertension We discussed sodium restriction, working on healthy weight loss, and a regular exercise program as the means to achieve improved blood pressure control. Beth Rivers agreed with this plan and agreed to follow up as directed. We will continue to monitor her blood pressure as well as her progress with the above lifestyle modifications. Beth Rivers agrees to continue Maxzide 37.5-25 mg daily #30 with no refills and she will watch for signs of hypotension as she continues her lifestyle modifications.  Cardiovascular risk counseling Beth Rivers was given extended (15 minutes) coronary artery disease prevention counseling today. She is 63 y.o. female and has risk factors for heart disease including obesity, hyperlipidemia, insulin resistance and hypertension. We discussed intensive lifestyle modifications today with an emphasis on specific weight loss instructions and strategies. Pt was also informed of the importance of increasing exercise and decreasing saturated fats to help prevent heart disease.  Obesity Beth Rivers is currently in the action stage of change. As such, her goal is to continue with weight loss efforts She has agreed to follow a lower carbohydrate, vegetable and lean protein rich diet plan Beth Rivers has been  instructed to work up to a goal of 150 minutes of combined cardio and strengthening exercise per week for weight loss and overall health benefits. We discussed the following Behavioral Modification Strategies today: planning for success and increasing lean protein intake  Lavilla agrees to stay on 1.8 mg daily of Saxenda.  Brittian has agreed to follow up with our clinic in 3 weeks. She was informed of the importance of frequent follow up visits to maximize her success with intensive lifestyle modifications for her multiple health conditions.  ALLERGIES: No Known Allergies  MEDICATIONS: Current Outpatient Medications on File Prior to Visit  Medication Sig Dispense Refill   cholecalciferol (VITAMIN D3) 25 MCG (1000 UT) tablet Take 2,000 Units by mouth daily.     No current facility-administered medications on file prior to visit.     PAST MEDICAL HISTORY: Past Medical History:  Diagnosis Date   Anxiety    Depression 09/2001   Elevated BP    Elevated LDL cholesterol level 1/14   Feeling lonely    History of uterine fibroid 11/2002   HTN (hypertension)    Joint pain    Leg edema  Obesity    Obstructive sleep apnea     PAST SURGICAL HISTORY: History reviewed. No pertinent surgical history.  SOCIAL HISTORY: Social History   Tobacco Use   Smoking status: Never Smoker   Smokeless tobacco: Never Used  Substance Use Topics   Alcohol use: Yes    Alcohol/week: 1.0 standard drinks    Types: 1 Glasses of wine per week   Drug use: No    FAMILY HISTORY: Family History  Problem Relation Age of Onset   Hypertension Mother    Hyperlipidemia Mother    Depression Mother    Obesity Mother    Hypertension Father    Heart attack Father    Obesity Father    Hyperlipidemia Father    Heart disease Father    Sudden death Daughter    Colon cancer Maternal Grandmother     ROS: Review of Systems  Constitutional: Positive for weight loss.  Respiratory:  Negative for shortness of breath (on exertion).   Cardiovascular: Negative for chest pain.  Gastrointestinal: Negative for constipation, nausea and vomiting.  Musculoskeletal:       Negative for muscle weakness  Endo/Heme/Allergies:       Negative for polyphagia    PHYSICAL EXAM: Blood pressure 106/68, pulse 78, temperature 98.2 F (36.8 C), temperature source Oral, height 5\' 3"  (1.6 m), weight 195 lb (88.5 kg), last menstrual period 09/22/2003, SpO2 97 %. Body mass index is 34.54 kg/m. Physical Exam Vitals signs reviewed.  Constitutional:      Appearance: Normal appearance. She is well-developed. She is obese.  Cardiovascular:     Rate and Rhythm: Normal rate.  Pulmonary:     Effort: Pulmonary effort is normal.  Musculoskeletal: Normal range of motion.  Skin:    General: Skin is warm and dry.  Neurological:     Mental Status: She is alert and oriented to person, place, and time.  Psychiatric:        Mood and Affect: Mood normal.        Behavior: Behavior normal.     RECENT LABS AND TESTS: BMET    Component Value Date/Time   NA 143 04/04/2019 0907   K 4.1 04/04/2019 0907   K 3.9 02/26/2016 1522   CL 105 04/04/2019 0907   CL 103 02/26/2016 1522   CO2 25 04/04/2019 0907   CO2 26 02/26/2016 1522   GLUCOSE 82 04/04/2019 0907   GLUCOSE 99 09/27/2017 1549   BUN 28 (H) 04/04/2019 0907   CREATININE 0.63 04/04/2019 0907   CREATININE 0.64 02/26/2016 1522   CREATININE 0.84 01/28/2016 1035   CALCIUM 9.2 04/04/2019 0907   CALCIUM 8.8 02/26/2016 1522   GFRNONAA 96 04/04/2019 0907   GFRAA 111 04/04/2019 0907   Lab Results  Component Value Date   HGBA1C 5.3 04/04/2019   HGBA1C 5.2 11/03/2018   HGBA1C 5.2 07/11/2018   HGBA1C 5.3 09/27/2017   Lab Results  Component Value Date   INSULIN 10.2 04/04/2019   INSULIN 10.6 11/03/2018   INSULIN 21.0 07/11/2018   CBC    Component Value Date/Time   WBC 6.2 07/11/2018 1404   WBC 9.9 09/27/2017 1549   RBC 4.56 07/11/2018  1404   RBC 4.90 09/27/2017 1549   HGB 14.3 07/11/2018 1404   HGB 15.9 02/26/2016 1522   HCT 42.8 07/11/2018 1404   HCT 45.5 02/26/2016 1522   PLT 253.0 09/27/2017 1549   PLT 236 02/26/2016 1522   MCV 94 07/11/2018 1404   MCV 93 02/26/2016 1522  MCH 31.4 07/11/2018 1404   MCH 32.6 02/26/2016 1522   MCH 32.4 01/28/2016 1035   MCHC 33.4 07/11/2018 1404   MCHC 34.3 09/27/2017 1549   RDW 13.7 07/11/2018 1404   RDW 13.6 02/26/2016 1522   LYMPHSABS 1.7 07/11/2018 1404   LYMPHSABS 1.7 02/26/2016 1522   EOSABS 0.1 07/11/2018 1404   EOSABS 0.2 02/26/2016 1522   BASOSABS 0.1 07/11/2018 1404   BASOSABS 0.0 02/26/2016 1522   Iron/TIBC/Ferritin/ %Sat    Component Value Date/Time   IRON 65 02/26/2016 1521   TIBC 280 02/26/2016 1521   FERRITIN 90 02/26/2016 1521   IRONPCTSAT 23 02/26/2016 1521   Lipid Panel     Component Value Date/Time   CHOL 210 (H) 04/04/2019 0907   TRIG 80 04/04/2019 0907   HDL 48 04/04/2019 0907   CHOLHDL 5 09/27/2017 1549   VLDL 36.4 09/27/2017 1549   LDLCALC 146 (H) 04/04/2019 0907   Hepatic Function Panel     Component Value Date/Time   PROT 6.8 04/04/2019 0907   ALBUMIN 4.1 04/04/2019 0907   ALBUMIN 3.9 02/26/2016 1522   AST 17 04/04/2019 0907   AST 19 02/26/2016 1522   ALT 18 04/04/2019 0907   ALT 21 02/26/2016 1522   ALKPHOS 63 04/04/2019 0907   ALKPHOS 75 02/26/2016 1522   BILITOT 0.4 04/04/2019 0907      Component Value Date/Time   TSH 2.260 07/11/2018 1404   TSH 1.25 09/27/2017 1549   TSH 0.94 11/20/2015 1012     Ref. Range 04/04/2019 09:07  Vitamin D, 25-Hydroxy Latest Ref Range: 30.0 - 100.0 ng/mL 50.1    OBESITY BEHAVIORAL INTERVENTION VISIT  Today's visit was # 20   Starting weight: 220 lbs Starting date: 07/11/2018 Today's weight : 195 lbs Today's date: 08/01/2019 Total lbs lost to date: 25    08/01/2019  Height 5\' 3"  (1.6 m)  Weight 195 lb (88.5 kg)  BMI (Calculated) 34.55  BLOOD PRESSURE - SYSTOLIC [id number]  BLOOD  PRESSURE - DIASTOLIC 68   Body Fat % [id number] %  Total Body Water (lbs) 71.21 lbs    ASK: We discussed the diagnosis of obesity with Neville Route today and Kennya agreed to give [place] permission to discuss obesity behavioral modification therapy today.  ASSESS: Raengel has the diagnosis of obesity and her BMI today is 34.55 Alexanne is in the action stage of change   ADVISE: Amsi was educated on the multiple health risks of obesity as well as the benefit of weight loss to improve her health. She was advised of the need for long term treatment and the importance of lifestyle modifications to improve her current health and to decrease her risk of future health problems.  AGREE: Multiple dietary modification options and treatment options were discussed and  Vaniya agreed to follow the recommendations documented in the above note.  ARRANGE: Jamere was educated on the importance of frequent visits to treat obesity as outlined per CMS and USPSTF guidelines and agreed to schedule her next follow up appointment today.  I, Doreene Nest, am acting as transcriptionist for [employer], FNP-C  I have reviewed the above documentation for accuracy and completeness, and I agree with the above.  - Lillyana Majette, FNP-C.

## 2019-08-02 LAB — COMPREHENSIVE METABOLIC PANEL
ALT: 31 IU/L (ref 0–32)
AST: 23 IU/L (ref 0–40)
Albumin/Globulin Ratio: 1.8 (ref 1.2–2.2)
Albumin: 4.3 g/dL (ref 3.8–4.8)
Alkaline Phosphatase: 67 IU/L (ref 39–117)
BUN/Creatinine Ratio: 40 — ABNORMAL HIGH (ref 12–28)
BUN: 25 mg/dL (ref 8–27)
Bilirubin Total: 0.4 mg/dL (ref 0.0–1.2)
CO2: 25 mmol/L (ref 20–29)
Calcium: 9.5 mg/dL (ref 8.7–10.3)
Chloride: 101 mmol/L (ref 96–106)
Creatinine, Ser: 0.63 mg/dL (ref 0.57–1.00)
GFR calc Af Amer: 111 mL/min/{1.73_m2} (ref >59)
GFR calc non Af Amer: 96 mL/min/{1.73_m2} (ref >59)
Globulin, Total: 2.4 g/dL (ref 1.5–4.5)
Glucose: 90 mg/dL (ref 65–99)
Potassium: 4.3 mmol/L (ref 3.5–5.2)
Sodium: 142 mmol/L (ref 134–144)
Total Protein: 6.7 g/dL (ref 6.0–8.5)

## 2019-08-02 LAB — LIPID PANEL WITH LDL/HDL RATIO
Cholesterol, Total: 225 mg/dL — ABNORMAL HIGH (ref 100–199)
HDL: 45 mg/dL (ref >39)
LDL Chol Calc (NIH): 158 mg/dL — ABNORMAL HIGH (ref 0–99)
LDL/HDL Ratio: 3.5 ratio — ABNORMAL HIGH (ref 0.0–3.2)
Triglycerides: 121 mg/dL (ref 0–149)
VLDL Cholesterol Cal: 22 mg/dL (ref 5–40)

## 2019-08-02 LAB — HEMOGLOBIN A1C
Est. average glucose Bld gHb Est-mCnc: 100 mg/dL
Hgb A1c MFr Bld: 5.1 % (ref 4.8–5.6)

## 2019-08-02 LAB — VITAMIN D 25 HYDROXY (VIT D DEFICIENCY, FRACTURES): Vit D, 25-Hydroxy: 55.6 ng/mL (ref 30.0–100.0)

## 2019-08-02 LAB — INSULIN, RANDOM: INSULIN: 14.8 u[IU]/mL (ref 2.6–24.9)

## 2019-08-08 ENCOUNTER — Encounter (INDEPENDENT_AMBULATORY_CARE_PROVIDER_SITE_OTHER): Payer: Self-pay | Admitting: Family Medicine

## 2019-08-08 DIAGNOSIS — E7849 Other hyperlipidemia: Secondary | ICD-10-CM | POA: Insufficient documentation

## 2019-08-23 DIAGNOSIS — G4733 Obstructive sleep apnea (adult) (pediatric): Secondary | ICD-10-CM | POA: Diagnosis not present

## 2019-08-24 ENCOUNTER — Telehealth (INDEPENDENT_AMBULATORY_CARE_PROVIDER_SITE_OTHER): Payer: BC Managed Care – PPO | Admitting: Family Medicine

## 2019-08-24 ENCOUNTER — Encounter (INDEPENDENT_AMBULATORY_CARE_PROVIDER_SITE_OTHER): Payer: Self-pay | Admitting: Family Medicine

## 2019-08-24 ENCOUNTER — Other Ambulatory Visit: Payer: Self-pay

## 2019-08-24 DIAGNOSIS — Z6834 Body mass index (BMI) 34.0-34.9, adult: Secondary | ICD-10-CM | POA: Diagnosis not present

## 2019-08-24 DIAGNOSIS — E559 Vitamin D deficiency, unspecified: Secondary | ICD-10-CM

## 2019-08-24 DIAGNOSIS — E669 Obesity, unspecified: Secondary | ICD-10-CM | POA: Diagnosis not present

## 2019-08-24 DIAGNOSIS — F3289 Other specified depressive episodes: Secondary | ICD-10-CM

## 2019-08-24 MED ORDER — SERTRALINE HCL 50 MG PO TABS: 50.0000 mg | ORAL_TABLET | Freq: Every day | ORAL | 0 refills | Status: DC

## 2019-08-28 NOTE — Progress Notes (Signed)
Office: [phone]  /  Fax: [phone] TeleHealth Visit:  Beth Rivers has verbally consented to this TeleHealth visit today. The patient is located at home, the provider is located at the [employer] [facility] office. The participants in this visit include the listed provider and patient and any and all parties involved. The visit was conducted today via telephone. Beth Rivers was unable to use realtime audiovisual technology today and the telehealth visit was conducted via telephone (time spent on call 25 minutes, 7:44-8:09).  HPI:   Chief Complaint: OBESITY Beth Rivers is here to discuss her progress with her obesity treatment plan. She is on the Category 3 plan and is following her eating plan approximately 80 % of the time. She states she is exercising 0 minutes 0 times per week. Beth Rivers did not start the low carb plan. She has been trying to do the Category 3 plan. She reports being depressed and eating quite a bit of junk food. Since Sunday she has been back on the plan. Beth Rivers is on Saxenda 1.8 mg daily which is providing good appetite supression. We were unable to weigh the patient today for this TeleHealth visit. She feels as if she has maintained weight since her last visit (weight 197 lbs 08/23/19). She has lost 25 lbs since starting treatment with [place].  Vitamin D deficiency Beth Rivers has a diagnosis of vitamin D deficiency. Vitamin D level is at goal at 55.6 on 08/01/19.  Beth Rivers is currently taking OTC vit D 2,000 IU daily. She denies nausea, vomiting or muscle weakness.  Depression with emotional eating behaviors Beth Rivers feels depressed. She is having stress with family and work. Her husband does not live with her and they have a complicated relationship. She would like to leave the marriage but her daughter influences her to stay in the marriage. She had another daughter who committed suicide a few years ago. She feels quite overwhelmed. She reports feeling isolated. [name] reports her depression  is preventing good adherence to the plan. She reports anhedonia. Beth Rivers is struggling with emotional eating and using food for comfort to the extent that it is negatively impacting her health. She often snacks when she is not hungry. Beth Rivers sometimes feels she is out of control and then feels guilty that she made poor food choices. She has been working on behavior modification techniques to help reduce her emotional eating and has been somewhat successful. She shows no sign of suicidal or homicidal ideations.  ASSESSMENT AND PLAN:  Vitamin D deficiency  Other depression,emotional eating - Plan: sertraline (ZOLOFT) 50 MG tablet  Class 1 obesity with serious comorbidity and body mass index (BMI) of 34.0 to 34.9 in adult, unspecified obesity type  PLAN:  Vitamin D Deficiency Low vitamin D level contributes to fatigue and are associated with obesity, breast, and colon cancer. Beth Rivers agrees to continue OTC Vit D @2 ,000 IU daily and she will follow up for routine testing of vitamin D, at least 2-3 times per year. She was informed of the risk of over-replacement of vitamin D.   Emotional Eating Behaviors (other depression) We discussed behavior modification techniques today to help Beth Rivers deal with her emotional eating behaviors. We will refer patient to [name], our bariatric psychologist. She has agreed to start Zoloft 50 mg daily #30 with no refills and follow up as directed.  Obesity Beth Rivers is currently in the action stage of change. As such, her goal is to continue with weight loss efforts She has agreed to follow the Category  3 plan We discussed the following Behavioral Modification Strategies today: planning for success, better snacking choices, increasing lean protein intake and decreasing simple carbohydrates   Beth Rivers will continue Saxenda 1.8 mg daily.  Beth Rivers has agreed to follow up with our clinic in 2 weeks. She was informed of the importance of frequent follow up visits to maximize her  success with intensive lifestyle modifications for her multiple health conditions.  ALLERGIES: No Known Allergies  MEDICATIONS: Current Outpatient Medications on File Prior to Visit  Medication Sig Dispense Refill  . cholecalciferol (VITAMIN D3) 25 MCG (1000 UT) tablet Take 2,000 Units by mouth daily.    . Insulin Pen Needle (BD PEN NEEDLE NANO U/F) 32G X 4 MM MISC 1 Device by Does not apply route daily. 100 each 0  . Liraglutide -Weight Management (SAXENDA) 18 MG/3ML SOPN Inject 3 mg into the skin daily. 5 pen 0  . triamterene-hydrochlorothiazide (MAXZIDE-25) 37.5-25 MG tablet Take 1 tablet by mouth daily. 30 tablet 0   No current facility-administered medications on file prior to visit.     PAST MEDICAL HISTORY: Past Medical History:  Diagnosis Date  . Anxiety   . Depression 09/2001  . Elevated BP   . Elevated LDL cholesterol level 1/14  . Feeling lonely   . History of uterine fibroid 11/2002  . HTN (hypertension)   . Joint pain   . Leg edema   . Obesity   . Obstructive sleep apnea     PAST SURGICAL HISTORY: History reviewed. No pertinent surgical history.  SOCIAL HISTORY: Social History   Tobacco Use  . Smoking status: Never Smoker  . Smokeless tobacco: Never Used  Substance Use Topics  . Alcohol use: Yes    Alcohol/week: 1.0 standard drinks    Types: 1 Glasses of wine per week  . Drug use: No    FAMILY HISTORY: Family History  Problem Relation Age of Onset  . Hypertension Mother   . Hyperlipidemia Mother   . Depression Mother   . Obesity Mother   . Hypertension Father   . Heart attack Father   . Obesity Father   . Hyperlipidemia Father   . Heart disease Father   . Sudden death Daughter   . Colon cancer Maternal Grandmother     ROS: Review of Systems  Constitutional: Negative for weight loss.  Gastrointestinal: Negative for nausea and vomiting.  Musculoskeletal:       Negative for muscle weakness  Psychiatric/Behavioral: Positive for depression.  Negative for suicidal ideas.       Positive for stress Positive for anhedonia    PHYSICAL EXAM: Pt in no acute distress  RECENT LABS AND TESTS: BMET    Component Value Date/Time   NA 142 08/01/2019 0812   K 4.3 08/01/2019 0812   K 3.9 02/26/2016 1522   CL 101 08/01/2019 0812   CL 103 02/26/2016 1522   CO2 25 08/01/2019 0812   CO2 26 02/26/2016 1522   GLUCOSE 90 08/01/2019 0812   GLUCOSE 99 09/27/2017 1549   BUN 25 08/01/2019 0812   CREATININE 0.63 08/01/2019 0812   CREATININE 0.64 02/26/2016 1522   CREATININE 0.84 01/28/2016 1035   CALCIUM 9.5 08/01/2019 0812   CALCIUM 8.8 02/26/2016 1522   GFRNONAA 96 08/01/2019 0812   GFRAA 111 08/01/2019 0812   Lab Results  Component Value Date   HGBA1C 5.1 08/01/2019   HGBA1C 5.3 04/04/2019   HGBA1C 5.2 11/03/2018   HGBA1C 5.2 07/11/2018   HGBA1C 5.3 09/27/2017  Lab Results  Component Value Date   INSULIN 14.8 08/01/2019   INSULIN 10.2 04/04/2019   INSULIN 10.6 11/03/2018   INSULIN 21.0 07/11/2018   CBC    Component Value Date/Time   WBC 6.2 07/11/2018 1404   WBC 9.9 09/27/2017 1549   RBC 4.56 07/11/2018 1404   RBC 4.90 09/27/2017 1549   HGB 14.3 07/11/2018 1404   HGB 15.9 02/26/2016 1522   HCT 42.8 07/11/2018 1404   HCT 45.5 02/26/2016 1522   PLT 253.0 09/27/2017 1549   PLT 236 02/26/2016 1522   MCV 94 07/11/2018 1404   MCV 93 02/26/2016 1522   MCH 31.4 07/11/2018 1404   MCH 32.6 02/26/2016 1522   MCH 32.4 01/28/2016 1035   MCHC 33.4 07/11/2018 1404   MCHC 34.3 09/27/2017 1549   RDW 13.7 07/11/2018 1404   RDW 13.6 02/26/2016 1522   LYMPHSABS 1.7 07/11/2018 1404   LYMPHSABS 1.7 02/26/2016 1522   EOSABS 0.1 07/11/2018 1404   EOSABS 0.2 02/26/2016 1522   BASOSABS 0.1 07/11/2018 1404   BASOSABS 0.0 02/26/2016 1522   Iron/TIBC/Ferritin/ %Sat    Component Value Date/Time   IRON 65 02/26/2016 1521   TIBC 280 02/26/2016 1521   FERRITIN 90 02/26/2016 1521   IRONPCTSAT 23 02/26/2016 1521   Lipid Panel      Component Value Date/Time   CHOL 225 (H) 08/01/2019 0812   TRIG 121 08/01/2019 0812   HDL 45 08/01/2019 0812   CHOLHDL 5 09/27/2017 1549   VLDL 36.4 09/27/2017 1549   LDLCALC 158 (H) 08/01/2019 0812   Hepatic Function Panel     Component Value Date/Time   PROT 6.7 08/01/2019 0812   ALBUMIN 4.3 08/01/2019 0812   ALBUMIN 3.9 02/26/2016 1522   AST 23 08/01/2019 0812   AST 19 02/26/2016 1522   ALT 31 08/01/2019 0812   ALT 21 02/26/2016 1522   ALKPHOS 67 08/01/2019 0812   ALKPHOS 75 02/26/2016 1522   BILITOT 0.4 08/01/2019 0812      Component Value Date/Time   TSH 2.260 07/11/2018 1404   TSH 1.25 09/27/2017 1549   TSH 0.94 11/20/2015 1012     Ref. Range 08/01/2019 08:12  Vitamin D, 25-Hydroxy Latest Ref Range: 30.0 - 100.0 ng/mL 55.6    I, Doreene Nest, am acting as Location manager for [employer], FNP-C.  I have reviewed the above documentation for accuracy and completeness, and I agree with the above.  - Maquita Sandoval, FNP-C.

## 2019-08-31 ENCOUNTER — Encounter (INDEPENDENT_AMBULATORY_CARE_PROVIDER_SITE_OTHER): Payer: Self-pay | Admitting: Family Medicine

## 2019-08-31 ENCOUNTER — Other Ambulatory Visit (INDEPENDENT_AMBULATORY_CARE_PROVIDER_SITE_OTHER): Payer: Self-pay | Admitting: Family Medicine

## 2019-08-31 DIAGNOSIS — I10 Essential (primary) hypertension: Secondary | ICD-10-CM

## 2019-08-31 MED ORDER — TRIAMTERENE-HCTZ 37.5-25 MG PO TABS: 1.0000 | ORAL_TABLET | Freq: Every day | ORAL | 0 refills | Status: DC

## 2019-09-07 ENCOUNTER — Telehealth (INDEPENDENT_AMBULATORY_CARE_PROVIDER_SITE_OTHER): Payer: BC Managed Care – PPO | Admitting: Family Medicine

## 2019-09-07 ENCOUNTER — Other Ambulatory Visit: Payer: Self-pay

## 2019-09-07 ENCOUNTER — Encounter (INDEPENDENT_AMBULATORY_CARE_PROVIDER_SITE_OTHER): Payer: Self-pay | Admitting: Family Medicine

## 2019-09-07 DIAGNOSIS — F3289 Other specified depressive episodes: Secondary | ICD-10-CM

## 2019-09-07 DIAGNOSIS — I10 Essential (primary) hypertension: Secondary | ICD-10-CM

## 2019-09-07 DIAGNOSIS — Z6834 Body mass index (BMI) 34.0-34.9, adult: Secondary | ICD-10-CM | POA: Diagnosis not present

## 2019-09-07 DIAGNOSIS — E669 Obesity, unspecified: Secondary | ICD-10-CM | POA: Diagnosis not present

## 2019-09-07 MED ORDER — SERTRALINE HCL 50 MG PO TABS: 50.0000 mg | ORAL_TABLET | Freq: Every day | ORAL | 0 refills | Status: DC

## 2019-09-12 NOTE — Progress Notes (Signed)
Office: [phone]  /  Fax: [phone] TeleHealth Visit:  Beth Rivers has verbally consented to this TeleHealth visit today. The patient is located at home, the provider is located at the [employer] [facility] office. The participants in this visit include the listed provider and patient and any and all parties involved. The visit was conducted today via telephone. Beth Rivers was unable to use realtime audiovisual technology today and the telehealth visit was conducted via telephone (time spent on call 25 minutes, 1510 to 1535).  HPI:  Chief Complaint: OBESITY Beth Rivers is here to discuss her progress with her obesity treatment plan. She is on the Category 3 plan and states she is following her eating plan approximately 60 % of the time. She states she is exercising 0 minutes 0 times per week. (weight not reported)  Beth Rivers is dealing with a great deal of stress and she is also doing some emotional eating. She is on Saxenda 1.8 mg daily and she reports that some days it helps, and some days she feels out of control. Beth Rivers has no nausea, vomiting or constipation.  Hypertension Beth Rivers is a 63 y.o. female with hypertension. Her blood pressure is well controlled on Maxzide.  Beth Rivers denies chest pain or shortness of breath.  BP Readings from Last 3 Encounters:  08/01/19 106/68  06/19/19 119/69  04/19/19 118/75    Other Depression with emotional eating behaviors Beth Rivers struggles with emotional eating. Zoloft was started at the last visit. She reports it is making her feel less sad. She is glad she started it.   ASSESSMENT AND PLAN:  Essential hypertension  Other depression,emotional eating - Plan: sertraline (ZOLOFT) 50 MG tablet  Class 1 obesity with serious comorbidity and body mass index (BMI) of 34.0 to 34.9 in adult, unspecified obesity type  PLAN:  Hypertension Beth Rivers is working on healthy weight loss and exercise to improve blood pressure control. We will watch for signs of  hypotension as she continues her lifestyle modifications. She will continue Maxzide (no prescription refill needed) and follow up as directed.  Emotional Eating Behaviors (other depression) Behavior modification techniques were discussed today to help Beth Rivers deal with her emotional/non-hunger eating behaviors. Beth Rivers agrees to continue Zoloft 50 mg daily #30 with no refills and follow up as directed. We will continue to follow and monitor her progress.  Obesity Beth Rivers is currently in the action stage of change. As such, her goal is to continue with weight loss efforts She has agreed to follow the Category 3 plan We discussed the following Behavioral Modification Strategies today: planning for success, increasing lean protein intake and decreasing simple carbohydrates   Beth Rivers agrees to increase Saxenda to 2.4 mg sub Q daily.  Beth Rivers has agreed to follow up with our clinic in 4 weeks. She was informed of the importance of frequent follow up visits to maximize her success with intensive lifestyle modifications for her multiple health conditions.  ALLERGIES: No Known Allergies  MEDICATIONS: Current Outpatient Medications on File Prior to Visit  Medication Sig Dispense Refill  . cholecalciferol (VITAMIN D3) 25 MCG (1000 UT) tablet Take 2,000 Units by mouth daily.    . Insulin Pen Needle (BD PEN NEEDLE NANO U/F) 32G X 4 MM MISC 1 Device by Does not apply route daily. 100 each 0  . Liraglutide -Weight Management (SAXENDA) 18 MG/3ML SOPN Inject 3 mg into the skin daily. 5 pen 0  . triamterene-hydrochlorothiazide (MAXZIDE-25) 37.5-25 MG tablet Take 1 tablet by mouth daily. 30 tablet 0  No current facility-administered medications on file prior to visit.    PAST MEDICAL HISTORY: Past Medical History:  Diagnosis Date  . Anxiety   . Depression 09/2001  . Elevated BP   . Elevated LDL cholesterol level 1/14  . Feeling lonely   . History of uterine fibroid 11/2002  . HTN (hypertension)   . Joint  pain   . Leg edema   . Obesity   . Obstructive sleep apnea     PAST SURGICAL HISTORY: History reviewed. No pertinent surgical history.  SOCIAL HISTORY: Social History   Tobacco Use  . Smoking status: Never Smoker  . Smokeless tobacco: Never Used  Substance Use Topics  . Alcohol use: Yes    Alcohol/week: 1.0 standard drinks    Types: 1 Glasses of wine per week  . Drug use: No    FAMILY HISTORY: Family History  Problem Relation Age of Onset  . Hypertension Mother   . Hyperlipidemia Mother   . Depression Mother   . Obesity Mother   . Hypertension Father   . Heart attack Father   . Obesity Father   . Hyperlipidemia Father   . Heart disease Father   . Sudden death Daughter   . Colon cancer Maternal Grandmother     ROS: Review of Systems  Constitutional: Negative for weight loss.  Respiratory: Negative for shortness of breath.   Cardiovascular: Negative for chest pain.  Gastrointestinal: Negative for constipation, nausea and vomiting.  Psychiatric/Behavioral: Positive for depression.    PHYSICAL EXAM: Last menstrual period 09/22/2003. There is no height or weight on file to calculate BMI. Physical Exam Vitals reviewed.  Constitutional:      General: She is not in acute distress.    Appearance: Normal appearance. She is well-developed. She is obese.  Cardiovascular:     Rate and Rhythm: Normal rate.  Pulmonary:     Effort: Pulmonary effort is normal.  Musculoskeletal:        General: Normal range of motion.  Skin:    General: Skin is warm and dry.  Neurological:     Mental Status: She is alert and oriented to person, place, and time.  Psychiatric:        Mood and Affect: Mood normal.        Behavior: Behavior normal.     RECENT LABS AND TESTS: BMET    Component Value Date/Time   NA 142 08/01/2019 0812   K 4.3 08/01/2019 0812   K 3.9 02/26/2016 1522   CL 101 08/01/2019 0812   CL 103 02/26/2016 1522   CO2 25 08/01/2019 0812   CO2 26 02/26/2016  1522   GLUCOSE 90 08/01/2019 0812   GLUCOSE 99 09/27/2017 1549   BUN 25 08/01/2019 0812   CREATININE 0.63 08/01/2019 0812   CREATININE 0.64 02/26/2016 1522   CREATININE 0.84 01/28/2016 1035   CALCIUM 9.5 08/01/2019 0812   CALCIUM 8.8 02/26/2016 1522   GFRNONAA 96 08/01/2019 0812   GFRAA 111 08/01/2019 0812   Lab Results  Component Value Date   HGBA1C 5.1 08/01/2019   HGBA1C 5.3 04/04/2019   HGBA1C 5.2 11/03/2018   HGBA1C 5.2 07/11/2018   HGBA1C 5.3 09/27/2017   Lab Results  Component Value Date   INSULIN 14.8 08/01/2019   INSULIN 10.2 04/04/2019   INSULIN 10.6 11/03/2018   INSULIN 21.0 07/11/2018   CBC    Component Value Date/Time   WBC 6.2 07/11/2018 1404   WBC 9.9 09/27/2017 1549   RBC 4.56 07/11/2018  1404   RBC 4.90 09/27/2017 1549   HGB 14.3 07/11/2018 1404   HGB 15.9 02/26/2016 1522   HCT 42.8 07/11/2018 1404   HCT 45.5 02/26/2016 1522   PLT 253.0 09/27/2017 1549   PLT 236 02/26/2016 1522   MCV 94 07/11/2018 1404   MCV 93 02/26/2016 1522   MCH 31.4 07/11/2018 1404   MCH 32.6 02/26/2016 1522   MCH 32.4 01/28/2016 1035   MCHC 33.4 07/11/2018 1404   MCHC 34.3 09/27/2017 1549   RDW 13.7 07/11/2018 1404   RDW 13.6 02/26/2016 1522   LYMPHSABS 1.7 07/11/2018 1404   LYMPHSABS 1.7 02/26/2016 1522   EOSABS 0.1 07/11/2018 1404   EOSABS 0.2 02/26/2016 1522   BASOSABS 0.1 07/11/2018 1404   BASOSABS 0.0 02/26/2016 1522   Iron/TIBC/Ferritin/ %Sat    Component Value Date/Time   IRON 65 02/26/2016 1521   TIBC 280 02/26/2016 1521   FERRITIN 90 02/26/2016 1521   IRONPCTSAT 23 02/26/2016 1521   Lipid Panel     Component Value Date/Time   CHOL 225 (H) 08/01/2019 0812   TRIG 121 08/01/2019 0812   HDL 45 08/01/2019 0812   CHOLHDL 5 09/27/2017 1549   VLDL 36.4 09/27/2017 1549   LDLCALC 158 (H) 08/01/2019 0812   Hepatic Function Panel     Component Value Date/Time   PROT 6.7 08/01/2019 0812   ALBUMIN 4.3 08/01/2019 0812   ALBUMIN 3.9 02/26/2016 1522   AST  23 08/01/2019 0812   AST 19 02/26/2016 1522   ALT 31 08/01/2019 0812   ALT 21 02/26/2016 1522   ALKPHOS 67 08/01/2019 0812   ALKPHOS 75 02/26/2016 1522   BILITOT 0.4 08/01/2019 0812      Component Value Date/Time   TSH 2.260 07/11/2018 1404   TSH 1.25 09/27/2017 1549   TSH 0.94 11/20/2015 1012     Ref. Range 08/01/2019 08:12  Vitamin D, 25-Hydroxy Latest Ref Range: 30.0 - 100.0 ng/mL 55.6    I, Doreene Nest, am acting as Location manager for [employer], FNP-C  I have reviewed the above documentation for accuracy and completeness, and I agree with the above.  - Horris Speros, FNP-C.

## 2019-09-25 ENCOUNTER — Encounter (INDEPENDENT_AMBULATORY_CARE_PROVIDER_SITE_OTHER): Payer: Self-pay | Admitting: Family Medicine

## 2019-09-25 NOTE — Telephone Encounter (Signed)
See message below °

## 2019-09-27 ENCOUNTER — Other Ambulatory Visit: Payer: Self-pay

## 2019-09-29 ENCOUNTER — Other Ambulatory Visit: Payer: Self-pay

## 2019-09-29 ENCOUNTER — Encounter: Payer: Self-pay | Admitting: Obstetrics & Gynecology

## 2019-09-29 ENCOUNTER — Ambulatory Visit (INDEPENDENT_AMBULATORY_CARE_PROVIDER_SITE_OTHER): Payer: BC Managed Care – PPO | Admitting: Obstetrics & Gynecology

## 2019-09-29 VITALS — BP 112/62 | HR 88 | Temp 98.1°F | Resp 12 | Ht 62.75 in | Wt 196.0 lb

## 2019-09-29 DIAGNOSIS — Z01419 Encounter for gynecological examination (general) (routine) without abnormal findings: Secondary | ICD-10-CM | POA: Diagnosis not present

## 2019-09-29 DIAGNOSIS — R8290 Unspecified abnormal findings in urine: Secondary | ICD-10-CM

## 2019-09-29 LAB — POCT URINALYSIS DIPSTICK
Appearance: NEGATIVE
Bilirubin, UA: NEGATIVE
Blood, UA: 1
Glucose, UA: NEGATIVE
Ketones, UA: POSITIVE
Leukocytes, UA: NEGATIVE
Nitrite, UA: NEGATIVE
Odor: POSITIVE
Protein, UA: NEGATIVE
Urobilinogen, UA: 0.2 E.U./dL
pH, UA: 6 (ref 5.0–8.0)

## 2019-09-29 MED ORDER — SULFAMETHOXAZOLE-TRIMETHOPRIM 800-160 MG PO TABS: 1.0000 | ORAL_TABLET | Freq: Two times a day (BID) | ORAL | 0 refills | Status: DC

## 2019-09-29 NOTE — Progress Notes (Signed)
64 y.o. G2P2001 Legally Separated White or Caucasian female here for annual exam. Patient complains of having odor in urine.    Has lost almost 20 pounds.  She is still working on weight loss.  She is very pleased with Dr. Leafy Ro.  She is now on Saxenda.  She started in the fall.    Denies vaginal bleeding.    Patient's last menstrual period was 09/22/2003.          Sexually active: No.  The current method of family planning is abstinence.    Exercising: Yes.    walking Smoker:  no  Health Maintenance: Pap:  05/31/18 Neg:Neg HR HPV  01/25/15 Neg:Neg HR HPV History of abnormal Pap:  no MMG:  10/03/18 BIRADS 1 negative/density b.  Has MMG scheduled for 10/05/2019 Colonoscopy:  03/12/15 Normal.  Dr. Watt Climes.   BMD:   10/03/18 Osteopenia TDaP:  07/29/19 Pneumonia vaccine(s):  never Shingrix:   Completed Hep C testing: 01/28/16 Neg Screening Labs: PCP   reports that she has never smoked. She has never used smokeless tobacco. She reports previous alcohol use. She reports that she does not use drugs.  Past Medical History:  Diagnosis Date  . Anxiety   . Depression 09/2001  . Elevated BP   . Elevated LDL cholesterol level 1/14  . Feeling lonely   . History of uterine fibroid 11/2002  . HTN (hypertension)   . Joint pain   . Leg edema   . Obesity   . Obstructive sleep apnea     Past Surgical History:  Procedure Laterality Date  . BELPHAROPTOSIS REPAIR      Current Outpatient Medications  Medication Sig Dispense Refill  . cholecalciferol (VITAMIN D3) 25 MCG (1000 UT) tablet Take 2,000 Units by mouth daily.    . Insulin Pen Needle (BD PEN NEEDLE NANO U/F) 32G X 4 MM MISC 1 Device by Does not apply route daily. 100 each 0  . Liraglutide -Weight Management (SAXENDA) 18 MG/3ML SOPN Inject 3 mg into the skin daily. 5 pen 0  . sertraline (ZOLOFT) 50 MG tablet Take 1 tablet (50 mg total) by mouth daily. 30 tablet 0  . triamterene-hydrochlorothiazide (MAXZIDE-25) 37.5-25 MG tablet Take 1 tablet  by mouth daily. 30 tablet 0  . bimatoprost (LATISSE) 0.03 % ophthalmic solution APPLY TO EYELASHES ONCE DAILY     No current facility-administered medications for this visit.    Family History  Problem Relation Age of Onset  . Hypertension Mother   . Hyperlipidemia Mother   . Depression Mother   . Obesity Mother   . Hypertension Father   . Heart attack Father   . Obesity Father   . Hyperlipidemia Father   . Heart disease Father   . Sudden death Daughter   . Colon cancer Maternal Grandmother     Review of Systems  All other systems reviewed and are negative.   Exam:   BP 112/62 (BP Location: Right Arm, Patient Position: Sitting, Cuff Size: Normal)   Pulse 88   Temp 98.1 F (36.7 C) (Temporal)   Resp 12   Ht 5' 2.75" (1.594 m)   Wt 196 lb (88.9 kg)   LMP 09/22/2003   BMI 35.00 kg/m     Height: 5' 2.75" (159.4 cm)  Ht Readings from Last 3 Encounters:  09/29/19 5' 2.75" (1.594 m)  08/01/19 5\' 3"  (1.6 m)  06/19/19 5\' 3"  (1.6 m)    General appearance: alert, cooperative and appears stated age Head: Normocephalic, without  obvious abnormality, atraumatic Neck: no adenopathy, supple, symmetrical, trachea midline and thyroid normal to inspection and palpation Lungs: clear to auscultation bilaterally Breasts: normal appearance, no masses or tenderness Heart: regular rate and rhythm Abdomen: soft, non-tender; bowel sounds normal; no masses,  no organomegaly Extremities: extremities normal, atraumatic, no cyanosis or edema Skin: Skin color, texture, turgor normal. No rashes or lesions Lymph nodes: Cervical, supraclavicular, and axillary nodes normal. No abnormal inguinal nodes palpated Neurologic: Grossly normal   Pelvic: External genitalia:  no lesions              Urethra:  normal appearing urethra with no masses, tenderness or lesions              Bartholins and Skenes: normal                 Vagina: normal appearing vagina with normal color and discharge, no  lesions              Cervix: no lesions              Pap taken: No. Bimanual Exam:  Uterus:  normal size, contour, position, consistency, mobility, non-tender              Adnexa: normal adnexa and no mass, fullness, tenderness               Rectovaginal: Confirms               Anus:  normal sphincter tone, no lesions  Chaperone, Terence Lux, CMA, was present for exam.  A:  Well Woman with normal exam PMP, no HRT Hypertension Obesity H/o suicide in her daughter Urine odor and microscopic hematuria  P:   Mammogram guidelines reviewed.   pap smear with neg HR HPV 2019.  Not indicated today. Lab work done with Dr. Leafy Ro Colonoscopy is UTD.  Follow up this year recommended.  She knows she will be contacted by [facility]. Urine micro and culture pending.  Bactrim DS BID x 3 days. Return annually or prn

## 2019-09-30 LAB — URINALYSIS, MICROSCOPIC ONLY: Casts: NONE SEEN /lpf

## 2019-09-30 LAB — URINE CULTURE: Organism ID, Bacteria: NO GROWTH

## 2019-10-05 ENCOUNTER — Other Ambulatory Visit: Payer: Self-pay

## 2019-10-05 ENCOUNTER — Encounter (INDEPENDENT_AMBULATORY_CARE_PROVIDER_SITE_OTHER): Payer: Self-pay | Admitting: Family Medicine

## 2019-10-05 ENCOUNTER — Ambulatory Visit
Admission: RE | Admit: 2019-10-05 | Discharge: 2019-10-05 | Disposition: A | Discharge status: Home / Self Care | Payer: BC Managed Care – PPO | Source: Ambulatory Visit | Attending: Obstetrics & Gynecology | Admitting: Obstetrics & Gynecology

## 2019-10-05 ENCOUNTER — Ambulatory Visit (INDEPENDENT_AMBULATORY_CARE_PROVIDER_SITE_OTHER): Payer: BC Managed Care – PPO | Admitting: Family Medicine

## 2019-10-05 VITALS — BP 108/68 | HR 67 | Temp 98.0°F | Ht 63.0 in | Wt 192.0 lb

## 2019-10-05 DIAGNOSIS — Z1231 Encounter for screening mammogram for malignant neoplasm of breast: Secondary | ICD-10-CM

## 2019-10-05 DIAGNOSIS — E669 Obesity, unspecified: Secondary | ICD-10-CM | POA: Diagnosis not present

## 2019-10-05 DIAGNOSIS — Z6834 Body mass index (BMI) 34.0-34.9, adult: Secondary | ICD-10-CM | POA: Diagnosis not present

## 2019-10-05 DIAGNOSIS — F3289 Other specified depressive episodes: Secondary | ICD-10-CM

## 2019-10-05 DIAGNOSIS — I10 Essential (primary) hypertension: Secondary | ICD-10-CM

## 2019-10-05 MED ORDER — TRIAMTERENE-HCTZ 37.5-25 MG PO TABS: 1.0000 | ORAL_TABLET | Freq: Every day | ORAL | 0 refills | Status: DC

## 2019-10-05 MED ORDER — SAXENDA 18 MG/3ML ~~LOC~~ SOPN: 3.0000 mg | PEN_INJECTOR | Freq: Every day | SUBCUTANEOUS | 0 refills | Status: DC

## 2019-10-05 MED ORDER — BD PEN NEEDLE NANO U/F 32G X 4 MM MISC: 1.0000 | Freq: Every day | 0 refills | Status: DC

## 2019-10-06 ENCOUNTER — Encounter: Payer: Self-pay | Admitting: Obstetrics & Gynecology

## 2019-10-06 ENCOUNTER — Telehealth: Payer: Self-pay | Admitting: *Deleted

## 2019-10-06 ENCOUNTER — Other Ambulatory Visit: Payer: Self-pay | Admitting: Obstetrics & Gynecology

## 2019-10-06 DIAGNOSIS — N6310 Unspecified lump in the right breast, unspecified quadrant: Secondary | ICD-10-CM

## 2019-10-06 NOTE — Telephone Encounter (Signed)
Pt sent message to update Dr Sabra Heck on [facility].    Routing to Dr Sabra Heck for review and FYI.

## 2019-10-06 NOTE — Telephone Encounter (Signed)
[place] [place] [facility]  Phone Number: [phone]  [facility] did not like the looks of right breast image so I am scheduled for ultrasound and diagnostic scan for Tuesday, Jan. 19. They are closed Monday for [facility]. They are sending results to you

## 2019-10-09 NOTE — Progress Notes (Addendum)
Chief Complaint:   OBESITY Beth Rivers is here to discuss her progress with her obesity treatment plan along with follow-up of her obesity related diagnoses. Beth Rivers is keeping a food journal and averages 900 to 1000 calories and 57 grams of protein daily and states she is following her eating plan approximately 100% of the time. Beth Rivers states she is walking on the treadmill 55 minutes 7 times per week.  Today's visit was #: 23 Starting weight: 220 lbs Starting date: 07/11/2018 Today's weight: 192 lbs Today's date: 10/05/2019 Total lbs lost to date: 28 Total lbs lost since last in-office visit: 3  Interim History: Beth Rivers "ate her way through Christmas", but she is now very much on point with her eating and exercise. She feels Beth Rivers is still working well for satiety and appetite suppression.  Subjective:   Essential hypertension Ki's blood pressure is well controlled. She denies chest pain or shortness of breath.   BP Readings from Last 3 Encounters:  10/05/19 108/68  09/29/19 112/62  08/01/19 106/68   Lab Results  Component Value Date   CREATININE 0.63 08/01/2019   CREATININE 0.63 04/04/2019   CREATININE 0.64 11/03/2018   Other depression,emotional eating  Her mood is much more stable on Zoloft. She is very happy with the effects of Zoloft. She reports being less sad. Her stress eating is well controlled. She shows no sign of suicidal or homicidal ideations.  Assessment/Plan:   Essential hypertension Beth Rivers is working on healthy weight loss and exercise to improve blood pressure control. Beth Rivers agrees to continue Triamterene-HCTZ 37.5-25 mg daily #30 with no refills. We will watch for signs of hypotension as she continues her lifestyle modifications.  Other depression,emotional eating Behavior modification techniques were discussed today to help Beth Rivers deal with her emotional/non-hunger eating behaviors. Beth Rivers will continue taking Zoloft. Orders and follow up as  documented in patient record.   Obesity Beth Rivers is currently in the action stage of change. As such, her goal is to continue with weight loss efforts. She has agreed to following an individualized lower carbohydrate, vegetable and lean protein rich diet plan with at least 1400 calories per day.   Exercise goals: will continue current exercise regimen for weight loss and overall health benefits.  Behavioral modification strategies: increasing water intake and planning for success.  Beth Rivers agrees to continue Saxenda 3.0 mg sub Q daily #5 pens with no refills. Patient will continue at 2.4 mg daily. Prescription was also written today for Nano needles #100 each with no refills.  Beth Rivers has agreed to follow-up with our clinic in 2 weeks. She was informed of the importance of frequent follow-up visits to maximize her success with intensive lifestyle modifications for her multiple health conditions.   Objective:   Blood pressure 108/68, pulse 67, temperature 98 F (36.7 C), temperature source Oral, height 5\' 3"  (1.6 m), weight 192 lb (87.1 kg), last menstrual period 09/22/2003, SpO2 98 %. Body mass index is 34.01 kg/m.  General: Cooperative, alert, well developed, in no acute distress. HEENT: Conjunctivae and lids unremarkable. Cardiovascular: Regular rhythm.  Lungs: Normal work of breathing. Neurologic: No focal deficits.   Lab Results  Component Value Date   CREATININE 0.63 08/01/2019   BUN 25 08/01/2019   NA 142 08/01/2019   K 4.3 08/01/2019   CL 101 08/01/2019   CO2 25 08/01/2019   Lab Results  Component Value Date   ALT 31 08/01/2019   AST 23 08/01/2019   ALKPHOS 67 08/01/2019  BILITOT 0.4 08/01/2019   Lab Results  Component Value Date   HGBA1C 5.1 08/01/2019   HGBA1C 5.3 04/04/2019   HGBA1C 5.2 11/03/2018   HGBA1C 5.2 07/11/2018   HGBA1C 5.3 09/27/2017   Lab Results  Component Value Date   INSULIN 14.8 08/01/2019   INSULIN 10.2 04/04/2019   INSULIN 10.6 11/03/2018    INSULIN 21.0 07/11/2018   Lab Results  Component Value Date   TSH 2.260 07/11/2018   Lab Results  Component Value Date   CHOL 225 (H) 08/01/2019   HDL 45 08/01/2019   LDLCALC 158 (H) 08/01/2019   TRIG 121 08/01/2019   CHOLHDL 5 09/27/2017   Lab Results  Component Value Date   WBC 6.2 07/11/2018   HGB 14.3 07/11/2018   HCT 42.8 07/11/2018   MCV 94 07/11/2018   PLT 253.0 09/27/2017   Lab Results  Component Value Date   IRON 65 02/26/2016   TIBC 280 02/26/2016   FERRITIN 90 02/26/2016    Ref. Range 08/01/2019 08:12  Vitamin D, 25-Hydroxy Latest Ref Range: 30.0 - 100.0 ng/mL 55.6    Attestation Statements:   Reviewed by clinician on day of visit: allergies, medications, problem list, medical history, surgical history, family history, social history, and previous encounter notes.  Corey Skains, am acting as Location manager for [employer], FNP-C.  I have reviewed the above documentation for accuracy and completeness, and I agree with the above. -  Ramonda Galyon [employer], FNP-C

## 2019-10-10 ENCOUNTER — Encounter (INDEPENDENT_AMBULATORY_CARE_PROVIDER_SITE_OTHER): Payer: Self-pay | Admitting: Family Medicine

## 2019-10-10 ENCOUNTER — Ambulatory Visit
Admission: RE | Admit: 2019-10-10 | Discharge: 2019-10-10 | Disposition: A | Discharge status: Home / Self Care | Payer: BC Managed Care – PPO | Source: Ambulatory Visit | Attending: Obstetrics & Gynecology | Admitting: Obstetrics & Gynecology

## 2019-10-10 ENCOUNTER — Other Ambulatory Visit: Payer: Self-pay

## 2019-10-10 DIAGNOSIS — N6310 Unspecified lump in the right breast, unspecified quadrant: Secondary | ICD-10-CM

## 2019-10-10 DIAGNOSIS — R928 Other abnormal and inconclusive findings on diagnostic imaging of breast: Secondary | ICD-10-CM | POA: Diagnosis not present

## 2019-10-10 DIAGNOSIS — N6489 Other specified disorders of breast: Secondary | ICD-10-CM | POA: Diagnosis not present

## 2019-10-18 ENCOUNTER — Telehealth: Payer: Self-pay

## 2019-10-18 NOTE — Telephone Encounter (Signed)
New message    The patient is calling back to get her nuclear stress schedule.

## 2019-10-18 NOTE — Telephone Encounter (Signed)
She had an exercise stress test ordered last July. She would likely need re-evaluation to see if this is still appropriate. These tests expire within 3 months as they are typically expected to be done fairly quickly.

## 2019-10-18 NOTE — Telephone Encounter (Signed)
Pt has been informed and has scheduled an OV for Friday for re-evaluation.

## 2019-10-19 ENCOUNTER — Other Ambulatory Visit: Payer: Self-pay

## 2019-10-19 ENCOUNTER — Encounter (INDEPENDENT_AMBULATORY_CARE_PROVIDER_SITE_OTHER): Payer: Self-pay | Admitting: Family Medicine

## 2019-10-19 ENCOUNTER — Telehealth (INDEPENDENT_AMBULATORY_CARE_PROVIDER_SITE_OTHER): Payer: BC Managed Care – PPO | Admitting: Family Medicine

## 2019-10-19 DIAGNOSIS — E669 Obesity, unspecified: Secondary | ICD-10-CM | POA: Diagnosis not present

## 2019-10-19 DIAGNOSIS — F3289 Other specified depressive episodes: Secondary | ICD-10-CM | POA: Diagnosis not present

## 2019-10-19 DIAGNOSIS — Z6834 Body mass index (BMI) 34.0-34.9, adult: Secondary | ICD-10-CM | POA: Diagnosis not present

## 2019-10-19 DIAGNOSIS — E8881 Metabolic syndrome: Secondary | ICD-10-CM

## 2019-10-19 MED ORDER — SERTRALINE HCL 50 MG PO TABS: 50.0000 mg | ORAL_TABLET | Freq: Every day | ORAL | 0 refills | Status: DC

## 2019-10-19 NOTE — Progress Notes (Signed)
TeleHealth Visit:  Due to the COVID-19 pandemic, this visit was completed with telemedicine (audio/video) technology to reduce patient and provider exposure as well as to preserve personal protective equipment.   Ferne Leverton has verbally consented to this TeleHealth visit. The patient is located at home, the provider is located at the [facility] office. The participants in this visit include the listed provider and patient and any and all parties involved. The visit was conducted today via telephone (24 minute call).  Shawnika was unable to use realtime audiovisual technology today and the telehealth visit was conducted via telephone.  Chief Complaint: OBESITY Cher is here to discuss her progress with her obesity treatment plan along with follow-up of her obesity related diagnoses. Niesa is following a lower carbohydrate, vegetable and lean protein rich diet plan and states she is following her eating plan approximately 100% of the time. Aureliana states she is walking outside  For 45 minutes 7 times per week and she is walking on the treadmill for 57 minutes 7 times per week.  Today's visit was #: 24 Starting weight: 220 lbs Starting date: 07/11/2018  Interim History: Sophia reports she has lost one lb. Weight today and is 192 pounds (10/18/19). She is following the low carb plan. She is drinking [employer] milk but otherwise she is limiting her carbs. She recently read " The Obesity Code", and she is doing some intermittent fasting along with the low carb plan. Eirini is on Saxenda 2.4 mg and reports good appetite suppression.  Subjective:   Insulin resistance Liane has a diagnosis of insulin resistance based on her elevated fasting insulin level of 14.8. She denies polyphagia. Tiffinee is currently on Saxenda. She continues to work on diet and exercise to decrease her risk of diabetes.  Lab Results  Component Value Date   INSULIN 14.8 08/01/2019   INSULIN 10.2 04/04/2019   INSULIN 10.6 11/03/2018   INSULIN 21.0 07/11/2018   Lab Results  Component Value Date   HGBA1C 5.1 08/01/2019   Other depression,emotional eating  Her mood is stable on Zoloft 50 mg. She reports no emotional eating. She shows no sign of suicidal or homicidal ideations.  Assessment/Plan:   Insulin resistance Shaqualla will continue with the meal plan and she will continue to work on weight loss, exercise, and decreasing simple carbohydrates to help decrease the risk of diabetes. Leeanne agreed to follow-up with [place] as directed to closely monitor her progress.  Other depression,emotional eating  Mackinzi agrees to continue Zoloft 50 mg daily #30 with no refills. Orders and follow up as documented in patient record.   Class 1 obesity with serious comorbidity and body mass index (BMI) of 34.0 to 34.9 in adult, unspecified obesity type Maylan is currently in the action stage of change. As such, her goal is to continue with weight loss efforts. She has agreed to keeping a food journal and adhering to recommended goals of 1500 calories and 85 to 90 grams of protein daily.   Exercise goals: Monai will continue her current exercise regimen.  Behavioral modification strategies: decreasing simple carbohydrates and planning for success.  Callee will continue Saxenda 2.4 mg daily.  Lataunya has agreed to follow-up with our clinic in 3 weeks.   Objective:   VITALS: Per patient if applicable, see vitals. GENERAL: Alert and in no acute distress. CARDIOPULMONARY: No increased WOB. Speaking in clear sentences.  PSYCH: Pleasant and cooperative. Speech normal rate and rhythm. Affect is appropriate. Insight and judgement are  appropriate. Attention is focused, linear, and appropriate.  NEURO: Oriented as arrived to appointment on time with no prompting.   Lab Results  Component Value Date   CREATININE 0.63 08/01/2019   BUN 25 08/01/2019   NA 142 08/01/2019   K 4.3 08/01/2019   CL 101 08/01/2019   CO2 25  08/01/2019   Lab Results  Component Value Date   ALT 31 08/01/2019   AST 23 08/01/2019   ALKPHOS 67 08/01/2019   BILITOT 0.4 08/01/2019   Lab Results  Component Value Date   HGBA1C 5.1 08/01/2019   HGBA1C 5.3 04/04/2019   HGBA1C 5.2 11/03/2018   HGBA1C 5.2 07/11/2018   HGBA1C 5.3 09/27/2017   Lab Results  Component Value Date   INSULIN 14.8 08/01/2019   INSULIN 10.2 04/04/2019   INSULIN 10.6 11/03/2018   INSULIN 21.0 07/11/2018   Lab Results  Component Value Date   TSH 2.260 07/11/2018   Lab Results  Component Value Date   CHOL 225 (H) 08/01/2019   HDL 45 08/01/2019   LDLCALC 158 (H) 08/01/2019   TRIG 121 08/01/2019   CHOLHDL 5 09/27/2017   Lab Results  Component Value Date   WBC 6.2 07/11/2018   HGB 14.3 07/11/2018   HCT 42.8 07/11/2018   MCV 94 07/11/2018   PLT 253.0 09/27/2017   Lab Results  Component Value Date   IRON 65 02/26/2016   TIBC 280 02/26/2016   FERRITIN 90 02/26/2016    Ref. Range 08/01/2019 08:12  Vitamin D, 25-Hydroxy Latest Ref Range: 30.0 - 100.0 ng/mL 55.6    Attestation Statements:   Reviewed by clinician on day of visit: allergies, medications, problem list, medical history, surgical history, family history, social history, and previous encounter notes.   Corey Skains, am acting as Location manager for [employer], FNP-C.  I have reviewed the above documentation for accuracy and completeness, and I agree with the above. - Nevena Rozenberg [employer], FNP-C

## 2019-10-20 ENCOUNTER — Ambulatory Visit (INDEPENDENT_AMBULATORY_CARE_PROVIDER_SITE_OTHER): Payer: BC Managed Care – PPO | Admitting: Internal Medicine

## 2019-10-20 ENCOUNTER — Encounter: Payer: Self-pay | Admitting: Internal Medicine

## 2019-10-20 ENCOUNTER — Other Ambulatory Visit: Payer: Self-pay

## 2019-10-20 VITALS — BP 120/76 | HR 66 | Temp 99.0°F | Ht 63.0 in | Wt 200.0 lb

## 2019-10-20 DIAGNOSIS — I10 Essential (primary) hypertension: Secondary | ICD-10-CM

## 2019-10-20 DIAGNOSIS — Z8249 Family history of ischemic heart disease and other diseases of the circulatory system: Secondary | ICD-10-CM

## 2019-10-20 NOTE — Assessment & Plan Note (Signed)
EKG done and normal. Will order exercise tolerance test to assess for CAD at this time.

## 2019-10-20 NOTE — Progress Notes (Signed)
   Subjective:   Patient ID: Beth Rivers, female    DOB: Apr 20, 1956, 64 y.o.   MRN: [id number]  HPI The patient is a 64 YO female coming in for wanting a stress test. This was ordered for screening back in the summer. She did not follow up with getting this done. We needed to make sure she was not having any new symptoms and update EKG since she is now about 2 years since last EKG. She denies chest pain or pressure. Was needed at work previously and this is why she did not do stress test but still interested in pursuing this. Does have family history of heart disease which is severe in multiple family members. Is a non-smoker. Has hypertension which is well controlled. Does not have diabetes.   PMH, [facility], social history reviewed and updated.   Review of Systems  Constitutional: Negative.   HENT: Negative.   Eyes: Negative.   Respiratory: Negative for cough, chest tightness and shortness of breath.   Cardiovascular: Negative for chest pain, palpitations and leg swelling.  Gastrointestinal: Negative for abdominal distention, abdominal pain, constipation, diarrhea, nausea and vomiting.  Musculoskeletal: Negative.   Skin: Negative.   Neurological: Negative.   Psychiatric/Behavioral: Negative.     Objective:  Physical Exam Constitutional:      Appearance: She is well-developed. She is obese.  HENT:     Head: Normocephalic and atraumatic.  Cardiovascular:     Rate and Rhythm: Normal rate and regular rhythm.  Pulmonary:     Effort: Pulmonary effort is normal. No respiratory distress.     Breath sounds: Normal breath sounds. No wheezing or rales.  Abdominal:     General: Bowel sounds are normal. There is no distension.     Palpations: Abdomen is soft.     Tenderness: There is no abdominal tenderness. There is no rebound.  Musculoskeletal:     Cervical back: Normal range of motion.  Skin:    General: Skin is warm and dry.  Neurological:     Mental Status: She is alert and oriented to  person, place, and time.     Coordination: Coordination normal.     Vitals:   10/20/19 0815  BP: 120/76  Pulse: 66  Temp: 99 F (37.2 C)  TempSrc: Oral  SpO2: 97%  Weight: 200 lb (90.7 kg)  Height: 5\' 3"  (1.6 m)   EKG: Rate 61, axis normal, intervals normal, sinus, no st or t wave changes, no significant change from 2019  This visit occurred during the SARS-CoV-2 public health emergency.  Safety protocols were in place, including screening questions prior to the visit, additional usage of staff PPE, and extensive cleaning of exam room while observing appropriate contact time as indicated for disinfecting solutions.   Assessment & Plan:

## 2019-10-20 NOTE — Assessment & Plan Note (Signed)
BP at goal, continue hctz 25 mg daily. EKG done and normal without changes from prior. Order exercise tolerance test to assess for CAD/blockages.

## 2019-10-20 NOTE — Patient Instructions (Signed)
The EKG today looks normal and not changed from before.   We will get the stress test done.

## 2019-10-24 DIAGNOSIS — G4733 Obstructive sleep apnea (adult) (pediatric): Secondary | ICD-10-CM | POA: Diagnosis not present

## 2019-11-01 ENCOUNTER — Other Ambulatory Visit: Payer: Self-pay | Admitting: Internal Medicine

## 2019-11-01 DIAGNOSIS — Z8249 Family history of ischemic heart disease and other diseases of the circulatory system: Secondary | ICD-10-CM

## 2019-11-02 ENCOUNTER — Ambulatory Visit (INDEPENDENT_AMBULATORY_CARE_PROVIDER_SITE_OTHER): Payer: BC Managed Care – PPO | Admitting: Family Medicine

## 2019-11-07 ENCOUNTER — Ambulatory Visit (INDEPENDENT_AMBULATORY_CARE_PROVIDER_SITE_OTHER): Payer: BC Managed Care – PPO | Admitting: Family Medicine

## 2019-11-14 ENCOUNTER — Other Ambulatory Visit (HOSPITAL_COMMUNITY)
Admission: RE | Admit: 2019-11-14 | Discharge: 2019-11-14 | Disposition: A | Discharge status: Home / Self Care | Payer: BC Managed Care – PPO | Source: Ambulatory Visit | Attending: Internal Medicine | Admitting: Internal Medicine

## 2019-11-14 ENCOUNTER — Inpatient Hospital Stay (HOSPITAL_COMMUNITY): Admission: RE | Admit: 2019-11-14 | Payer: BC Managed Care – PPO | Source: Ambulatory Visit

## 2019-11-14 DIAGNOSIS — Z01812 Encounter for preprocedural laboratory examination: Secondary | ICD-10-CM | POA: Diagnosis not present

## 2019-11-14 DIAGNOSIS — Z20822 Contact with and (suspected) exposure to covid-19: Secondary | ICD-10-CM | POA: Insufficient documentation

## 2019-11-14 LAB — SARS CORONAVIRUS 2 (TAT 6-24 HRS): SARS Coronavirus 2: NEGATIVE

## 2019-11-15 ENCOUNTER — Ambulatory Visit (INDEPENDENT_AMBULATORY_CARE_PROVIDER_SITE_OTHER): Payer: BC Managed Care – PPO | Admitting: Family Medicine

## 2019-11-15 ENCOUNTER — Telehealth (HOSPITAL_COMMUNITY): Payer: Self-pay

## 2019-11-15 NOTE — Telephone Encounter (Signed)
Encounter complete. 

## 2019-11-17 ENCOUNTER — Other Ambulatory Visit: Payer: Self-pay

## 2019-11-17 ENCOUNTER — Ambulatory Visit (HOSPITAL_COMMUNITY)
Admission: RE | Admit: 2019-11-17 | Discharge: 2019-11-17 | Disposition: A | Discharge status: Home / Self Care | Payer: BC Managed Care – PPO | Source: Ambulatory Visit | Attending: Cardiovascular Disease | Admitting: Cardiovascular Disease

## 2019-11-17 DIAGNOSIS — Z8249 Family history of ischemic heart disease and other diseases of the circulatory system: Secondary | ICD-10-CM | POA: Insufficient documentation

## 2019-11-17 LAB — EXERCISE TOLERANCE TEST
Estimated workload: 9.1 METS
Exercise duration (min): 7 min
Exercise duration (sec): 30 s
MPHR: 158 {beats}/min
Peak HR: 151 {beats}/min
Percent HR: 95 %
Rest HR: 81 {beats}/min

## 2019-11-23 ENCOUNTER — Encounter (INDEPENDENT_AMBULATORY_CARE_PROVIDER_SITE_OTHER): Payer: Self-pay | Admitting: Family Medicine

## 2019-11-30 ENCOUNTER — Other Ambulatory Visit: Payer: Self-pay

## 2019-11-30 ENCOUNTER — Encounter (INDEPENDENT_AMBULATORY_CARE_PROVIDER_SITE_OTHER): Payer: Self-pay | Admitting: Family Medicine

## 2019-11-30 ENCOUNTER — Ambulatory Visit (INDEPENDENT_AMBULATORY_CARE_PROVIDER_SITE_OTHER): Payer: BC Managed Care – PPO | Admitting: Family Medicine

## 2019-11-30 VITALS — BP 125/73 | HR 74 | Temp 98.0°F | Ht 63.0 in | Wt 201.0 lb

## 2019-11-30 DIAGNOSIS — F3289 Other specified depressive episodes: Secondary | ICD-10-CM

## 2019-11-30 DIAGNOSIS — I10 Essential (primary) hypertension: Secondary | ICD-10-CM | POA: Diagnosis not present

## 2019-11-30 DIAGNOSIS — Z6835 Body mass index (BMI) 35.0-35.9, adult: Secondary | ICD-10-CM | POA: Diagnosis not present

## 2019-11-30 MED ORDER — BD PEN NEEDLE NANO U/F 32G X 4 MM MISC: 1.0000 | Freq: Every day | 0 refills | Status: DC

## 2019-11-30 MED ORDER — TRIAMTERENE-HCTZ 37.5-25 MG PO TABS: 1.0000 | ORAL_TABLET | Freq: Every day | ORAL | 0 refills | Status: DC

## 2019-11-30 MED ORDER — SAXENDA 18 MG/3ML ~~LOC~~ SOPN: 3.0000 mg | PEN_INJECTOR | Freq: Every day | SUBCUTANEOUS | 0 refills | Status: DC

## 2019-11-30 MED ORDER — SERTRALINE HCL 50 MG PO TABS: 50.0000 mg | ORAL_TABLET | Freq: Every day | ORAL | 0 refills | Status: DC

## 2019-11-30 NOTE — Progress Notes (Signed)
Chief Complaint:   OBESITY Beth Rivers is here to discuss her progress with her obesity treatment plan along with follow-up of her obesity related diagnoses. Beth Rivers is on the Category 3 Plan and states she is following her eating plan approximately 0% of the time. Beth Rivers states she is exercising 0 minutes 0 times per week.  Today's visit was #: 25 Starting weight: 220 lbs Starting date: 07/11/2018 Today's weight: 201 lbs Today's date: 11/30/2019 Total lbs lost to date: 19 Total lbs lost since last in-office visit: 0  Interim History: Beth Rivers has been off plan for about a month and wants to get back on track.  Subjective:   Essential hypertension.  Blood pressure is well controlled on Maxzide. No chest pain or shortness of breath.  BP Readings from Last 3 Encounters:  11/30/19 125/73  10/20/19 120/76  10/05/19 108/68   Lab Results  Component Value Date   CREATININE 0.63 08/01/2019   CREATININE 0.63 04/04/2019   CREATININE 0.64 11/03/2018   Other depression,emotional eating. Beth Rivers is struggling with emotional eating and using food for comfort to the extent that it is negatively impacting her health. She has been working on behavior modification techniques to help reduce her emotional eating and has been somewhat successful. She shows no sign of suicidal or homicidal ideations. Mood is stable on sertraline. She feels it helps greatly. She reports boredom eating.  Assessment/Plan:   Essential hypertension. Beth Rivers is working on healthy weight loss and exercise to improve blood pressure control. We will watch for signs of hypotension as she continues her lifestyle modifications. Beth Rivers was given a refill on her triamterene-hydrochlorothiazide (MAXZIDE-25) 37.5-25 MG tablet daily #30 with 0 refills.  Other depression,emotional eating. Behavior modification techniques were discussed today to help Beth Rivers deal with her emotional/non-hunger eating behaviors.  Orders and follow up as  documented in patient record. She was given a refill on her sertraline (ZOLOFT) 50 MG tablet QD #30 with 0 refills.  Class 2 severe obesity with serious comorbidity and body mass index (BMI) of 35.0 to 35.9 in adult, unspecified obesity type (Beth Rivers).   Beth Rivers is on Saxenda 2.4 mg a day, which is still working for appetite suppression. She will increase the dose to 3.0 mg daily and was given a prescription for Liraglutide -Weight Management (SAXENDA) 18 MG/3ML SOPN 3 mg daily #5 with 0 refills and was given a refill on her Insulin Pen Needle (BD PEN NEEDLE NANO U/F) 32G X 4 MM MISC.  Beth Rivers is currently in the action stage of change. As such, her goal is to continue with weight loss efforts. She has agreed to the Category 3 Plan.   Exercise goals: Beth Rivers will walk 6 miles 7 days per week (per patient).  Behavioral modification strategies: increasing lean protein intake and decreasing simple carbohydrates.  Beth Rivers has agreed to follow-up with our clinic in 3 weeks. She was informed of the importance of frequent follow-up visits to maximize her success with intensive lifestyle modifications for her multiple health conditions.   Objective:   Blood pressure 125/73, pulse 74, temperature 98 F (36.7 C), temperature source Oral, height 5\' 3"  (1.6 m), weight 201 lb (91.2 kg), last menstrual period 09/22/2003, SpO2 97 %. Body mass index is 35.61 kg/m.  General: Cooperative, alert, well developed, in no acute distress. HEENT: Conjunctivae and lids unremarkable. Cardiovascular: Regular rhythm.  Lungs: Normal work of breathing. Neurologic: No focal deficits.   Lab Results  Component Value Date   CREATININE 0.63 08/01/2019  BUN 25 08/01/2019   NA 142 08/01/2019   K 4.3 08/01/2019   CL 101 08/01/2019   CO2 25 08/01/2019   Lab Results  Component Value Date   ALT 31 08/01/2019   AST 23 08/01/2019   ALKPHOS 67 08/01/2019   BILITOT 0.4 08/01/2019   Lab Results  Component Value Date   HGBA1C 5.1  08/01/2019   HGBA1C 5.3 04/04/2019   HGBA1C 5.2 11/03/2018   HGBA1C 5.2 07/11/2018   HGBA1C 5.3 09/27/2017   Lab Results  Component Value Date   INSULIN 14.8 08/01/2019   INSULIN 10.2 04/04/2019   INSULIN 10.6 11/03/2018   INSULIN 21.0 07/11/2018   Lab Results  Component Value Date   TSH 2.260 07/11/2018   Lab Results  Component Value Date   CHOL 225 (H) 08/01/2019   HDL 45 08/01/2019   LDLCALC 158 (H) 08/01/2019   TRIG 121 08/01/2019   CHOLHDL 5 09/27/2017   Lab Results  Component Value Date   WBC 6.2 07/11/2018   HGB 14.3 07/11/2018   HCT 42.8 07/11/2018   MCV 94 07/11/2018   PLT 253.0 09/27/2017   Lab Results  Component Value Date   IRON 65 02/26/2016   TIBC 280 02/26/2016   FERRITIN 90 02/26/2016   Attestation Statements:   Reviewed by clinician on day of visit: allergies, medications, problem list, medical history, surgical history, family history, social history, and previous encounter notes.  IMichaelene Song, am acting as Location manager for [employer], FNP   I have reviewed the above documentation for accuracy and completeness, and I agree with the above. -  Georgianne Fick, FNP

## 2019-12-01 ENCOUNTER — Encounter (INDEPENDENT_AMBULATORY_CARE_PROVIDER_SITE_OTHER): Payer: Self-pay | Admitting: Family Medicine

## 2019-12-18 ENCOUNTER — Ambulatory Visit (INDEPENDENT_AMBULATORY_CARE_PROVIDER_SITE_OTHER): Payer: BC Managed Care – PPO | Admitting: Family Medicine

## 2020-01-08 ENCOUNTER — Ambulatory Visit (INDEPENDENT_AMBULATORY_CARE_PROVIDER_SITE_OTHER): Payer: BC Managed Care – PPO | Admitting: Family Medicine

## 2020-01-09 ENCOUNTER — Ambulatory Visit (INDEPENDENT_AMBULATORY_CARE_PROVIDER_SITE_OTHER): Payer: BC Managed Care – PPO | Admitting: Family Medicine

## 2020-01-09 ENCOUNTER — Encounter (INDEPENDENT_AMBULATORY_CARE_PROVIDER_SITE_OTHER): Payer: Self-pay | Admitting: Family Medicine

## 2020-01-09 ENCOUNTER — Other Ambulatory Visit: Payer: Self-pay

## 2020-01-09 VITALS — BP 113/71 | HR 76 | Temp 98.1°F | Ht 63.0 in | Wt 203.0 lb

## 2020-01-09 DIAGNOSIS — I10 Essential (primary) hypertension: Secondary | ICD-10-CM | POA: Diagnosis not present

## 2020-01-09 DIAGNOSIS — R002 Palpitations: Secondary | ICD-10-CM | POA: Diagnosis not present

## 2020-01-09 DIAGNOSIS — R7309 Other abnormal glucose: Secondary | ICD-10-CM | POA: Diagnosis not present

## 2020-01-09 DIAGNOSIS — E7849 Other hyperlipidemia: Secondary | ICD-10-CM

## 2020-01-09 DIAGNOSIS — Z9189 Other specified personal risk factors, not elsewhere classified: Secondary | ICD-10-CM | POA: Diagnosis not present

## 2020-01-09 DIAGNOSIS — Z6836 Body mass index (BMI) 36.0-36.9, adult: Secondary | ICD-10-CM

## 2020-01-09 DIAGNOSIS — F3289 Other specified depressive episodes: Secondary | ICD-10-CM | POA: Diagnosis not present

## 2020-01-09 MED ORDER — TRIAMTERENE-HCTZ 37.5-25 MG PO TABS: 1.0000 | ORAL_TABLET | Freq: Every day | ORAL | 0 refills | Status: DC

## 2020-01-09 MED ORDER — BD PEN NEEDLE NANO U/F 32G X 4 MM MISC: 1.0000 | Freq: Every day | 0 refills | Status: DC

## 2020-01-09 MED ORDER — SERTRALINE HCL 50 MG PO TABS: 50.0000 mg | ORAL_TABLET | Freq: Every day | ORAL | 0 refills | Status: DC

## 2020-01-09 MED ORDER — SAXENDA 18 MG/3ML ~~LOC~~ SOPN: 3.0000 mg | PEN_INJECTOR | Freq: Every day | SUBCUTANEOUS | 0 refills | Status: DC

## 2020-01-09 NOTE — Progress Notes (Signed)
The 10-year ASCVD risk score [name] DC Brooke Bonito., et al., 2013) is: 5.2%   Values used to calculate the score:     Age: 64 years     Sex: Female     Is Non-Hispanic African American: No     Diabetic: No     Tobacco smoker: No     Systolic Blood Pressure: 123456 mmHg     Is BP treated: Yes     HDL Cholesterol: 45 mg/dL     Total Cholesterol: 225 mg/dL

## 2020-01-10 ENCOUNTER — Encounter (INDEPENDENT_AMBULATORY_CARE_PROVIDER_SITE_OTHER): Payer: Self-pay | Admitting: Family Medicine

## 2020-01-10 LAB — LIPID PANEL WITH LDL/HDL RATIO
Cholesterol, Total: 230 mg/dL — ABNORMAL HIGH (ref 100–199)
HDL: 53 mg/dL (ref >39)
LDL Chol Calc (NIH): 158 mg/dL — ABNORMAL HIGH (ref 0–99)
LDL/HDL Ratio: 3 ratio (ref 0.0–3.2)
Triglycerides: 105 mg/dL (ref 0–149)
VLDL Cholesterol Cal: 19 mg/dL (ref 5–40)

## 2020-01-10 LAB — COMPREHENSIVE METABOLIC PANEL
ALT: 22 IU/L (ref 0–32)
AST: 23 IU/L (ref 0–40)
Albumin/Globulin Ratio: 1.6 (ref 1.2–2.2)
Albumin: 4.2 g/dL (ref 3.8–4.8)
Alkaline Phosphatase: 63 IU/L (ref 39–117)
BUN/Creatinine Ratio: 31 — ABNORMAL HIGH (ref 12–28)
BUN: 21 mg/dL (ref 8–27)
Bilirubin Total: 0.3 mg/dL (ref 0.0–1.2)
CO2: 25 mmol/L (ref 20–29)
Calcium: 9.4 mg/dL (ref 8.7–10.3)
Chloride: 105 mmol/L (ref 96–106)
Creatinine, Ser: 0.67 mg/dL (ref 0.57–1.00)
GFR calc Af Amer: 109 mL/min/{1.73_m2} (ref >59)
GFR calc non Af Amer: 95 mL/min/{1.73_m2} (ref >59)
Globulin, Total: 2.6 g/dL (ref 1.5–4.5)
Glucose: 100 mg/dL — ABNORMAL HIGH (ref 65–99)
Potassium: 4.4 mmol/L (ref 3.5–5.2)
Sodium: 144 mmol/L (ref 134–144)
Total Protein: 6.8 g/dL (ref 6.0–8.5)

## 2020-01-10 LAB — VITAMIN D 25 HYDROXY (VIT D DEFICIENCY, FRACTURES): Vit D, 25-Hydroxy: 64.5 ng/mL (ref 30.0–100.0)

## 2020-01-10 LAB — INSULIN, RANDOM: INSULIN: 16.8 u[IU]/mL (ref 2.6–24.9)

## 2020-01-10 LAB — HEMOGLOBIN A1C
Est. average glucose Bld gHb Est-mCnc: 100 mg/dL
Hgb A1c MFr Bld: 5.1 % (ref 4.8–5.6)

## 2020-01-10 LAB — CALCITRIOL (1,25 DI-OH VIT D): Vit D, 1,25-Dihydroxy: 58.9 pg/mL (ref 19.9–79.3)

## 2020-01-10 NOTE — Progress Notes (Signed)
Chief Complaint:   OBESITY Beth Rivers is here to discuss her progress with her obesity treatment plan along with follow-up of her obesity related diagnoses. Beth Rivers is on the Category 3 Plan and states she is following her eating plan approximately 50% of the time. Beth Rivers states she is walking for 120 minutes 7 times per week.  Today's visit was #: 43 Starting weight: 220 lbs Starting date: 07/11/2018 Today's weight: 203 lbs Today's date: 01/09/2020 Total lbs lost to date: 17 Total lbs lost since last in-office visit: 0  Interim History: Beth Rivers stopped Saxenda 2 weeks ago because she was unsure if it was helping with appetite. She notes some binge type eating recently. She feels it is related to it being the time of year of her daughter's birthday. She passed away 7 years ago.   Subjective:   1. Other hyperlipidemia Beth Rivers is not on statin. Last LDL was high at 158, HDL is low at 45, and triglycerides are normal. . The 10-year ASCVD risk score Beth Rivers DC Beth Rivers., et al., 2013) is: 4.8%   Values used to calculate the score:     Age: 64 years     Sex: Female     Is Non-Hispanic African American: No     Diabetic: No     Tobacco smoker: No     Systolic Blood Pressure: 123456 mmHg     Is BP treated: Yes     HDL Cholesterol: 53 mg/dL     Total Cholesterol: 230 mg/dL   2. Essential hypertension Beth Rivers's blood pressure is well controlled. BP Readings from Last 3 Encounters:  01/09/20 113/71  11/30/19 125/73  10/20/19 120/76     3. Palpitations Beth Rivers has noticed brief periods of palpitations at rest recently. She had a recent negative stress test.  4. Other depression,emotional eating Beth Rivers reports some binge type eating recently which has not subsided. Her mood is overall stable even with it being the around the time of her daughter's birthday (she passed away 8 years ago). She feels the sertraline is quite beneficial for her mood.   5. At risk for heart disease Beth Rivers is at a higher than  average risk for cardiovascular disease due to obesity, hyperlipidemia.   Assessment/Plan:   1. Other hyperlipidemia Cardiovascular risk and specific lipid/LDL goals reviewed. We discussed several lifestyle modifications today and Beth Rivers will continue to work on diet, exercise and weight loss efforts. We will check labs today.  - Comprehensive metabolic panel - Hemoglobin A1c - Insulin, random - Lipid Panel With LDL/HDL Ratio  2. Essential hypertension Beth Rivers is working on healthy weight loss and exercise to improve blood pressure control. We will refill triamterene-hydrochlorothiazide for 1 month.   - triamterene-hydrochlorothiazide (MAXZIDE-25) 37.5-25 MG tablet; Take 1 tablet by mouth daily.  Dispense: 30 tablet; Refill: 0  3. Palpitations Beth Rivers will follow up with Cardiology if this occurs again.   4. Other depression,emotional eating Behavior modification techniques were discussed today to help Beth Rivers deal with her emotional/non-hunger eating behaviors. We will refill sertraline for 1 month. Orders and follow up as documented in patient record.   - sertraline (ZOLOFT) 50 MG tablet; Take 1 tablet (50 mg total) by mouth daily.  Dispense: 30 tablet; Refill: 0  5. At risk for heart disease Beth Rivers was given approximately 15 minutes of coronary artery disease prevention counseling today. She is 63 y.o. female and has risk factors for heart disease including obesity, hyperlipidemia, and palpitations. We discussed intensive lifestyle modifications today with  an emphasis on specific weight loss instructions and strategies.   Repetitive spaced learning was employed today to elicit superior memory formation and behavioral change.  6. Class 2 severe obesity with serious comorbidity and body mass index (BMI) of 36.0 to 36.9 in adult, unspecified obesity type (HCC) Beth Rivers is currently in the action stage of change. As such, her goal is to continue with weight loss efforts. She has agreed to the  Category 3 Plan.   We discussed various medication options to help Beth Rivers with her weight loss efforts and we both agreed to continue Saxenda and we will refill for 1 month and nano needles #100 with no refills. Beth Rivers will start Saxenda back if she notices increased appetite).  - Liraglutide -Weight Management (SAXENDA) 18 MG/3ML SOPN; Inject 0.5 mLs (3 mg total) into the skin daily.  Dispense: 5 pen; Refill: 0 - Insulin Pen Needle (BD PEN NEEDLE NANO U/F) 32G X 4 MM MISC; 1 Device by Does not apply route daily.  Dispense: 100 each; Refill: 0  Exercise goals: As is.  Behavioral modification strategies: increasing lean protein intake and decreasing simple carbohydrates.  Beth Rivers has agreed to follow-up with our clinic in 2 weeks. She was informed of the importance of frequent follow-up visits to maximize her success with intensive lifestyle modifications for her multiple health conditions.   Beth Rivers was informed we would discuss her lab results at her next visit unless there is a critical issue that needs to be addressed sooner. Beth Rivers agreed to keep her next visit at the agreed upon time to discuss these results.  Objective:   Blood pressure 113/71, pulse 76, temperature 98.1 F (36.7 C), temperature source Oral, height 5\' 3"  (1.6 m), weight 203 lb (92.1 kg), last menstrual period 09/22/2003, SpO2 97 %. Body mass index is 35.96 kg/m.  General: Cooperative, alert, well developed, in no acute distress. HEENT: Conjunctivae and lids unremarkable. Cardiovascular: Regular rhythm.  Lungs: Normal work of breathing. Neurologic: No focal deficits.   Lab Results  Component Value Date   CREATININE 0.67 01/09/2020   BUN 21 01/09/2020   NA 144 01/09/2020   K 4.4 01/09/2020   CL 105 01/09/2020   CO2 25 01/09/2020   Lab Results  Component Value Date   ALT 22 01/09/2020   AST 23 01/09/2020   ALKPHOS 63 01/09/2020   BILITOT 0.3 01/09/2020   Lab Results  Component Value Date   HGBA1C 5.1  01/09/2020   HGBA1C 5.1 08/01/2019   HGBA1C 5.3 04/04/2019   HGBA1C 5.2 11/03/2018   HGBA1C 5.2 07/11/2018   Lab Results  Component Value Date   INSULIN 16.8 01/09/2020   INSULIN 14.8 08/01/2019   INSULIN 10.2 04/04/2019   INSULIN 10.6 11/03/2018   INSULIN 21.0 07/11/2018   Lab Results  Component Value Date   TSH 2.260 07/11/2018   Lab Results  Component Value Date   CHOL 230 (H) 01/09/2020   HDL 53 01/09/2020   LDLCALC 158 (H) 01/09/2020   TRIG 105 01/09/2020   CHOLHDL 5 09/27/2017   Lab Results  Component Value Date   WBC 6.2 07/11/2018   HGB 14.3 07/11/2018   HCT 42.8 07/11/2018   MCV 94 07/11/2018   PLT 253.0 09/27/2017   Lab Results  Component Value Date   IRON 65 02/26/2016   TIBC 280 02/26/2016   FERRITIN 90 02/26/2016   Attestation Statements:   Reviewed by clinician on day of visit: allergies, medications, problem list, medical history, surgical history, family history,  social history, and previous encounter notes.   Wilhemena Durie, am acting as Location manager for [employer], FNP-C.  I have reviewed the above documentation for accuracy and completeness, and I agree with the above. -  Georgianne Fick, FNP

## 2020-01-10 NOTE — Telephone Encounter (Signed)
Please see message. °

## 2020-01-10 NOTE — Telephone Encounter (Signed)
FYI

## 2020-01-23 ENCOUNTER — Ambulatory Visit (INDEPENDENT_AMBULATORY_CARE_PROVIDER_SITE_OTHER): Payer: BC Managed Care – PPO | Admitting: Family Medicine

## 2020-02-15 ENCOUNTER — Encounter (INDEPENDENT_AMBULATORY_CARE_PROVIDER_SITE_OTHER): Payer: Self-pay | Admitting: Family Medicine

## 2020-02-15 ENCOUNTER — Other Ambulatory Visit: Payer: Self-pay

## 2020-02-15 ENCOUNTER — Ambulatory Visit (INDEPENDENT_AMBULATORY_CARE_PROVIDER_SITE_OTHER): Payer: BC Managed Care – PPO | Admitting: Family Medicine

## 2020-02-15 VITALS — BP 143/61 | HR 83 | Temp 98.5°F | Ht 63.0 in | Wt 199.0 lb

## 2020-02-15 DIAGNOSIS — F3289 Other specified depressive episodes: Secondary | ICD-10-CM

## 2020-02-15 DIAGNOSIS — I10 Essential (primary) hypertension: Secondary | ICD-10-CM

## 2020-02-15 DIAGNOSIS — Z6835 Body mass index (BMI) 35.0-35.9, adult: Secondary | ICD-10-CM | POA: Diagnosis not present

## 2020-02-15 NOTE — Progress Notes (Signed)
The 10-year ASCVD risk score [name] DC Brooke Bonito., et al., 2013) is: 8.3%   Values used to calculate the score:     Age: 64 years     Sex: Female     Is Non-Hispanic African American: No     Diabetic: No     Tobacco smoker: No     Systolic Blood Pressure: [id number] mmHg     Is BP treated: Yes     HDL Cholesterol: 53 mg/dL     Total Cholesterol: 230 mg/dL

## 2020-02-16 DIAGNOSIS — G4733 Obstructive sleep apnea (adult) (pediatric): Secondary | ICD-10-CM | POA: Diagnosis not present

## 2020-02-19 ENCOUNTER — Other Ambulatory Visit (INDEPENDENT_AMBULATORY_CARE_PROVIDER_SITE_OTHER): Payer: Self-pay | Admitting: Family Medicine

## 2020-02-19 DIAGNOSIS — I10 Essential (primary) hypertension: Secondary | ICD-10-CM

## 2020-02-19 DIAGNOSIS — F3289 Other specified depressive episodes: Secondary | ICD-10-CM

## 2020-02-19 MED ORDER — TRIAMTERENE-HCTZ 37.5-25 MG PO TABS: 1.0000 | ORAL_TABLET | Freq: Every day | ORAL | 0 refills | Status: DC

## 2020-02-19 MED ORDER — SERTRALINE HCL 25 MG PO TABS: 25.0000 mg | ORAL_TABLET | Freq: Every day | ORAL | 0 refills | Status: DC

## 2020-02-19 MED ORDER — SAXENDA 18 MG/3ML ~~LOC~~ SOPN: 3.0000 mg | PEN_INJECTOR | Freq: Every day | SUBCUTANEOUS | 0 refills | Status: DC

## 2020-02-20 ENCOUNTER — Encounter (INDEPENDENT_AMBULATORY_CARE_PROVIDER_SITE_OTHER): Payer: Self-pay | Admitting: Family Medicine

## 2020-02-20 NOTE — Progress Notes (Signed)
Chief Complaint:   OBESITY Beth Rivers is here to discuss her progress with her obesity treatment plan along with follow-up of her obesity related diagnoses. Beth Rivers is on the Category 3 Plan and states she is following her eating plan approximately 100% of the time. Beth Rivers states she is doing 0 minutes 0 times per week.  Today's visit was #: 9 Starting weight: 220 lbs Starting date: 07/11/2018 Today's weight: 199 lbs Today's date: 02/15/2020 Total lbs lost to date: 21 Total lbs lost since last in-office visit: 4  Interim History: Beth Rivers has been following the Category 3 plan, but she is not eating the bread and is substituting fruit. She noticed an increase in appetite without Saxenda and has resumed taking it. She is taking 1.8 mg daily. She has been skipping meals due to increased workload.  Subjective:   1. Essential hypertension Beth Rivers blood pressure is slightly elevated today, but her blood pressure's have been good at home. She has not taken her blood pressure medications today. Cardiovascular ROS: no chest pain or dyspnea on exertion.  BP Readings from Last 3 Encounters:  02/15/20 (!) 143/61  01/09/20 113/71  11/30/19 125/73   Lab Results  Component Value Date   CREATININE 0.67 01/09/2020   CREATININE 0.63 08/01/2019   CREATININE 0.63 04/04/2019   2. Other depression,emotional eating Beth Rivers notes numb hands and morning dizziness, and she wonders if it is the Zoloft.    Assessment/Plan:   1. Essential hypertension Beth Rivers is working on healthy weight loss and exercise to improve blood pressure control. We will watch for signs of hypotension as she continues her lifestyle modifications. We will refill triamterene-hydrochlorothiazide for 1 month.  - triamterene-hydrochlorothiazide (MAXZIDE-25) 37.5-25 MG tablet; Take 1 tablet by mouth daily.  Dispense: 30 tablet; Refill: 0  2. Other depression,emotional eating Beth Rivers agreed to decrease Zoloft to 25 mg daily to see if this  reduces hand numbness and am dizziness. - sertraline (ZOLOFT) 25 MG tablet; Take 1 tablet (25 mg total) by mouth daily.  Dispense: 30 tablet; Refill: 0   3. Class 2 severe obesity with serious comorbidity and body mass index (BMI) of 35.0 to 35.9 in adult, unspecified obesity type (HCC) Beth Rivers is currently in the action stage of change. As such, her goal is to continue with weight loss efforts. She has agreed to the Category 3 Plan.   We discussed various medication options to help Beth Rivers with her weight loss efforts and we both agreed to continue Saxenda at 1.8 mg SubQ daily, and we will refill for 1 month.  - Liraglutide -Weight Management (SAXENDA) 18 MG/3ML SOPN; Inject 0.5 mLs (3 mg total) into the skin daily.  Dispense: 5 pen; Refill: 0  Exercise goals: All adults should avoid inactivity. Some physical activity is better than none, and adults who participate in any amount of physical activity gain some health benefits.  Behavioral modification strategies: increasing lean protein intake and no skipping meals.  Beth Rivers has agreed to follow-up with our clinic in 4 weeks. She was informed of the importance of frequent follow-up visits to maximize her success with intensive lifestyle modifications for her multiple health conditions.   Objective:   Blood pressure (!) 143/61, pulse 83, temperature 98.5 F (36.9 C), temperature source Oral, height 5\' 3"  (1.6 m), weight 199 lb (90.3 kg), last menstrual period 09/22/2003, SpO2 96 %. Body mass index is 35.25 kg/m.  General: Cooperative, alert, well developed, in no acute distress. HEENT: Conjunctivae and lids unremarkable. Cardiovascular: Regular  rhythm.  Lungs: Normal work of breathing. Neurologic: No focal deficits.   Lab Results  Component Value Date   CREATININE 0.67 01/09/2020   BUN 21 01/09/2020   NA 144 01/09/2020   K 4.4 01/09/2020   CL 105 01/09/2020   CO2 25 01/09/2020   Lab Results  Component Value Date   ALT 22 01/09/2020     AST 23 01/09/2020   ALKPHOS 63 01/09/2020   BILITOT 0.3 01/09/2020   Lab Results  Component Value Date   HGBA1C 5.1 01/09/2020   HGBA1C 5.1 08/01/2019   HGBA1C 5.3 04/04/2019   HGBA1C 5.2 11/03/2018   HGBA1C 5.2 07/11/2018   Lab Results  Component Value Date   INSULIN 16.8 01/09/2020   INSULIN 14.8 08/01/2019   INSULIN 10.2 04/04/2019   INSULIN 10.6 11/03/2018   INSULIN 21.0 07/11/2018   Lab Results  Component Value Date   TSH 2.260 07/11/2018   Lab Results  Component Value Date   CHOL 230 (H) 01/09/2020   HDL 53 01/09/2020   LDLCALC 158 (H) 01/09/2020   TRIG 105 01/09/2020   CHOLHDL 5 09/27/2017   Lab Results  Component Value Date   WBC 6.2 07/11/2018   HGB 14.3 07/11/2018   HCT 42.8 07/11/2018   MCV 94 07/11/2018   PLT 253.0 09/27/2017   Lab Results  Component Value Date   IRON 65 02/26/2016   TIBC 280 02/26/2016   FERRITIN 90 02/26/2016   Attestation Statements:   Reviewed by clinician on day of visit: allergies, medications, problem list, medical history, surgical history, family history, social history, and previous encounter notes.   Wilhemena Durie, am acting as Location manager for [employer], FNP-C.  I have reviewed the above documentation for accuracy and completeness, and I agree with the above. -  Georgianne Fick, FNP

## 2020-03-12 ENCOUNTER — Ambulatory Visit (INDEPENDENT_AMBULATORY_CARE_PROVIDER_SITE_OTHER): Payer: BC Managed Care – PPO | Admitting: Family Medicine

## 2020-04-11 DIAGNOSIS — Z20822 Contact with and (suspected) exposure to covid-19: Secondary | ICD-10-CM | POA: Diagnosis not present

## 2020-04-23 ENCOUNTER — Encounter: Payer: Self-pay | Admitting: Internal Medicine

## 2020-04-23 ENCOUNTER — Other Ambulatory Visit: Payer: Self-pay

## 2020-04-23 ENCOUNTER — Encounter (INDEPENDENT_AMBULATORY_CARE_PROVIDER_SITE_OTHER): Payer: Self-pay | Admitting: Family Medicine

## 2020-04-23 ENCOUNTER — Ambulatory Visit (INDEPENDENT_AMBULATORY_CARE_PROVIDER_SITE_OTHER): Payer: BC Managed Care – PPO | Admitting: Family Medicine

## 2020-04-23 VITALS — BP 155/85 | HR 76 | Temp 98.3°F | Ht 63.0 in | Wt 219.0 lb

## 2020-04-23 DIAGNOSIS — R42 Dizziness and giddiness: Secondary | ICD-10-CM

## 2020-04-23 DIAGNOSIS — F3289 Other specified depressive episodes: Secondary | ICD-10-CM | POA: Diagnosis not present

## 2020-04-23 DIAGNOSIS — Z9189 Other specified personal risk factors, not elsewhere classified: Secondary | ICD-10-CM | POA: Diagnosis not present

## 2020-04-23 DIAGNOSIS — Z6838 Body mass index (BMI) 38.0-38.9, adult: Secondary | ICD-10-CM

## 2020-04-23 DIAGNOSIS — I10 Essential (primary) hypertension: Secondary | ICD-10-CM

## 2020-04-23 MED ORDER — TRIAMTERENE-HCTZ 37.5-25 MG PO TABS: 1.0000 | ORAL_TABLET | Freq: Every day | ORAL | 0 refills | Status: DC

## 2020-04-23 MED ORDER — SERTRALINE HCL 25 MG PO TABS: 25.0000 mg | ORAL_TABLET | Freq: Every day | ORAL | 0 refills | Status: DC

## 2020-04-24 NOTE — Progress Notes (Signed)
Chief Complaint:   OBESITY Beth Rivers is here to discuss her progress with her obesity treatment plan along with follow-up of her obesity related diagnoses. Beth Rivers is on the Category 3 Plan and states she is following her eating plan approximately 10% of the time. Beth Rivers states she is doing 0 minutes 0 times per week.  Today's visit was #: 28 Starting weight: 220 lbs Starting date: 07/11/2018 Today's weight: 219 lbs Today's date: 04/23/2020 Total lbs lost to date: 1 Total lbs lost since last in-office visit: 0  Interim History: Beth Rivers's last visit was approximately 2 months ago. She has gotten off track and has done more binge eating behaviors. She is frustrated with herself and is ready to get back on track.   Subjective:   1. Vertigo Beth Rivers notes the room spinning most mornings for 3 months. She denies recent ear infections or sinus infections.  2. Essential hypertension Beth Rivers's blood pressure is elevated today, and may be due to stress. She denies chest pain.  3. Other depression,emotional eating Beth Rivers feels her Zoloft has helped her mood overall, and she would like to stay on this.  4. At risk for heart disease Beth Rivers is at a higher than average risk for cardiovascular disease due to obesity.   Assessment/Plan:   1. Vertigo We will refer to Ear, Nose, and Throat for evaluation and will follow up. Beth Rivers is to stay hydrated.  - Ambulatory referral to ENT  2. Essential hypertension Beth Rivers is working on healthy weight loss and exercise to improve blood pressure control. We will watch for signs of hypotension as she continues her lifestyle modifications. We will refill Maxzide for 1 month, and we will recheck her blood pressure in 2 to 3 weeks.  - triamterene-hydrochlorothiazide (MAXZIDE-25) 37.5-25 MG tablet; Take 1 tablet by mouth daily.  Dispense: 30 tablet; Refill: 0  3. Other depression,emotional eating Behavior modification techniques were discussed today to help Beth Rivers deal  with her emotional/non-hunger eating behaviors. We will refill Zoloft for 1 month, but may need to adjust dose as needed. Orders and follow up as documented in patient record.   - sertraline (ZOLOFT) 25 MG tablet; Take 1 tablet (25 mg total) by mouth daily.  Dispense: 30 tablet; Refill: 0  4. At risk for heart disease Beth Rivers was given approximately 15 minutes of coronary artery disease prevention counseling today. She is 65 y.o. female and has risk factors for heart disease including obesity. We discussed intensive lifestyle modifications today with an emphasis on specific weight loss instructions and strategies.   Repetitive spaced learning was employed today to elicit superior memory formation and behavioral change.  5. Class 2 severe obesity with serious comorbidity and body mass index (BMI) of 38.0 to 38.9 in adult, unspecified obesity type (HCC) Beth Rivers is currently in the action stage of change. As such, her goal is to continue with weight loss efforts. She has agreed to change to following a lower carbohydrate, vegetable and lean protein rich diet plan.   Behavioral modification strategies: increasing lean protein intake and meal planning and cooking strategies.  Beth Rivers has agreed to follow-up with our clinic in 2 to 3 weeks. She was informed of the importance of frequent follow-up visits to maximize her success with intensive lifestyle modifications for her multiple health conditions.   Objective:   Blood pressure (!) 155/85, pulse 76, temperature 98.3 F (36.8 C), temperature source Oral, height 5\' 3"  (1.6 m), weight 219 lb (99.3 kg), last menstrual period 09/22/2003, SpO2 96 %.  Body mass index is 38.79 kg/m.  General: Cooperative, alert, well developed, in no acute distress. HEENT: Conjunctivae and lids unremarkable. Cardiovascular: Regular rhythm.  Lungs: Normal work of breathing. Neurologic: No focal deficits.   Lab Results  Component Value Date   CREATININE 0.67 01/09/2020    BUN 21 01/09/2020   NA 144 01/09/2020   K 4.4 01/09/2020   CL 105 01/09/2020   CO2 25 01/09/2020   Lab Results  Component Value Date   ALT 22 01/09/2020   AST 23 01/09/2020   ALKPHOS 63 01/09/2020   BILITOT 0.3 01/09/2020   Lab Results  Component Value Date   HGBA1C 5.1 01/09/2020   HGBA1C 5.1 08/01/2019   HGBA1C 5.3 04/04/2019   HGBA1C 5.2 11/03/2018   HGBA1C 5.2 07/11/2018   Lab Results  Component Value Date   INSULIN 16.8 01/09/2020   INSULIN 14.8 08/01/2019   INSULIN 10.2 04/04/2019   INSULIN 10.6 11/03/2018   INSULIN 21.0 07/11/2018   Lab Results  Component Value Date   TSH 2.260 07/11/2018   Lab Results  Component Value Date   CHOL 230 (H) 01/09/2020   HDL 53 01/09/2020   LDLCALC 158 (H) 01/09/2020   TRIG 105 01/09/2020   CHOLHDL 5 09/27/2017   Lab Results  Component Value Date   WBC 6.2 07/11/2018   HGB 14.3 07/11/2018   HCT 42.8 07/11/2018   MCV 94 07/11/2018   PLT 253.0 09/27/2017   Lab Results  Component Value Date   IRON 65 02/26/2016   TIBC 280 02/26/2016   FERRITIN 90 02/26/2016   Attestation Statements:   Reviewed by clinician on day of visit: allergies, medications, problem list, medical history, surgical history, family history, social history, and previous encounter notes.   I, Trixie Dredge, am acting as transcriptionist for Dennard Nip, MD.  I have reviewed the above documentation for accuracy and completeness, and I agree with the above. -  Dennard Nip, MD

## 2020-05-01 ENCOUNTER — Other Ambulatory Visit: Payer: Self-pay

## 2020-05-01 ENCOUNTER — Ambulatory Visit (INDEPENDENT_AMBULATORY_CARE_PROVIDER_SITE_OTHER): Payer: BC Managed Care – PPO | Admitting: Family Medicine

## 2020-05-01 ENCOUNTER — Encounter (INDEPENDENT_AMBULATORY_CARE_PROVIDER_SITE_OTHER): Payer: Self-pay | Admitting: Family Medicine

## 2020-05-01 VITALS — BP 108/72 | HR 79 | Temp 97.6°F | Ht 63.0 in | Wt 211.0 lb

## 2020-05-01 DIAGNOSIS — R42 Dizziness and giddiness: Secondary | ICD-10-CM

## 2020-05-01 DIAGNOSIS — Z9189 Other specified personal risk factors, not elsewhere classified: Secondary | ICD-10-CM | POA: Diagnosis not present

## 2020-05-01 DIAGNOSIS — I10 Essential (primary) hypertension: Secondary | ICD-10-CM

## 2020-05-01 DIAGNOSIS — F3289 Other specified depressive episodes: Secondary | ICD-10-CM | POA: Diagnosis not present

## 2020-05-01 DIAGNOSIS — E66812 Obesity, class 2: Secondary | ICD-10-CM

## 2020-05-01 DIAGNOSIS — Z6837 Body mass index (BMI) 37.0-37.9, adult: Secondary | ICD-10-CM

## 2020-05-01 MED ORDER — SERTRALINE HCL 25 MG PO TABS: 25.0000 mg | ORAL_TABLET | Freq: Every day | ORAL | 0 refills | Status: DC

## 2020-05-01 MED ORDER — TRIAMTERENE-HCTZ 37.5-25 MG PO TABS: 1.0000 | ORAL_TABLET | Freq: Every day | ORAL | 0 refills | Status: DC

## 2020-05-02 ENCOUNTER — Encounter (INDEPENDENT_AMBULATORY_CARE_PROVIDER_SITE_OTHER): Payer: Self-pay

## 2020-05-02 LAB — COMPREHENSIVE METABOLIC PANEL
ALT: 15 IU/L (ref 0–32)
AST: 15 IU/L (ref 0–40)
Albumin/Globulin Ratio: 1.5 (ref 1.2–2.2)
Albumin: 4.4 g/dL (ref 3.8–4.8)
Alkaline Phosphatase: 76 IU/L (ref 48–121)
BUN/Creatinine Ratio: 43 — ABNORMAL HIGH (ref 12–28)
BUN: 29 mg/dL — ABNORMAL HIGH (ref 8–27)
Bilirubin Total: 0.3 mg/dL (ref 0.0–1.2)
CO2: 24 mmol/L (ref 20–29)
Calcium: 9.3 mg/dL (ref 8.7–10.3)
Chloride: 103 mmol/L (ref 96–106)
Creatinine, Ser: 0.67 mg/dL (ref 0.57–1.00)
GFR calc Af Amer: 108 mL/min/{1.73_m2} (ref >59)
GFR calc non Af Amer: 94 mL/min/{1.73_m2} (ref >59)
Globulin, Total: 3 g/dL (ref 1.5–4.5)
Glucose: 94 mg/dL (ref 65–99)
Potassium: 4.3 mmol/L (ref 3.5–5.2)
Sodium: 142 mmol/L (ref 134–144)
Total Protein: 7.4 g/dL (ref 6.0–8.5)

## 2020-05-02 LAB — LIPID PANEL WITH LDL/HDL RATIO
Cholesterol, Total: 227 mg/dL — ABNORMAL HIGH (ref 100–199)
HDL: 51 mg/dL (ref >39)
LDL Chol Calc (NIH): 160 mg/dL — ABNORMAL HIGH (ref 0–99)
LDL/HDL Ratio: 3.1 ratio (ref 0.0–3.2)
Triglycerides: 91 mg/dL (ref 0–149)
VLDL Cholesterol Cal: 16 mg/dL (ref 5–40)

## 2020-05-02 LAB — INSULIN, RANDOM: INSULIN: 11.4 u[IU]/mL (ref 2.6–24.9)

## 2020-05-02 LAB — HEMOGLOBIN A1C
Est. average glucose Bld gHb Est-mCnc: 103 mg/dL
Hgb A1c MFr Bld: 5.2 % (ref 4.8–5.6)

## 2020-05-02 LAB — VITAMIN D 25 HYDROXY (VIT D DEFICIENCY, FRACTURES): Vit D, 25-Hydroxy: 53.1 ng/mL (ref 30.0–100.0)

## 2020-05-02 NOTE — Progress Notes (Signed)
Chief Complaint:   OBESITY Beth Rivers is here to discuss her progress with her obesity treatment plan along with follow-up of her obesity related diagnoses. Beth Rivers is on following a lower carbohydrate, vegetable and lean protein rich diet plan and states she is following her eating plan approximately 100% of the time. Beth Rivers states she is doing 0 minutes 0 times per week.  Today's visit was #: 26 Starting weight: 220 lbs Starting date: 07/11/2018 Today's weight: 211 lbs Today's date: 05/01/2020 Total lbs lost to date: 9 Total lbs lost since last in-office visit: 8  Interim History: Anjuli continues to do well with weight loss on her lower carbohydrate plan. Her hunger is controlled and she is happy with her weight loss. She would like to continue this for another 2-3 weeks.  Subjective:   1. Vertigo Beth Rivers was referred to Ear, Nose, and Throat last visit, but she has not been contacted yet. Her vertigo has improved but is not completely resolved.  2. Other depression with emotional eating Beth Rivers is stable on her medications, and she denies nausea or vomiting.  3. Essential hypertension Beth Rivers's blood pressure is well controlled on her medications, and she is due for labs.  4. At risk for dehydration Beth Rivers is at risk for dehydration due to inadequate water intake.  Assessment/Plan:   1. Vertigo We will contact Ear, Nose, and Throat office to follow up on the referral placed at Santiaga's last visit.  2. Other depression with emotional eating Behavior modification techniques were discussed today to help Mariaha deal with her emotional/non-hunger eating behaviors. We will refill Zoloft for 1 month. Orders and follow up as documented in patient record.   - sertraline (ZOLOFT) 25 MG tablet; Take 1 tablet (25 mg total) by mouth daily.  Dispense: 30 tablet; Refill: 0  3. Essential hypertension Salley is working on healthy weight loss and exercise to improve blood pressure control. We will  watch for signs of hypotension as she continues her lifestyle modifications. We will check labs today, and we will refill Maxzide for 1 month.  - triamterene-hydrochlorothiazide (MAXZIDE-25) 37.5-25 MG tablet; Take 1 tablet by mouth daily.  Dispense: 30 tablet; Refill: 0 - Comprehensive metabolic panel - Hemoglobin A1c - Insulin, random - Lipid Panel With LDL/HDL Ratio - VITAMIN D 25 Hydroxy (Vit-D Deficiency, Fractures)  4. At risk for dehydration Sloan was given approximately 15 minutes dehydration prevention counseling today. Aeriel is at risk for dehydration due to weight loss and current medication(s). She was encouraged to hydrate and monitor fluid status to avoid dehydration as well as weight loss plateaus.   5. Class 2 severe obesity with serious comorbidity and body mass index (BMI) of 37.0 to 37.9 in adult, unspecified obesity type (HCC) Beth Rivers is currently in the action stage of change. As such, her goal is to continue with weight loss efforts. She has agreed to following a lower carbohydrate, vegetable and lean protein rich diet plan.   Behavioral modification strategies: increasing water intake.  Beth Rivers has agreed to follow-up with our clinic in 3 weeks. She was informed of the importance of frequent follow-up visits to maximize her success with intensive lifestyle modifications for her multiple health conditions.   Beth Rivers was informed we would discuss her lab results at her next visit unless there is a critical issue that needs to be addressed sooner. Beth Rivers agreed to keep her next visit at the agreed upon time to discuss these results.  Objective:   Blood pressure 108/72, pulse  79, temperature 97.6 F (36.4 C), temperature source Oral, height 5\' 3"  (1.6 m), weight 211 lb (95.7 kg), last menstrual period 09/22/2003, SpO2 96 %. Body mass index is 37.38 kg/m.  General: Cooperative, alert, well developed, in no acute distress. HEENT: Conjunctivae and lids  unremarkable. Cardiovascular: Regular rhythm.  Lungs: Normal work of breathing. Neurologic: No focal deficits.   Lab Results  Component Value Date   CREATININE 0.67 05/01/2020   BUN 29 (H) 05/01/2020   NA 142 05/01/2020   K 4.3 05/01/2020   CL 103 05/01/2020   CO2 24 05/01/2020   Lab Results  Component Value Date   ALT 15 05/01/2020   AST 15 05/01/2020   ALKPHOS 76 05/01/2020   BILITOT 0.3 05/01/2020   Lab Results  Component Value Date   HGBA1C 5.2 05/01/2020   HGBA1C 5.1 01/09/2020   HGBA1C 5.1 08/01/2019   HGBA1C 5.3 04/04/2019   HGBA1C 5.2 11/03/2018   Lab Results  Component Value Date   INSULIN 11.4 05/01/2020   INSULIN 16.8 01/09/2020   INSULIN 14.8 08/01/2019   INSULIN 10.2 04/04/2019   INSULIN 10.6 11/03/2018   Lab Results  Component Value Date   TSH 2.260 07/11/2018   Lab Results  Component Value Date   CHOL 227 (H) 05/01/2020   HDL 51 05/01/2020   LDLCALC 160 (H) 05/01/2020   TRIG 91 05/01/2020   CHOLHDL 5 09/27/2017   Lab Results  Component Value Date   WBC 6.2 07/11/2018   HGB 14.3 07/11/2018   HCT 42.8 07/11/2018   MCV 94 07/11/2018   PLT 253.0 09/27/2017   Lab Results  Component Value Date   IRON 65 02/26/2016   TIBC 280 02/26/2016   FERRITIN 90 02/26/2016   Attestation Statements:   Reviewed by clinician on day of visit: allergies, medications, problem list, medical history, surgical history, family history, social history, and previous encounter notes.    I, Trixie Dredge, am acting as transcriptionist for Dennard Nip, MD.  I have reviewed the above documentation for accuracy and completeness, and I agree with the above. -  Dennard Nip, MD

## 2020-05-10 DIAGNOSIS — Z20822 Contact with and (suspected) exposure to covid-19: Secondary | ICD-10-CM | POA: Diagnosis not present

## 2020-05-21 ENCOUNTER — Ambulatory Visit (INDEPENDENT_AMBULATORY_CARE_PROVIDER_SITE_OTHER): Payer: Self-pay | Admitting: Family Medicine

## 2020-05-22 ENCOUNTER — Ambulatory Visit (INDEPENDENT_AMBULATORY_CARE_PROVIDER_SITE_OTHER): Payer: BC Managed Care – PPO | Admitting: Family Medicine

## 2020-05-22 ENCOUNTER — Other Ambulatory Visit: Payer: Self-pay

## 2020-05-22 ENCOUNTER — Encounter (INDEPENDENT_AMBULATORY_CARE_PROVIDER_SITE_OTHER): Payer: Self-pay | Admitting: Family Medicine

## 2020-05-22 VITALS — BP 118/73 | HR 74 | Temp 98.0°F | Ht 63.0 in | Wt 213.0 lb

## 2020-05-22 DIAGNOSIS — F3289 Other specified depressive episodes: Secondary | ICD-10-CM

## 2020-05-22 DIAGNOSIS — I10 Essential (primary) hypertension: Secondary | ICD-10-CM

## 2020-05-22 DIAGNOSIS — Z6837 Body mass index (BMI) 37.0-37.9, adult: Secondary | ICD-10-CM

## 2020-05-22 DIAGNOSIS — Z9189 Other specified personal risk factors, not elsewhere classified: Secondary | ICD-10-CM

## 2020-05-22 MED ORDER — SERTRALINE HCL 25 MG PO TABS: 25.0000 mg | ORAL_TABLET | Freq: Every day | ORAL | 0 refills | Status: DC

## 2020-05-22 MED ORDER — TRIAMTERENE-HCTZ 37.5-25 MG PO TABS: 1.0000 | ORAL_TABLET | Freq: Every day | ORAL | 0 refills | Status: AC

## 2020-05-22 NOTE — Progress Notes (Signed)
Chief Complaint:   OBESITY Beth Rivers is here to discuss her progress with her obesity treatment plan along with follow-up of her obesity related diagnoses. Beth Rivers is on following a lower carbohydrate, vegetable and lean protein rich diet plan and states she is following her eating plan approximately 100% of the time. Beth Rivers states she is doing 0 minutes 0 times per week.  Today's visit was #: 30 Starting weight: 220 lbs Starting date: 07/02/2018 Today's weight: 213 lbs Today's date: 05/22/2020 Total lbs lost to date: 7 Total lbs lost since last in-office visit: 0  Interim History: Beth Rivers has done well with the Low carbohydrate plan. She notes feeling less bloated and her clothes are fitting better. She is retaining fluid today, which may be due to increased Na+ last night but she could also be in the fluid retention phase of her fat burning. She notes some hunger at times.  Subjective:   1. Essential hypertension Noora's blood pressure is well controlled. She is retaining a small amount of water weight today.  2. Other depression with emotional eating Beth Rivers is stable on Zoloft, and she requests a refill today.  3. At risk for side effect of medication Beth Rivers is at risk for drug side effects due to restarting Saxenda.  Assessment/Plan:   1. Essential hypertension Beth Rivers is working on healthy weight loss and exercise to improve blood pressure control. We will watch for signs of hypotension as she continues her lifestyle modifications. We will refill Maxzide for 1 month. Beth Rivers will continue to increase her water intake and will continue to monitor.  - triamterene-hydrochlorothiazide (MAXZIDE-25) 37.5-25 MG tablet; Take 1 tablet by mouth daily.  Dispense: 30 tablet; Refill: 0  2. Other depression with emotional eating Behavior modification techniques were discussed today to help Beth Rivers deal with her emotional/non-hunger eating behaviors. We will refill Zoloft for 1 month, and will continue  to monitor closely. Orders and follow up as documented in patient record.   - sertraline (ZOLOFT) 25 MG tablet; Take 1 tablet (25 mg total) by mouth daily.  Dispense: 30 tablet; Refill: 0  3. At risk for side effect of medication Beth Rivers was given approximately 15 minutes of drug side effect counseling today. We discussed side effect possibility and risk versus benefits. Beth Rivers agreed to the medication and will contact this office if these side effects are intolerable.  Repetitive spaced learning was employed today to elicit superior memory formation and behavioral change.  4. Class 2 severe obesity with serious comorbidity and body mass index (BMI) of 37.0 to 37.9 in adult, unspecified obesity type (HCC) Beth Rivers is currently in the action stage of change. As such, her goal is to continue with weight loss efforts. She has agreed to following a lower carbohydrate, vegetable and lean protein rich diet plan.   We discussed various medication options to help Beth Rivers with her weight loss efforts and we both agreed to restart Saxenda at 0.6 mg, no refill needed.  Behavioral modification strategies: increasing lean protein intake, increasing water intake and decreasing sodium intake.  Beth Rivers has agreed to follow-up with our clinic in 2 weeks. She was informed of the importance of frequent follow-up visits to maximize her success with intensive lifestyle modifications for her multiple health conditions.   Objective:   Blood pressure 118/73, pulse 74, temperature 98 F (36.7 C), height 5\' 3"  (1.6 m), weight 213 lb (96.6 kg), last menstrual period 09/22/2003, SpO2 96 %. Body mass index is 37.73 kg/m.  General: Cooperative, alert,  well developed, in no acute distress. HEENT: Conjunctivae and lids unremarkable. Cardiovascular: Regular rhythm.  Lungs: Normal work of breathing. Neurologic: No focal deficits.   Lab Results  Component Value Date   CREATININE 0.67 05/01/2020   BUN 29 (H) 05/01/2020   NA  142 05/01/2020   K 4.3 05/01/2020   CL 103 05/01/2020   CO2 24 05/01/2020   Lab Results  Component Value Date   ALT 15 05/01/2020   AST 15 05/01/2020   ALKPHOS 76 05/01/2020   BILITOT 0.3 05/01/2020   Lab Results  Component Value Date   HGBA1C 5.2 05/01/2020   HGBA1C 5.1 01/09/2020   HGBA1C 5.1 08/01/2019   HGBA1C 5.3 04/04/2019   HGBA1C 5.2 11/03/2018   Lab Results  Component Value Date   INSULIN 11.4 05/01/2020   INSULIN 16.8 01/09/2020   INSULIN 14.8 08/01/2019   INSULIN 10.2 04/04/2019   INSULIN 10.6 11/03/2018   Lab Results  Component Value Date   TSH 2.260 07/11/2018   Lab Results  Component Value Date   CHOL 227 (H) 05/01/2020   HDL 51 05/01/2020   LDLCALC 160 (H) 05/01/2020   TRIG 91 05/01/2020   CHOLHDL 5 09/27/2017   Lab Results  Component Value Date   WBC 6.2 07/11/2018   HGB 14.3 07/11/2018   HCT 42.8 07/11/2018   MCV 94 07/11/2018   PLT 253.0 09/27/2017   Lab Results  Component Value Date   IRON 65 02/26/2016   TIBC 280 02/26/2016   FERRITIN 90 02/26/2016   Attestation Statements:   Reviewed by clinician on day of visit: allergies, medications, problem list, medical history, surgical history, family history, social history, and previous encounter notes.   I, Trixie Dredge, am acting as transcriptionist for Dennard Nip, MD.  I have reviewed the above documentation for accuracy and completeness, and I agree with the above. -  Dennard Nip, MD

## 2020-06-03 ENCOUNTER — Ambulatory Visit (INDEPENDENT_AMBULATORY_CARE_PROVIDER_SITE_OTHER): Payer: BC Managed Care – PPO | Admitting: Family Medicine

## 2020-06-03 ENCOUNTER — Other Ambulatory Visit: Payer: Self-pay

## 2020-06-03 ENCOUNTER — Ambulatory Visit (INDEPENDENT_AMBULATORY_CARE_PROVIDER_SITE_OTHER): Payer: BC Managed Care – PPO | Admitting: Otolaryngology

## 2020-06-03 ENCOUNTER — Encounter (INDEPENDENT_AMBULATORY_CARE_PROVIDER_SITE_OTHER): Payer: Self-pay | Admitting: Otolaryngology

## 2020-06-03 VITALS — Temp 97.3°F

## 2020-06-03 DIAGNOSIS — R42 Dizziness and giddiness: Secondary | ICD-10-CM

## 2020-06-03 NOTE — Progress Notes (Signed)
HPI: Beth Rivers is a 64 y.o. female who presents is referred by her PCP for evaluation of chronic dizzy spells that she has had for the past 8 months. She wears CPAP at night. When she wakes up in the mornings she frequently feels dizzy and takes several minutes before she can get up and walk well. Symptoms are generally worse in the mornings when she first gets up. She has had them occasionally occur during the daytime. She has not noticed any change in her hearing when she has the dizziness. She doesn't really describe true vertigo although she does feel like she is moving when she has a dizzy episodes. She has been on medication for weight loss as well some medication for depression and hypertension. She does not smoke.  Past Medical History:  Diagnosis Date  . Anxiety   . Depression 09/2001  . Elevated BP   . Elevated LDL cholesterol level 1/14  . Feeling lonely   . History of uterine fibroid 11/2002  . HTN (hypertension)   . Joint pain   . Leg edema   . Obesity   . Obstructive sleep apnea    Past Surgical History:  Procedure Laterality Date  . [place]     Social History   Socioeconomic History  . Marital status: Legally Separated    Spouse name: Not on file  . Number of children: Not on file  . Years of education: Not on file  . Highest education level: Not on file  Occupational History  . Occupation: Naval architect  Tobacco Use  . Smoking status: Never Smoker  . Smokeless tobacco: Never Used  Vaping Use  . Vaping Use: Never used  Substance and Sexual Activity  . Alcohol use: Not Currently  . Drug use: No  . Sexual activity: Not Currently    Partners: Male    Birth control/protection: Abstinence  Other Topics Concern  . Not on file  Social History Narrative  . Not on file   Social Determinants of Health   Financial Resource Strain:   . Difficulty of Paying Living Expenses: Not on file  Food Insecurity:   . Worried About Sales executive in the Last Year: Not on file  . Ran Out of Food in the Last Year: Not on file  Transportation Needs:   . Lack of Transportation (Medical): Not on file  . Lack of Transportation (Non-Medical): Not on file  Physical Activity:   . Days of Exercise per Week: Not on file  . Minutes of Exercise per Session: Not on file  Stress:   . Feeling of Stress : Not on file  Social Connections:   . Frequency of Communication with Friends and Family: Not on file  . Frequency of Social Gatherings with Friends and Family: Not on file  . Attends Religious Services: Not on file  . Active Member of Clubs or Organizations: Not on file  . Attends Archivist Meetings: Not on file  . Marital Status: Not on file   Family History  Problem Relation Age of Onset  . Hypertension Mother   . Hyperlipidemia Mother   . Depression Mother   . Obesity Mother   . Hypertension Father   . Heart attack Father   . Obesity Father   . Hyperlipidemia Father   . Heart disease Father   . Sudden death Daughter   . Colon cancer Maternal Grandmother    No Known Allergies Prior to Admission medications  Medication Sig Start Date End Date Taking? Authorizing Provider  sertraline (ZOLOFT) 25 MG tablet Take 1 tablet (25 mg total) by mouth daily. 05/22/20  Yes Beasley, Caren D, MD  triamterene-hydrochlorothiazide (MAXZIDE-25) 37.5-25 MG tablet Take 1 tablet by mouth daily. 05/22/20  Yes Beasley, Caren D, MD     Positive ROS: Otherwise negative  All other systems have been reviewed and were otherwise negative with the exception of those mentioned in the HPI and as above.  Physical Exam: Constitutional: Alert, well-appearing, no acute distress Ears: External ears without lesions or tenderness. Ear canals are clear bilaterally with intact, clear TMs bilaterally. On Dix-Hallpike testing she had no clinical evidence of BPPV. On hearing screening with a 512 1024 tuning fork she heard well in both ears. Nasal: External  nose without lesions. Septum midline with mild rhinitis. Clear nasal passages otherwise with no signs of infection. Oral: Lips and gums without lesions. Tongue and palate mucosa without lesions. Posterior oropharynx clear. Neck: No palpable adenopathy or masses Respiratory: Breathing comfortably  Skin: No facial/neck lesions or rash noted.  Procedures  Assessment: Chronic dizzy spells questionable etiology. No clinical evidence of BPPV. No evidence of Mnire's disease. I doubt this is related to vestibular abnormality.  Plan: We will go ahead and schedule VNG testing and audiologic testing to evaluate vestibular function. Patient will either follow-up or call us following the VNG testing audiologic testing.   Radene Journey, MD   CC:

## 2020-06-05 ENCOUNTER — Other Ambulatory Visit: Payer: Self-pay

## 2020-06-05 ENCOUNTER — Encounter (INDEPENDENT_AMBULATORY_CARE_PROVIDER_SITE_OTHER): Payer: Self-pay | Admitting: Family Medicine

## 2020-06-05 ENCOUNTER — Ambulatory Visit (INDEPENDENT_AMBULATORY_CARE_PROVIDER_SITE_OTHER): Payer: BC Managed Care – PPO | Admitting: Family Medicine

## 2020-06-05 VITALS — BP 126/80 | HR 94 | Temp 97.9°F | Ht 63.0 in | Wt 209.0 lb

## 2020-06-05 DIAGNOSIS — I10 Essential (primary) hypertension: Secondary | ICD-10-CM | POA: Diagnosis not present

## 2020-06-05 DIAGNOSIS — Z6837 Body mass index (BMI) 37.0-37.9, adult: Secondary | ICD-10-CM

## 2020-06-06 NOTE — Progress Notes (Signed)
Chief Complaint:   OBESITY Beth Rivers is here to discuss her progress with her obesity treatment plan along with follow-up of her obesity related diagnoses. Beth Rivers is on following a lower carbohydrate, vegetable and lean protein rich diet plan and states she is following her eating plan approximately 100% of the time. Beth Rivers states she is doing 0 minutes 0 times per week.  Today's visit was #: 17 Starting weight: 220 lbs Starting date: 07/02/2018 Today's weight: 209 lbs Today's date: 06/05/2020 Total lbs lost to date: 11 Total lbs lost since last in-office visit: 4  Interim History: Beth Rivers has done very well with the Low carbohydrate plan, and Saxenda at 0.6 mg. Her hunger is controlled and she denies feeling deprived. She would like to continue this plan for a while longer.  Subjective:   1. Essential hypertension Beth Rivers's blood pressure remains well controlled, and she denies chest pain or headache.  Assessment/Plan:   1. Essential hypertension Beth Rivers will continue with healthy weight loss, diet, and exercise to improve blood pressure control. We will watch for signs of hypotension as she continues her lifestyle modifications.  2. Class 2 severe obesity with serious comorbidity and body mass index (BMI) of 37.0 to 37.9 in adult, unspecified obesity type (HCC) Beth Rivers is currently in the action stage of change. As such, her goal is to continue with weight loss efforts. She has agreed to following a lower carbohydrate, vegetable and lean protein rich diet plan.   Behavioral modification strategies: increasing lean protein intake.  Beth Rivers has agreed to follow-up with our clinic in 4 weeks. She was informed of the importance of frequent follow-up visits to maximize her success with intensive lifestyle modifications for her multiple health conditions.   Objective:   Blood pressure 126/80, pulse 94, temperature 97.9 F (36.6 C), height 5\' 3"  (1.6 m), weight 209 lb (94.8 kg), last menstrual  period 09/22/2003, SpO2 94 %. Body mass index is 37.02 kg/m.  General: Cooperative, alert, well developed, in no acute distress. HEENT: Conjunctivae and lids unremarkable. Cardiovascular: Regular rhythm.  Lungs: Normal work of breathing. Neurologic: No focal deficits.   Lab Results  Component Value Date   CREATININE 0.67 05/01/2020   BUN 29 (H) 05/01/2020   NA 142 05/01/2020   K 4.3 05/01/2020   CL 103 05/01/2020   CO2 24 05/01/2020   Lab Results  Component Value Date   ALT 15 05/01/2020   AST 15 05/01/2020   ALKPHOS 76 05/01/2020   BILITOT 0.3 05/01/2020   Lab Results  Component Value Date   HGBA1C 5.2 05/01/2020   HGBA1C 5.1 01/09/2020   HGBA1C 5.1 08/01/2019   HGBA1C 5.3 04/04/2019   HGBA1C 5.2 11/03/2018   Lab Results  Component Value Date   INSULIN 11.4 05/01/2020   INSULIN 16.8 01/09/2020   INSULIN 14.8 08/01/2019   INSULIN 10.2 04/04/2019   INSULIN 10.6 11/03/2018   Lab Results  Component Value Date   TSH 2.260 07/11/2018   Lab Results  Component Value Date   CHOL 227 (H) 05/01/2020   HDL 51 05/01/2020   LDLCALC 160 (H) 05/01/2020   TRIG 91 05/01/2020   CHOLHDL 5 09/27/2017   Lab Results  Component Value Date   WBC 6.2 07/11/2018   HGB 14.3 07/11/2018   HCT 42.8 07/11/2018   MCV 94 07/11/2018   PLT 253.0 09/27/2017   Lab Results  Component Value Date   IRON 65 02/26/2016   TIBC 280 02/26/2016   FERRITIN 90 02/26/2016  Attestation Statements:   Reviewed by clinician on day of visit: allergies, medications, problem list, medical history, surgical history, family history, social history, and previous encounter notes.  Time spent on visit including pre-visit chart review and post-visit care and charting was 20 minutes.    I, Trixie Dredge, am acting as transcriptionist for Dennard Nip, MD.  I have reviewed the above documentation for accuracy and completeness, and I agree with the above. -  Dennard Nip, MD

## 2020-06-07 DIAGNOSIS — R635 Abnormal weight gain: Secondary | ICD-10-CM | POA: Diagnosis not present

## 2020-06-07 DIAGNOSIS — E782 Mixed hyperlipidemia: Secondary | ICD-10-CM | POA: Diagnosis not present

## 2020-06-07 DIAGNOSIS — G4730 Sleep apnea, unspecified: Secondary | ICD-10-CM | POA: Diagnosis not present

## 2020-06-29 DIAGNOSIS — Z20822 Contact with and (suspected) exposure to covid-19: Secondary | ICD-10-CM | POA: Diagnosis not present

## 2020-07-05 DIAGNOSIS — E559 Vitamin D deficiency, unspecified: Secondary | ICD-10-CM | POA: Diagnosis not present

## 2020-07-05 DIAGNOSIS — I10 Essential (primary) hypertension: Secondary | ICD-10-CM | POA: Diagnosis not present

## 2020-07-05 DIAGNOSIS — R5383 Other fatigue: Secondary | ICD-10-CM | POA: Diagnosis not present

## 2020-07-05 DIAGNOSIS — F32A Depression, unspecified: Secondary | ICD-10-CM | POA: Diagnosis not present

## 2020-07-05 DIAGNOSIS — E785 Hyperlipidemia, unspecified: Secondary | ICD-10-CM | POA: Diagnosis not present

## 2020-07-08 ENCOUNTER — Ambulatory Visit (INDEPENDENT_AMBULATORY_CARE_PROVIDER_SITE_OTHER): Payer: BC Managed Care – PPO | Admitting: Family Medicine

## 2020-08-05 DIAGNOSIS — E782 Mixed hyperlipidemia: Secondary | ICD-10-CM | POA: Diagnosis not present

## 2020-08-05 DIAGNOSIS — F32A Depression, unspecified: Secondary | ICD-10-CM | POA: Diagnosis not present

## 2020-08-05 DIAGNOSIS — R635 Abnormal weight gain: Secondary | ICD-10-CM | POA: Diagnosis not present

## 2020-08-05 DIAGNOSIS — T7840XA Allergy, unspecified, initial encounter: Secondary | ICD-10-CM | POA: Diagnosis not present

## 2020-09-04 DIAGNOSIS — E7800 Pure hypercholesterolemia, unspecified: Secondary | ICD-10-CM | POA: Diagnosis not present

## 2020-09-04 DIAGNOSIS — R635 Abnormal weight gain: Secondary | ICD-10-CM | POA: Diagnosis not present

## 2020-09-04 DIAGNOSIS — I10 Essential (primary) hypertension: Secondary | ICD-10-CM | POA: Diagnosis not present

## 2020-09-04 DIAGNOSIS — T7840XA Allergy, unspecified, initial encounter: Secondary | ICD-10-CM | POA: Diagnosis not present

## 2020-09-04 DIAGNOSIS — Z79899 Other long term (current) drug therapy: Secondary | ICD-10-CM | POA: Diagnosis not present

## 2020-10-07 DIAGNOSIS — Z20822 Contact with and (suspected) exposure to covid-19: Secondary | ICD-10-CM | POA: Diagnosis not present

## 2020-11-04 ENCOUNTER — Ambulatory Visit (INDEPENDENT_AMBULATORY_CARE_PROVIDER_SITE_OTHER): Payer: BC Managed Care – PPO | Admitting: Physician Assistant

## 2020-11-04 ENCOUNTER — Other Ambulatory Visit: Payer: Self-pay

## 2020-11-04 ENCOUNTER — Encounter: Payer: Self-pay | Admitting: Orthopedic Surgery

## 2020-11-04 ENCOUNTER — Ambulatory Visit: Payer: Self-pay

## 2020-11-04 VITALS — Ht 63.0 in | Wt 193.0 lb

## 2020-11-04 DIAGNOSIS — M25562 Pain in left knee: Secondary | ICD-10-CM

## 2020-11-04 NOTE — Progress Notes (Signed)
Office Visit Note   Patient: Beth Rivers           Date of Birth: [birth year]           MRN: [id number] Visit Date: 11/04/2020              Requested by: Hoyt Koch, MD [address] [place],  [place] [address] PCP: Hoyt Koch, MD  Chief Complaint  Patient presents with  . Left Knee - Pain      HPI: Patient presents today with a 1 week history of fall onto her left knee.  She has had a history of early arthritis in her knee in the past.  She said when it first happened she had significant swelling but no bruising.  She had difficulty placing weight on her leg.  She said in the last week it is gotten much better and she almost canceled her appointment.  She denies any instability Assessment & Plan: Visit Diagnoses:  1. Acute pain of left knee     Plan: Findings consistent with contusion injury at area of patellar tendon insertion in the infrapatellar.  Should continue with symptomatic treatment I applying Voltaren gel.  Should work on quad strengthening and gentle range of motion follow-up in 2 weeks if not significantly better.  Follow-Up Instructions: No follow-ups on file.   Ortho Exam  Patient is alert, oriented, no adenopathy, well-dressed, normal affect, normal respiratory effort. Left knee demonstrates no effusion minimal soft tissue swelling no ecchymosis.  She is tender at the inferior pole of the patella at the insertion of the patellar tendon.  Patella tendon is intact and she has some reproduction of pain with straight leg raising but is able to do that as well as extension.  No specific tenderness over medial lateral joint line  Imaging: XR Knee 1-2 Views Left  Result Date: 11/04/2020 2 views of her knee were taken today.  She does have some early degenerative changes but no acute osseous injuries well-maintained alignment  No images are attached to the encounter.  Labs: Lab Results  Component Value Date   HGBA1C 5.2 05/01/2020    HGBA1C 5.1 01/09/2020   HGBA1C 5.1 08/01/2019   LABORGA NO GROWTH 01/25/2015     Lab Results  Component Value Date   ALBUMIN 4.4 05/01/2020   ALBUMIN 4.2 01/09/2020   ALBUMIN 4.3 08/01/2019    No results found for: MG Lab Results  Component Value Date   VD25OH 53.1 05/01/2020   VD25OH 64.5 01/09/2020   VD25OH 55.6 08/01/2019    No results found for: PREALBUMIN CBC EXTENDED Latest Ref Rng & Units 07/11/2018 09/27/2017 02/26/2016  WBC 3.4 - 10.8 x10E3/uL 6.2 9.9 6.2  RBC 3.77 - 5.28 x10E6/uL 4.56 4.90 4.88  HGB 11.1 - 15.9 g/dL 14.3 15.9(H) 15.9  HCT 34.0 - 46.6 % 42.8 46.3(H) 45.5  PLT 150.0 - 400.0 K/uL - 253.0 236  NEUTROABS 1.4 - 7.0 x10E3/uL 3.9 - 3.8  LYMPHSABS 0.7 - 3.1 x10E3/uL 1.7 - 1.7     Body mass index is 34.19 kg/m.  Orders:  Orders Placed This Encounter  Procedures  . XR Knee 1-2 Views Left   No orders of the defined types were placed in this encounter.    Procedures: No procedures performed  Clinical Data: No additional findings.  ROS:  All other systems negative, except as noted in the HPI. Review of Systems  Objective: Vital Signs: Ht 5\' 3"  (1.6 m)   Wt 193  lb (87.5 kg)   LMP 09/22/2003   BMI 34.19 kg/m   Specialty Comments:  No specialty comments available.  PMFS History: Patient Active Problem List   Diagnosis Date Noted  . Family history of early CAD 10/20/2019  . Other hyperlipidemia 08/08/2019  . Insulin resistance 02/16/2019  . Vitamin D deficiency 01/09/2019  . Numbness and tingling in left hand 01/09/2019  . Routine general medical examination at a health care facility 09/28/2017  . Obesity 11/22/2015  . Essential hypertension 04/30/2015  . Depression 11/27/2013  . Fibroids 11/27/2013   Past Medical History:  Diagnosis Date  . Anxiety   . Depression 09/2001  . Elevated BP   . Elevated LDL cholesterol level 1/14  . Feeling lonely   . History of uterine fibroid 11/2002  . HTN (hypertension)   . Joint pain   . Leg  edema   . Obesity   . Obstructive sleep apnea     Family History  Problem Relation Age of Onset  . Hypertension Mother   . Hyperlipidemia Mother   . Depression Mother   . Obesity Mother   . Hypertension Father   . Heart attack Father   . Obesity Father   . Hyperlipidemia Father   . Heart disease Father   . Sudden death Daughter   . Colon cancer Maternal Grandmother     Past Surgical History:  Procedure Laterality Date  . [place]     Social History   Occupational History  . Occupation: Naval architect  Tobacco Use  . Smoking status: Never Smoker  . Smokeless tobacco: Never Used  Vaping Use  . Vaping Use: Never used  Substance and Sexual Activity  . Alcohol use: Not Currently  . Drug use: No  . Sexual activity: Not Currently    Partners: Male    Birth control/protection: Abstinence

## 2020-12-12 ENCOUNTER — Ambulatory Visit: Payer: Self-pay

## 2020-12-13 ENCOUNTER — Other Ambulatory Visit: Payer: Self-pay | Admitting: Obstetrics & Gynecology

## 2020-12-13 DIAGNOSIS — Z1231 Encounter for screening mammogram for malignant neoplasm of breast: Secondary | ICD-10-CM

## 2020-12-19 ENCOUNTER — Other Ambulatory Visit (HOSPITAL_BASED_OUTPATIENT_CLINIC_OR_DEPARTMENT_OTHER): Payer: Self-pay | Admitting: Obstetrics & Gynecology

## 2020-12-19 ENCOUNTER — Other Ambulatory Visit: Payer: Self-pay

## 2020-12-19 ENCOUNTER — Encounter (HOSPITAL_BASED_OUTPATIENT_CLINIC_OR_DEPARTMENT_OTHER): Payer: Self-pay | Admitting: Obstetrics & Gynecology

## 2020-12-19 ENCOUNTER — Ambulatory Visit (INDEPENDENT_AMBULATORY_CARE_PROVIDER_SITE_OTHER): Payer: BC Managed Care – PPO | Admitting: Obstetrics & Gynecology

## 2020-12-19 ENCOUNTER — Ambulatory Visit (HOSPITAL_BASED_OUTPATIENT_CLINIC_OR_DEPARTMENT_OTHER)
Admission: RE | Admit: 2020-12-19 | Discharge: 2020-12-19 | Disposition: A | Discharge status: Home / Self Care | Payer: BC Managed Care – PPO | Source: Ambulatory Visit | Attending: Obstetrics & Gynecology | Admitting: Obstetrics & Gynecology

## 2020-12-19 ENCOUNTER — Other Ambulatory Visit (HOSPITAL_COMMUNITY)
Admission: RE | Admit: 2020-12-19 | Discharge: 2020-12-19 | Disposition: A | Discharge status: Home / Self Care | Payer: BC Managed Care – PPO | Source: Ambulatory Visit | Attending: Obstetrics & Gynecology | Admitting: Obstetrics & Gynecology

## 2020-12-19 VITALS — BP 147/84 | HR 70 | Ht 63.0 in | Wt 208.0 lb

## 2020-12-19 DIAGNOSIS — Z124 Encounter for screening for malignant neoplasm of cervix: Secondary | ICD-10-CM | POA: Insufficient documentation

## 2020-12-19 DIAGNOSIS — Z1231 Encounter for screening mammogram for malignant neoplasm of breast: Secondary | ICD-10-CM

## 2020-12-19 DIAGNOSIS — I10 Essential (primary) hypertension: Secondary | ICD-10-CM | POA: Diagnosis not present

## 2020-12-19 DIAGNOSIS — Z01419 Encounter for gynecological examination (general) (routine) without abnormal findings: Secondary | ICD-10-CM

## 2020-12-19 NOTE — Progress Notes (Signed)
64 y.o. G2P2001 Legally Separated White or Caucasian female here for annual exam.  Seeing new PCP in [facility].  On Tricor.  Had blood work done and triglycerides are now normal.  Denies vaginal bleeding.  Frustrated with weight.  Daughter is getting married in [place], May 1.  There are lots of shower and parties.  Back together with her husband.  Patient's last menstrual period was 09/22/2003.          Sexually active: No.  The current method of family planning is post menopausal status.    Exercising: Yes.    Smoker:  no  Health Maintenance: Pap:  Obtain today History of abnormal Pap:  no MMG:  01/2021 scheduled Colonoscopy:  2016, Dr. Watt Climes BMD:   10/03/2018, osteopenia TDaP:  07/29/2019 Pneumonia vaccine(s):  Not indicated Shingrix:   complete Hep C testing: 01/2016 Screening Labs: PCP   reports that she has never smoked. She has never used smokeless tobacco. She reports previous alcohol use. She reports that she does not use drugs.  Past Medical History:  Diagnosis Date  . Anxiety   . Depression 09/2001  . Elevated BP   . Elevated LDL cholesterol level 1/14  . Feeling lonely   . History of uterine fibroid 11/2002  . HTN (hypertension)   . Joint pain   . Leg edema   . Obesity   . Obstructive sleep apnea     Past Surgical History:  Procedure Laterality Date  . BELPHAROPTOSIS REPAIR      Current Outpatient Medications  Medication Sig Dispense Refill  . fenofibrate (TRICOR) 145 MG tablet Take 145 mg by mouth daily.    . phentermine 37.5 MG capsule Take 37.5 mg by mouth daily.    Marland Kitchen triamterene-hydrochlorothiazide (MAXZIDE-25) 37.5-25 MG tablet Take 1 tablet by mouth daily. 30 tablet 0   No current facility-administered medications for this visit.    Family History  Problem Relation Age of Onset  . Hypertension Mother   . Hyperlipidemia Mother   . Depression Mother   . Obesity Mother   . Hypertension Father   . Heart attack Father   . Obesity Father   .  Hyperlipidemia Father   . Heart disease Father   . Sudden death Daughter   . Colon cancer Maternal Grandmother     Review of Systems  All other systems reviewed and are negative.   Exam:   BP (!) 147/84 (BP Location: Right Arm, Patient Position: Sitting, Cuff Size: Large)   Pulse 70   Ht 5\' 3"  (1.6 m)   Wt 208 lb (94.3 kg)   LMP 09/22/2003   BMI 36.85 kg/m   Height: 5\' 3"  (160 cm)  General appearance: alert, cooperative and appears stated age Head: Normocephalic, without obvious abnormality, atraumatic Neck: no adenopathy, supple, symmetrical, trachea midline and thyroid normal to inspection and palpation Lungs: clear to auscultation bilaterally Breasts: normal appearance, no masses or tenderness Heart: regular rate and rhythm Abdomen: soft, non-tender; bowel sounds normal; no masses,  no organomegaly Extremities: extremities normal, atraumatic, no cyanosis or edema Skin: Skin color, texture, turgor normal. No rashes or lesions Lymph nodes: Cervical, supraclavicular, and axillary nodes normal. No abnormal inguinal nodes palpated Neurologic: Grossly normal   Pelvic: External genitalia:  no lesions              Urethra:  normal appearing urethra with no masses, tenderness or lesions              Bartholins and Skenes:  normal                 Vagina: normal appearing vagina with normal color and discharge, no lesions              Cervix: no lesions              Pap taken: Yes.   Bimanual Exam:  Uterus:  normal size, contour, position, consistency, mobility, non-tender              Adnexa: normal adnexa and no mass, fullness, tenderness               Rectovaginal: Confirms               Anus:  normal sphincter tone, no lesions  Chaperone, Cydney Ok, CMA, was present for exam.  Assessment/Plan: 1. Well woman exam with routine gynecological exam - pap and HR HPV obtained today - MMG ordered today here at [facility] - BMD 09/2018, osteopenia.  Repeat in another 1-2 years -  Colonoscopy 2016, Dr. Watt Climes - lab work done with PCP - vaccines reviewed  2.  PMP, no HRT  3.  Essential hypertension

## 2020-12-20 LAB — CYTOLOGY - PAP
Adequacy: ABSENT
Comment: NEGATIVE
Diagnosis: NEGATIVE
High risk HPV: NEGATIVE

## 2021-02-06 ENCOUNTER — Ambulatory Visit: Payer: BC Managed Care – PPO

## 2021-02-14 DIAGNOSIS — G4733 Obstructive sleep apnea (adult) (pediatric): Secondary | ICD-10-CM | POA: Diagnosis not present

## 2021-03-14 DIAGNOSIS — D539 Nutritional anemia, unspecified: Secondary | ICD-10-CM | POA: Diagnosis not present

## 2021-03-14 DIAGNOSIS — E559 Vitamin D deficiency, unspecified: Secondary | ICD-10-CM | POA: Diagnosis not present

## 2021-03-14 DIAGNOSIS — Z1159 Encounter for screening for other viral diseases: Secondary | ICD-10-CM | POA: Diagnosis not present

## 2021-03-14 DIAGNOSIS — R0602 Shortness of breath: Secondary | ICD-10-CM | POA: Diagnosis not present

## 2021-03-14 DIAGNOSIS — R5383 Other fatigue: Secondary | ICD-10-CM | POA: Diagnosis not present

## 2021-03-14 DIAGNOSIS — E7800 Pure hypercholesterolemia, unspecified: Secondary | ICD-10-CM | POA: Diagnosis not present

## 2021-04-21 ENCOUNTER — Ambulatory Visit (INDEPENDENT_AMBULATORY_CARE_PROVIDER_SITE_OTHER): Payer: Managed Care, Other (non HMO) | Admitting: Orthopedic Surgery

## 2021-04-21 ENCOUNTER — Encounter: Payer: Self-pay | Admitting: Orthopedic Surgery

## 2021-04-21 ENCOUNTER — Ambulatory Visit (INDEPENDENT_AMBULATORY_CARE_PROVIDER_SITE_OTHER): Payer: Managed Care, Other (non HMO)

## 2021-04-21 DIAGNOSIS — M25561 Pain in right knee: Secondary | ICD-10-CM | POA: Diagnosis not present

## 2021-04-21 DIAGNOSIS — S8261XA Displaced fracture of lateral malleolus of right fibula, initial encounter for closed fracture: Secondary | ICD-10-CM | POA: Diagnosis not present

## 2021-04-21 DIAGNOSIS — M25571 Pain in right ankle and joints of right foot: Secondary | ICD-10-CM | POA: Diagnosis not present

## 2021-04-21 DIAGNOSIS — G8929 Other chronic pain: Secondary | ICD-10-CM

## 2021-04-21 NOTE — Progress Notes (Signed)
Office Visit Note   Patient: Beth Rivers           Date of Birth: 05-13-1956           MRN: [id number] Visit Date: 04/21/2021              Requested by: Colonel Bald, MD [address] [place],  [place] [address] PCP: Colonel Bald, MD  Chief Complaint  Patient presents with   Right Ankle - Pain   Right Knee - Pain      HPI: Patient is a 65 year old woman who presents with acute right knee and right ankle pain.  She states that yesterday she was bringing her groceries had a supination external rotation injury to her right ankle fell landing on her knee and ankle.  Patient complains of an abrasion over the medial aspect of the patella and pain and swelling over the fibula.  Patient states she is active usually walks 4 miles at work a day she states right now she cannot get up from a seated to a standing position due to her ankle pain.  Assessment & Plan: Visit Diagnoses:  1. Chronic pain of right knee   2. Pain in right ankle and joints of right foot   3. Closed displaced fracture of lateral malleolus of right fibula, initial encounter     Plan: Discussed operative versus nonoperative treatment for the Weber B fibular fracture.  Patient should states she would like to proceed with open reduction internal fixation.  We will plan for outpatient surgery on Wednesday we will get her fracture boot recommended ice and elevation and follow-up in 2 weeks.  Follow-Up Instructions: No follow-ups on file.   Ortho Exam  Patient is alert, oriented, no adenopathy, well-dressed, normal affect, normal respiratory effort. All hand examination patient has swelling in the right lower extremity she has a palpable dorsalis pedis pulse she has tenderness to palpation over the distal fibula.  There is no blisters no skin breakdown.  Patient has a Weber B fibular fracture.  Examination the right knee there is abrasion to the right knee greater than the left but this is superficial.   Patient has active extension and varus and valgus stress is also stable.  No effusion of the right knee.  Imaging: XR Ankle Complete Right  Result Date: 04/21/2021 2 view radiographs of the right ankle shows a minimally displaced Weber B right fibular fracture no syndesmotic widening no posterior facet fracture.  XR Knee 1-2 Views Right  Result Date: 04/21/2021 2 view radiographs of the right knee shows mild arthritic changes no fractures.  No images are attached to the encounter.  Labs: Lab Results  Component Value Date   HGBA1C 5.2 05/01/2020   HGBA1C 5.1 01/09/2020   HGBA1C 5.1 08/01/2019   LABORGA NO GROWTH 01/25/2015     Lab Results  Component Value Date   ALBUMIN 4.4 05/01/2020   ALBUMIN 4.2 01/09/2020   ALBUMIN 4.3 08/01/2019    No results found for: MG Lab Results  Component Value Date   VD25OH 53.1 05/01/2020   VD25OH 64.5 01/09/2020   VD25OH 55.6 08/01/2019    No results found for: PREALBUMIN CBC EXTENDED Latest Ref Rng & Units 07/11/2018 09/27/2017 02/26/2016  WBC 3.4 - 10.8 x10E3/uL 6.2 9.9 6.2  RBC 3.77 - 5.28 x10E6/uL 4.56 4.90 4.88  HGB 11.1 - 15.9 g/dL 14.3 15.9(H) 15.9  HCT 34.0 - 46.6 % 42.8 46.3(H) 45.5  PLT 150.0 - 400.0 K/uL -  253.0 236  NEUTROABS 1.4 - 7.0 x10E3/uL 3.9 - 3.8  LYMPHSABS 0.7 - 3.1 x10E3/uL 1.7 - 1.7     There is no height or weight on file to calculate BMI.  Orders:  Orders Placed This Encounter  Procedures   XR Ankle Complete Right   XR Knee 1-2 Views Right   No orders of the defined types were placed in this encounter.    Procedures: No procedures performed  Clinical Data: No additional findings.  ROS:  All other systems negative, except as noted in the HPI. Review of Systems  Objective: Vital Signs: LMP 09/22/2003   Specialty Comments:  No specialty comments available.  PMFS History: Patient Active Problem List   Diagnosis Date Noted   Family history of early CAD 10/20/2019   Other hyperlipidemia  08/08/2019   Insulin resistance 02/16/2019   Vitamin D deficiency 01/09/2019   Numbness and tingling in left hand 01/09/2019   Routine general medical examination at a health care facility 09/28/2017   Obesity 11/22/2015   Essential hypertension 04/30/2015   Depression 11/27/2013   Fibroids 11/27/2013   Past Medical History:  Diagnosis Date   Anxiety    Depression 09/2001   Elevated BP    Elevated LDL cholesterol level 1/14   Feeling lonely    History of uterine fibroid 11/2002   HTN (hypertension)    Joint pain    Leg edema    Obesity    Obstructive sleep apnea     Family History  Problem Relation Age of Onset   Hypertension Mother    Hyperlipidemia Mother    Depression Mother    Obesity Mother    Hypertension Father    Heart attack Father    Obesity Father    Hyperlipidemia Father    Heart disease Father    Sudden death Daughter    Colon cancer Maternal Grandmother     Past Surgical History:  Procedure Laterality Date   BELPHAROPTOSIS REPAIR     Social History   Occupational History   Occupation: Naval architect  Tobacco Use   Smoking status: Never   Smokeless tobacco: Never  Vaping Use   Vaping Use: Never used  Substance and Sexual Activity   Alcohol use: Not Currently   Drug use: No   Sexual activity: Not Currently    Partners: Male    Birth control/protection: Abstinence

## 2021-04-22 ENCOUNTER — Other Ambulatory Visit: Payer: Self-pay | Admitting: Physician Assistant

## 2021-04-22 ENCOUNTER — Encounter (HOSPITAL_COMMUNITY): Payer: Self-pay | Admitting: Orthopedic Surgery

## 2021-04-22 ENCOUNTER — Other Ambulatory Visit: Payer: Self-pay

## 2021-04-22 NOTE — Progress Notes (Signed)
Beth Rivers denies chest pain or shortness of breath.  Patient denies having any s/s of Covid in her household.  Patient denies any known exposure to Covid.   I instructed patient to shower with antibiotic soap, if it is available.  Dry off with a clean towel. Do not put lotion, powder, cologne or deodorant or makeup.No jewelry or piercings. Woman should not shave. No nail polish, artificial or acrylic nails. Wear clean clothes, brush your teeth. Glasses, contact lens,dentures or partials may not be worn in the OR. If you need to wear them, please bring a case for glasses, do not wear contacts or bring a case, the hospital does not have contact cases, dentures or partials will have to be removed , make sure they are clean, we will provide a denture cup to put them in. You will need some one to drive you home and a responsible person over the age of 45 to stay with you for the first 24 hours after surgery.

## 2021-04-23 ENCOUNTER — Encounter (HOSPITAL_COMMUNITY): Payer: Self-pay | Admitting: Orthopedic Surgery

## 2021-04-23 ENCOUNTER — Encounter (HOSPITAL_COMMUNITY)
Admission: RE | Disposition: A | Discharge status: Home / Self Care | Payer: Self-pay | Source: Home / Self Care | Attending: Orthopedic Surgery

## 2021-04-23 ENCOUNTER — Ambulatory Visit (HOSPITAL_COMMUNITY): Payer: Managed Care, Other (non HMO) | Admitting: Certified Registered Nurse Anesthetist

## 2021-04-23 ENCOUNTER — Ambulatory Visit (HOSPITAL_COMMUNITY)
Admission: RE | Admit: 2021-04-23 | Discharge: 2021-04-23 | Disposition: A | Discharge status: Home / Self Care | Payer: Managed Care, Other (non HMO) | Attending: Orthopedic Surgery | Admitting: Orthopedic Surgery

## 2021-04-23 DIAGNOSIS — S82891A Other fracture of right lower leg, initial encounter for closed fracture: Secondary | ICD-10-CM | POA: Diagnosis not present

## 2021-04-23 DIAGNOSIS — S8261XA Displaced fracture of lateral malleolus of right fibula, initial encounter for closed fracture: Secondary | ICD-10-CM | POA: Insufficient documentation

## 2021-04-23 DIAGNOSIS — W19XXXA Unspecified fall, initial encounter: Secondary | ICD-10-CM | POA: Diagnosis not present

## 2021-04-23 DIAGNOSIS — M25561 Pain in right knee: Secondary | ICD-10-CM | POA: Diagnosis not present

## 2021-04-23 DIAGNOSIS — G8929 Other chronic pain: Secondary | ICD-10-CM | POA: Insufficient documentation

## 2021-04-23 HISTORY — DX: Unspecified osteoarthritis, unspecified site: M19.90

## 2021-04-23 HISTORY — PX: ORIF ANKLE FRACTURE: SHX5408

## 2021-04-23 LAB — SURGICAL PCR SCREEN
MRSA, PCR: NEGATIVE
Staphylococcus aureus: NEGATIVE

## 2021-04-23 LAB — CBC
HCT: 47.2 % — ABNORMAL HIGH (ref 36.0–46.0)
Hemoglobin: 15.4 g/dL — ABNORMAL HIGH (ref 12.0–15.0)
MCH: 31.4 pg (ref 26.0–34.0)
MCHC: 32.6 g/dL (ref 30.0–36.0)
MCV: 96.1 fL (ref 80.0–100.0)
Platelets: 247 10*3/uL (ref 150–400)
RBC: 4.91 MIL/uL (ref 3.87–5.11)
RDW: 12.8 % (ref 11.5–15.5)
WBC: 6.1 10*3/uL (ref 4.0–10.5)
nRBC: 0 % (ref 0.0–0.2)

## 2021-04-23 LAB — BASIC METABOLIC PANEL
Anion gap: 12 (ref 5–15)
BUN: 16 mg/dL (ref 8–23)
CO2: 25 mmol/L (ref 22–32)
Calcium: 9.3 mg/dL (ref 8.9–10.3)
Chloride: 102 mmol/L (ref 98–111)
Creatinine, Ser: 0.7 mg/dL (ref 0.44–1.00)
GFR, Estimated: >60 mL/min (ref >60)
Glucose, Bld: 86 mg/dL (ref 70–99)
Potassium: 3.5 mmol/L (ref 3.5–5.1)
Sodium: 139 mmol/L (ref 135–145)

## 2021-04-23 SURGERY — OPEN REDUCTION INTERNAL FIXATION (ORIF) ANKLE FRACTURE
Anesthesia: Regional | Site: Ankle | Laterality: Right

## 2021-04-23 MED ORDER — OXYCODONE HCL 5 MG PO TABS
5.0000 mg | ORAL_TABLET | Freq: Once | ORAL | Status: AC | PRN
Start: 2021-04-23 — End: 2021-04-23
  Administered 2021-04-23: 5 mg via ORAL

## 2021-04-23 MED ORDER — PROMETHAZINE HCL 25 MG/ML IJ SOLN: 6.2500 mg | INTRAMUSCULAR | Status: DC | PRN

## 2021-04-23 MED ORDER — MIDAZOLAM HCL 2 MG/2ML IJ SOLN
INTRAMUSCULAR | Status: AC
  Administered 2021-04-23: 2 mg via INTRAVENOUS
  Filled 2021-04-23: qty 2

## 2021-04-23 MED ORDER — FENTANYL CITRATE (PF) 250 MCG/5ML IJ SOLN
INTRAMUSCULAR | Status: DC | PRN
  Administered 2021-04-23 (×2): 25 ug via INTRAVENOUS

## 2021-04-23 MED ORDER — ORAL CARE MOUTH RINSE: 15.0000 mL | Freq: Once | OROMUCOSAL | Status: DC

## 2021-04-23 MED ORDER — LACTATED RINGERS IV SOLN: INTRAVENOUS | Status: DC

## 2021-04-23 MED ORDER — OXYCODONE HCL 5 MG/5ML PO SOLN
5.0000 mg | Freq: Once | ORAL | Status: AC | PRN
Start: 2021-04-23 — End: 2021-04-23

## 2021-04-23 MED ORDER — CHLORHEXIDINE GLUCONATE 0.12 % MT SOLN
OROMUCOSAL | Status: AC
  Filled 2021-04-23: qty 15

## 2021-04-23 MED ORDER — ONDANSETRON HCL 4 MG/2ML IJ SOLN
INTRAMUSCULAR | Status: DC | PRN
Start: 2021-04-23 — End: 2021-04-23
  Administered 2021-04-23: 4 mg via INTRAVENOUS

## 2021-04-23 MED ORDER — ROPIVACAINE HCL 5 MG/ML IJ SOLN
INTRAMUSCULAR | Status: DC | PRN
  Administered 2021-04-23: 30 mL via PERINEURAL
  Administered 2021-04-23: 15 mL via PERINEURAL

## 2021-04-23 MED ORDER — ACETAMINOPHEN 500 MG PO TABS
1000.0000 mg | ORAL_TABLET | Freq: Once | ORAL | Status: AC
  Administered 2021-04-23: 1000 mg via ORAL
  Filled 2021-04-23: qty 2

## 2021-04-23 MED ORDER — FENTANYL CITRATE (PF) 100 MCG/2ML IJ SOLN: 50.0000 ug | Freq: Once | INTRAMUSCULAR | Status: AC

## 2021-04-23 MED ORDER — LIDOCAINE 2% (20 MG/ML) 5 ML SYRINGE
INTRAMUSCULAR | Status: DC | PRN
  Administered 2021-04-23: 50 mg via INTRAVENOUS

## 2021-04-23 MED ORDER — PROPOFOL 10 MG/ML IV BOLUS
INTRAVENOUS | Status: DC | PRN
  Administered 2021-04-23: 130 mg via INTRAVENOUS

## 2021-04-23 MED ORDER — PROPOFOL 10 MG/ML IV BOLUS
INTRAVENOUS | Status: AC
  Filled 2021-04-23: qty 20

## 2021-04-23 MED ORDER — DEXAMETHASONE SODIUM PHOSPHATE 10 MG/ML IJ SOLN
INTRAMUSCULAR | Status: DC | PRN
  Administered 2021-04-23 (×2): 5 mg

## 2021-04-23 MED ORDER — HYDROCODONE-ACETAMINOPHEN 5-325 MG PO TABS: 1.0000 | ORAL_TABLET | ORAL | 0 refills | Status: DC | PRN

## 2021-04-23 MED ORDER — 0.9 % SODIUM CHLORIDE (POUR BTL) OPTIME
TOPICAL | Status: DC | PRN
  Administered 2021-04-23: 1000 mL

## 2021-04-23 MED ORDER — MIDAZOLAM HCL 2 MG/2ML IJ SOLN: 2.0000 mg | Freq: Once | INTRAMUSCULAR | Status: AC

## 2021-04-23 MED ORDER — FENTANYL CITRATE (PF) 100 MCG/2ML IJ SOLN
INTRAMUSCULAR | Status: AC
  Administered 2021-04-23: 50 ug via INTRAVENOUS
  Filled 2021-04-23: qty 2

## 2021-04-23 MED ORDER — DEXAMETHASONE SODIUM PHOSPHATE 10 MG/ML IJ SOLN
INTRAMUSCULAR | Status: DC | PRN
  Administered 2021-04-23: 5 mg via INTRAVENOUS

## 2021-04-23 MED ORDER — OXYCODONE HCL 5 MG PO TABS
ORAL_TABLET | ORAL | Status: AC
  Filled 2021-04-23: qty 1

## 2021-04-23 MED ORDER — FENTANYL CITRATE (PF) 250 MCG/5ML IJ SOLN
INTRAMUSCULAR | Status: AC
  Filled 2021-04-23: qty 5

## 2021-04-23 MED ORDER — FENTANYL CITRATE (PF) 100 MCG/2ML IJ SOLN: 25.0000 ug | INTRAMUSCULAR | Status: DC | PRN

## 2021-04-23 MED ORDER — CEFAZOLIN SODIUM-DEXTROSE 2-4 GM/100ML-% IV SOLN
2.0000 g | INTRAVENOUS | Status: AC
  Administered 2021-04-23: 2 g via INTRAVENOUS
  Filled 2021-04-23: qty 100

## 2021-04-23 MED ORDER — ACETAMINOPHEN 500 MG PO TABS: 1000.0000 mg | ORAL_TABLET | Freq: Once | ORAL | Status: DC

## 2021-04-23 MED ORDER — CHLORHEXIDINE GLUCONATE 0.12 % MT SOLN: 15.0000 mL | Freq: Once | OROMUCOSAL | Status: DC

## 2021-04-23 SURGICAL SUPPLY — 46 items
BAG COUNTER SPONGE SURGICOUNT (BAG) ×2 IMPLANT
BAG SPNG CNTER NS LX DISP (BAG) ×1
BANDAGE ESMARK 6X9 LF (GAUZE/BANDAGES/DRESSINGS) IMPLANT
BIT DRILL 110X2.5XQCK CNCT (BIT) IMPLANT
BIT DRILL 2.5 (BIT) ×2
BIT DRL 110X2.5XQCK CNCT (BIT) ×1
BNDG CMPR 9X6 STRL LF SNTH (GAUZE/BANDAGES/DRESSINGS)
BNDG COHESIVE 3X5 TAN STRL LF (GAUZE/BANDAGES/DRESSINGS) ×1 IMPLANT
BNDG COHESIVE 4X5 TAN STRL (GAUZE/BANDAGES/DRESSINGS) ×1 IMPLANT
BNDG ESMARK 6X9 LF (GAUZE/BANDAGES/DRESSINGS)
BNDG GAUZE ELAST 4 BULKY (GAUZE/BANDAGES/DRESSINGS) ×2 IMPLANT
COVER SURGICAL LIGHT HANDLE (MISCELLANEOUS) ×2 IMPLANT
DRAPE OEC MINIVIEW 54X84 (DRAPES) ×1 IMPLANT
DRAPE U-SHAPE 47X51 STRL (DRAPES) ×2 IMPLANT
DRSG ADAPTIC 3X8 NADH LF (GAUZE/BANDAGES/DRESSINGS) ×1 IMPLANT
DRSG CURAD 3X16 NADH (PACKING) ×1 IMPLANT
DRSG PAD ABDOMINAL 8X10 ST (GAUZE/BANDAGES/DRESSINGS) ×1 IMPLANT
DURAPREP 26ML APPLICATOR (WOUND CARE) ×2 IMPLANT
ELECT REM PT RETURN 9FT ADLT (ELECTROSURGICAL) ×2
ELECTRODE REM PT RTRN 9FT ADLT (ELECTROSURGICAL) ×1 IMPLANT
GAUZE SPONGE 4X4 12PLY STRL (GAUZE/BANDAGES/DRESSINGS) ×2 IMPLANT
GLOVE SURG ORTHO LTX SZ9 (GLOVE) ×2 IMPLANT
GLOVE SURG UNDER POLY LF SZ9 (GLOVE) ×2 IMPLANT
GOWN STRL REUS W/ TWL XL LVL3 (GOWN DISPOSABLE) ×3 IMPLANT
GOWN STRL REUS W/TWL XL LVL3 (GOWN DISPOSABLE) ×6
KIT BASIN OR (CUSTOM PROCEDURE TRAY) ×2 IMPLANT
KIT TURNOVER KIT B (KITS) ×2 IMPLANT
MANIFOLD NEPTUNE II (INSTRUMENTS) ×2 IMPLANT
NS IRRIG 1000ML POUR BTL (IV SOLUTION) ×2 IMPLANT
PACK ORTHO EXTREMITY (CUSTOM PROCEDURE TRAY) ×2 IMPLANT
PAD ARMBOARD 7.5X6 YLW CONV (MISCELLANEOUS) ×4 IMPLANT
PLATE 7HOLE 1/3 TUBULAR (Plate) ×1 IMPLANT
SCREW CORT 2.5X20X3.5XST SM (Screw) IMPLANT
SCREW CORTICAL 3.5 16MM (Screw) ×2 IMPLANT
SCREW CORTICAL 3.5X12 (Screw) ×2 IMPLANT
SCREW CORTICAL 3.5X14 (Screw) ×2 IMPLANT
SCREW CORTICAL 3.5X20 (Screw) ×2 IMPLANT
STAPLER VISISTAT 35W (STAPLE) IMPLANT
SUCTION FRAZIER HANDLE 10FR (MISCELLANEOUS) ×2
SUCTION TUBE FRAZIER 10FR DISP (MISCELLANEOUS) ×1 IMPLANT
SUT ETHILON 2 0 PSLX (SUTURE) IMPLANT
SUT VIC AB 2-0 CT1 27 (SUTURE) ×2
SUT VIC AB 2-0 CT1 TAPERPNT 27 (SUTURE) ×1 IMPLANT
TOWEL GREEN STERILE (TOWEL DISPOSABLE) ×2 IMPLANT
TOWEL GREEN STERILE FF (TOWEL DISPOSABLE) ×2 IMPLANT
TUBE CONNECTING 12X1/4 (SUCTIONS) ×2 IMPLANT

## 2021-04-23 NOTE — Op Note (Signed)
04/23/2021  6:46 PM  PATIENT:  Beth Rivers    PRE-OPERATIVE DIAGNOSIS:  Right Ankle Fibula Fracture  POST-OPERATIVE DIAGNOSIS:  Same  PROCEDURE:  OPEN REDUCTION INTERNAL FIXATION (ORIF) RIGHT ANKLE FRACTURE C-arm fluoroscopy to verify reduction.   SURGEON:  Newt Minion, MD  PHYSICIAN ASSISTANT:None ANESTHESIA:   General  PREOPERATIVE INDICATIONS:  [name] is a  65 y.o. female with a diagnosis of Right Ankle Fibula Fracture who failed conservative measures and elected for surgical management.    The risks benefits and alternatives were discussed with the patient preoperatively including but not limited to the risks of infection, bleeding, nerve injury, cardiopulmonary complications, the need for revision surgery, among others, and the patient was willing to proceed.  OPERATIVE IMPLANTS: 7 hole one third tubular plate  '@ENCIMAGES'$ @  OPERATIVE FINDINGS: C-arm fluoroscopy verified reduction.  Mortise congruent.  OPERATIVE PROCEDURE: Patient was brought the operating room and underwent a general anesthetic.  After adequate levels anesthesia were obtained patient's right lower extremity was prepped using DuraPrep draped into a sterile field a timeout was called.  A incision was made laterally over the fibula.  This was carried down to bone subperiosteal dissection was used to freshen the fracture site this was irrigated cleansed.  The fibula was pulled out to length and a interfrag screw was placed to stabilize the Weber B fibular fracture.  A antiglide plate was applied posterior laterally this was secured proximally with 2 compression screws distally with 1 compression screw.  C-arm fluoroscopy verified reduction.  The wound was irrigated with normal saline incision closed using 2-0 nylon sterile dressing was applied patient was extubated taken the PACU in stable condition   DISCHARGE PLANNING:  Antibiotic duration: Preoperative antibiotics  Weightbearing: Touchdown weightbearing  on the right  Pain medication: Prescription for Vicodin  Dressing care/ Wound VAC: Dry dressing  Ambulatory devices: Crutches and a kneeling scooter  Discharge to: Home.  Follow-up: In the office 1 week post operative.

## 2021-04-23 NOTE — Anesthesia Preprocedure Evaluation (Addendum)
Anesthesia Evaluation  Patient identified by MRN, date of birth, ID band Patient awake    Reviewed: Allergy & Precautions, Patient's Chart, lab work & pertinent test results  History of Anesthesia Complications Negative for: history of anesthetic complications  Airway Mallampati: III  TM Distance: >3 FB Neck ROM: Full    Dental no notable dental hx. (+) Teeth Intact, Dental Advisory Given   Pulmonary neg pulmonary ROS, sleep apnea and Continuous Positive Airway Pressure Ventilation ,    Pulmonary exam normal breath sounds clear to auscultation       Cardiovascular hypertension, Pt. on medications Normal cardiovascular exam Rhythm:Regular Rate:Normal  Stress Test 2021 normal   Neuro/Psych PSYCHIATRIC DISORDERS Anxiety Depression negative neurological ROS     GI/Hepatic negative GI ROS, Neg liver ROS,   Endo/Other  negative endocrine ROSBMI 38  Renal/GU negative Renal ROS  negative genitourinary   Musculoskeletal  (+) Arthritis ,   Abdominal   Peds  Hematology negative hematology ROS (+)   Anesthesia Other Findings Day of surgery medications reviewed with patient.  Reproductive/Obstetrics negative OB ROS                           Anesthesia Physical Anesthesia Plan  ASA: 2  Anesthesia Plan: General and Regional   Post-op Pain Management: GA combined w/ Regional for post-op pain   Induction:   PONV Risk Score and Plan: 3 and Treatment may vary due to age or medical condition, Ondansetron, Dexamethasone and Midazolam  Airway Management Planned: LMA  Additional Equipment: None  Intra-op Plan:   Post-operative Plan: Extubation in OR  Informed Consent:   Plan Discussed with:   Anesthesia Plan Comments:        Anesthesia Quick Evaluation

## 2021-04-23 NOTE — H&P (Signed)
Beth Rivers is an 65 y.o. female.   Chief Complaint: Right Ankle fracture HPI: HPI: Patient is a 65 year old woman who presents with acute right knee and right ankle pain.  She states that yesterday she was bringing her groceries had a supination external rotation injury to her right ankle fell landing on her knee and ankle.  Patient complains of an abrasion over the medial aspect of the patella and pain and swelling over the fibula.  Patient states she is active usually walks 4 miles at work a day she states right now she cannot get up from a seated to a standing position due to her ankle pain.  Past Medical History:  Diagnosis Date   Anxiety    Arthritis    Depression 09/2001   Elevated BP    Elevated LDL cholesterol level 09/2012   Feeling lonely    History of uterine fibroid 11/2002   HTN (hypertension)    Joint pain    Leg edema    Obesity    Obstructive sleep apnea     Past Surgical History:  Procedure Laterality Date   BELPHAROPTOSIS REPAIR     COLONOSCOPY      Family History  Problem Relation Age of Onset   Hypertension Mother    Hyperlipidemia Mother    Depression Mother    Obesity Mother    Hypertension Father    Heart attack Father    Obesity Father    Hyperlipidemia Father    Heart disease Father    Sudden death Daughter    Colon cancer Maternal Grandmother    Social History:  reports that she has never smoked. She has never used smokeless tobacco. She reports previous alcohol use. She reports that she does not use drugs.  Allergies: No Known Allergies  No medications prior to admission.    No results found for this or any previous visit (from the past 48 hour(s)). XR Ankle Complete Right  Result Date: 04/21/2021 2 view radiographs of the right ankle shows a minimally displaced Weber B right fibular fracture no syndesmotic widening no posterior facet fracture.  XR Knee 1-2 Views Right  Result Date: 04/21/2021 2 view radiographs of the right knee shows  mild arthritic changes no fractures.   Review of Systems  All other systems reviewed and are negative.  Height '5\' 3"'$  (1.6 m), weight 98 kg, last menstrual period 09/22/2003. Physical Exam  Patient is alert, oriented, no adenopathy, well-dressed, normal affect, normal respiratory effort. All hand examination patient has swelling in the right lower extremity she has a palpable dorsalis pedis pulse she has tenderness to palpation over the distal fibula.  There is no blisters no skin breakdown.  Patient has a Weber B fibular fracture.  Examination the right knee there is abrasion to the right knee greater than the left but this is superficial.  Patient has active extension and varus and valgus stress is also stable.  No effusion of the right knee.Heart RRR Lungs Clear Assessment/Plan 1. Chronic pain of right knee  2. Pain in right ankle and joints of right foot  3. Closed displaced fracture of lateral malleolus of right fibula, initial encounter       Plan: Discussed operative versus nonoperative treatment for the Weber B fibular fracture.  Patient should states she would like to proceed with open reduction internal fixation.  We will plan for outpatient surgery on Wednesday we will get her fracture boot recommended ice and elevation and follow-up in 2 weeks.  Bevely Palmer Tessia Kassin, PA 04/23/2021, 6:41 AM

## 2021-04-23 NOTE — Interval H&P Note (Signed)
History and Physical Interval Note:  04/23/2021 2:56 PM  Beth Rivers  has presented today for surgery, with the diagnosis of Right Ankle Fibula Fracture.  The various methods of treatment have been discussed with the patient and family. After consideration of risks, benefits and other options for treatment, the patient has consented to  Procedure(s): OPEN REDUCTION INTERNAL FIXATION (ORIF) RIGHT ANKLE FRACTURE (Right) as a surgical intervention.  [name] history has been reviewed, patient examined, no change in status, stable for surgery.  I have reviewed the patient's chart and labs.  Questions were answered to the patient's satisfaction.     Newt Minion

## 2021-04-23 NOTE — Transfer of Care (Signed)
Immediate Anesthesia Transfer of Care Note  Patient: Beth Rivers  Procedure(s) Performed: OPEN REDUCTION INTERNAL FIXATION (ORIF) RIGHT ANKLE FRACTURE (Right: Ankle)  Patient Location: PACU  Anesthesia Type:General  Level of Consciousness: awake, alert  and oriented  Airway & Oxygen Therapy: Patient Spontanous Breathing  Post-op Assessment: Report given to RN and Post -op Vital signs reviewed and stable  Post vital signs: Reviewed and stable  Last Vitals:  Vitals Value Taken Time  BP 126/71 04/23/21 1850  Temp 36.4 C 04/23/21 1850  Pulse 73 04/23/21 1856  Resp 16 04/23/21 1856  SpO2 94 % 04/23/21 1856  Vitals shown include unvalidated device data.  Last Pain:  Vitals:   04/23/21 1850  TempSrc:   PainSc: 0-No pain         Complications: No notable events documented.

## 2021-04-23 NOTE — Progress Notes (Signed)
Orthopedic Tech Progress Note Patient Details:  Beth Rivers December 10, 1955 [id number]  Ortho Devices Type of Ortho Device: Crutches Ortho Device/Splint Interventions: Adjustment      Linus Salmons Carleena Mires 04/23/2021, 7:09 PM

## 2021-04-23 NOTE — Anesthesia Procedure Notes (Signed)
Anesthesia Regional Block: Popliteal block   Pre-Anesthetic Checklist: , timeout performed,  Correct Patient, Correct Site, Correct Laterality,  Correct Procedure, Correct Position, site marked,  Risks and benefits discussed,  Surgical consent,  Pre-op evaluation,  At surgeon's request and post-op pain management  Laterality: Right  Prep: Maximum Sterile Barrier Precautions used, chloraprep       Needles:  Injection technique: Single-shot  Needle Type: Echogenic Stimulator Needle     Needle Length: 9cm  Needle Gauge: 22     Additional Needles:   Procedures:,,,, ultrasound used (permanent image in chart),,    Narrative:  Start time: 04/23/2021 5:35 PM End time: 04/23/2021 5:41 PM Injection made incrementally with aspirations every 5 mL.  Performed by: Personally  Anesthesiologist: Freddrick March, MD  Additional Notes: Monitors applied. No increased pain on injection. No increased resistance to injection. Injection made in 5cc increments. Good needle visualization. Patient tolerated procedure well.

## 2021-04-23 NOTE — Anesthesia Procedure Notes (Addendum)
Procedure Name: LMA Insertion Date/Time: 04/23/2021 6:15 PM Performed by: Dorthea Cove, CRNA Pre-anesthesia Checklist: Patient identified, Emergency Drugs available, Suction available and Patient being monitored Patient Re-evaluated:Patient Re-evaluated prior to induction Oxygen Delivery Method: Circle System Utilized Preoxygenation: Pre-oxygenation with 100% oxygen Induction Type: IV induction Ventilation: Mask ventilation without difficulty LMA: LMA inserted LMA Size: 4.0 Number of attempts: 1 Airway Equipment and Method: Bite block Placement Confirmation: positive ETCO2 Tube secured with: Tape Dental Injury: Teeth and Oropharynx as per pre-operative assessment

## 2021-04-23 NOTE — Anesthesia Procedure Notes (Signed)
Anesthesia Regional Block: Adductor canal block   Pre-Anesthetic Checklist: , timeout performed,  Correct Patient, Correct Site, Correct Laterality,  Correct Procedure, Correct Position, site marked,  Risks and benefits discussed,  Surgical consent,  Pre-op evaluation,  At surgeon's request and post-op pain management  Laterality: Right  Prep: Maximum Sterile Barrier Precautions used, chloraprep       Needles:  Injection technique: Single-shot  Needle Type: Echogenic Stimulator Needle     Needle Length: 9cm  Needle Gauge: 22     Additional Needles:   Procedures:,,,, ultrasound used (permanent image in chart),,    Narrative:  Start time: 04/23/2021 5:41 PM End time: 04/23/2021 5:43 PM Injection made incrementally with aspirations every 5 mL.  Performed by: Personally  Anesthesiologist: Freddrick March, MD  Additional Notes: Monitors applied. No increased pain on injection. No increased resistance to injection. Injection made in 5cc increments. Good needle visualization. Patient tolerated procedure well.

## 2021-04-24 ENCOUNTER — Encounter (HOSPITAL_COMMUNITY): Payer: Self-pay | Admitting: Orthopedic Surgery

## 2021-04-25 ENCOUNTER — Telehealth: Payer: Self-pay | Admitting: Orthopedic Surgery

## 2021-04-25 NOTE — Anesthesia Postprocedure Evaluation (Signed)
Anesthesia Post Note  Patient: Beth Rivers  Procedure(s) Performed: OPEN REDUCTION INTERNAL FIXATION (ORIF) RIGHT ANKLE FRACTURE (Right: Ankle)     Patient location during evaluation: PACU Anesthesia Type: Regional and General Level of consciousness: awake and alert Pain management: pain level controlled Vital Signs Assessment: post-procedure vital signs reviewed and stable Respiratory status: spontaneous breathing, nonlabored ventilation, respiratory function stable and patient connected to nasal cannula oxygen Cardiovascular status: blood pressure returned to baseline and stable Postop Assessment: no apparent nausea or vomiting Anesthetic complications: no   No notable events documented.  Last Vitals:  Vitals:   04/23/21 1905 04/23/21 1920  BP: 118/69 121/72  Pulse: 73 72  Resp: 16 20  Temp:  36.4 C  SpO2: 96% 96%    Last Pain:  Vitals:   04/23/21 1920  TempSrc:   PainSc: 2                  Kristoff Coonradt S

## 2021-04-25 NOTE — Telephone Encounter (Signed)
Principal forms received. To [employer].

## 2021-04-30 ENCOUNTER — Ambulatory Visit (INDEPENDENT_AMBULATORY_CARE_PROVIDER_SITE_OTHER): Payer: Managed Care, Other (non HMO) | Admitting: Physician Assistant

## 2021-04-30 ENCOUNTER — Encounter: Payer: Managed Care, Other (non HMO) | Admitting: Physician Assistant

## 2021-04-30 ENCOUNTER — Encounter: Payer: Self-pay | Admitting: Physician Assistant

## 2021-04-30 DIAGNOSIS — M25571 Pain in right ankle and joints of right foot: Secondary | ICD-10-CM

## 2021-04-30 NOTE — Progress Notes (Signed)
Office Visit Note   Patient: Beth Rivers           Date of Birth: 1956/02/28           MRN: [id number] Visit Date: 04/30/2021              Requested by: Colonel Bald, MD [address] [place],  [place] [address] PCP: Colonel Bald, MD  Chief Complaint  Patient presents with   Right Ankle - Routine Post Op      HPI: Patient presents today 1 week status post open reduction internal fixation of distal fibula fracture.  She is in a cam walker boot has no complaints is not really taking any pain medicine denies fever, chills, or calf pain  Assessment & Plan: Visit Diagnoses: No diagnosis found.  Plan: May begin cleansing with antibacterial soap and water.  May work on ankle range of motion demonstrated some exercises to her today.  We will follow-up in 1 week at which time x-rays of her ankle should be obtained hopefully we can remove her stitches  Follow-Up Instructions: No follow-ups on file.   Ortho Exam  Patient is alert, oriented, no adenopathy, well-dressed, normal affect, normal respiratory effort. Examination well apposed wound edges with some slight bleeding.  Swelling is overall well controlled no erythema no cellulitis no signs of infection compartments are soft and nontender negative Homans' sign  Imaging: No results found. No images are attached to the encounter.  Labs: Lab Results  Component Value Date   HGBA1C 5.2 05/01/2020   HGBA1C 5.1 01/09/2020   HGBA1C 5.1 08/01/2019   LABORGA NO GROWTH 01/25/2015     Lab Results  Component Value Date   ALBUMIN 4.4 05/01/2020   ALBUMIN 4.2 01/09/2020   ALBUMIN 4.3 08/01/2019    No results found for: MG Lab Results  Component Value Date   VD25OH 53.1 05/01/2020   VD25OH 64.5 01/09/2020   VD25OH 55.6 08/01/2019    No results found for: PREALBUMIN CBC EXTENDED Latest Ref Rng & Units 04/23/2021 07/11/2018 09/27/2017  WBC 4.0 - 10.5 K/uL 6.1 6.2 9.9  RBC 3.87 - 5.11 MIL/uL 4.91 4.56 4.90  HGB  12.0 - 15.0 g/dL 15.4(H) 14.3 15.9(H)  HCT 36.0 - 46.0 % 47.2(H) 42.8 46.3(H)  PLT 150 - 400 K/uL 247 - 253.0  NEUTROABS 1.4 - 7.0 x10E3/uL - 3.9 -  LYMPHSABS 0.7 - 3.1 x10E3/uL - 1.7 -     There is no height or weight on file to calculate BMI.  Orders:  No orders of the defined types were placed in this encounter.  No orders of the defined types were placed in this encounter.    Procedures: No procedures performed  Clinical Data: No additional findings.  ROS:  All other systems negative, except as noted in the HPI. Review of Systems  Objective: Vital Signs: LMP 09/22/2003   Specialty Comments:  No specialty comments available.  PMFS History: Patient Active Problem List   Diagnosis Date Noted   Ankle fracture, right, closed, initial encounter    Family history of early CAD 10/20/2019   Other hyperlipidemia 08/08/2019   Insulin resistance 02/16/2019   Vitamin D deficiency 01/09/2019   Numbness and tingling in left hand 01/09/2019   Routine general medical examination at a health care facility 09/28/2017   Obesity 11/22/2015   Essential hypertension 04/30/2015   Depression 11/27/2013   Fibroids 11/27/2013   Past Medical History:  Diagnosis Date   Anxiety  Arthritis    Depression 09/2001   Elevated BP    Elevated LDL cholesterol level 09/2012   Feeling lonely    History of uterine fibroid 11/2002   HTN (hypertension)    Joint pain    Leg edema    Obesity    Obstructive sleep apnea     Family History  Problem Relation Age of Onset   Hypertension Mother    Hyperlipidemia Mother    Depression Mother    Obesity Mother    Hypertension Father    Heart attack Father    Obesity Father    Hyperlipidemia Father    Heart disease Father    Sudden death Daughter    Colon cancer Maternal Grandmother     Past Surgical History:  Procedure Laterality Date   BELPHAROPTOSIS REPAIR     COLONOSCOPY     ORIF ANKLE FRACTURE Right 04/23/2021   Procedure: OPEN  REDUCTION INTERNAL FIXATION (ORIF) RIGHT ANKLE FRACTURE;  Surgeon: Newt Minion, MD;  Location: [facility];  Service: Orthopedics;  Laterality: Right;   Social History   Occupational History   Occupation: Naval architect  Tobacco Use   Smoking status: Never   Smokeless tobacco: Never  Vaping Use   Vaping Use: Never used  Substance and Sexual Activity   Alcohol use: Not Currently   Drug use: No   Sexual activity: Not Currently    Partners: Male    Birth control/protection: Abstinence

## 2021-05-07 ENCOUNTER — Ambulatory Visit: Payer: Self-pay

## 2021-05-07 ENCOUNTER — Encounter: Payer: Self-pay | Admitting: Physician Assistant

## 2021-05-07 ENCOUNTER — Other Ambulatory Visit: Payer: Self-pay

## 2021-05-07 ENCOUNTER — Ambulatory Visit (INDEPENDENT_AMBULATORY_CARE_PROVIDER_SITE_OTHER): Payer: Managed Care, Other (non HMO) | Admitting: Physician Assistant

## 2021-05-07 DIAGNOSIS — M25571 Pain in right ankle and joints of right foot: Secondary | ICD-10-CM

## 2021-05-07 MED ORDER — DOXYCYCLINE HYCLATE 100 MG PO TABS: 100.0000 mg | ORAL_TABLET | Freq: Two times a day (BID) | ORAL | 0 refills | Status: DC

## 2021-05-07 NOTE — Progress Notes (Signed)
Office Visit Note   Patient: Beth Rivers           Date of Birth: 26-Aug-1956           MRN: [id number] Visit Date: 05/07/2021              Requested by: Colonel Bald, MD [address] [place],  [place] [address] PCP: Colonel Bald, MD  Chief Complaint  Patient presents with   Right Ankle - Follow-up      HPI: Patient is 2 weeks status post open reduction internal fixation of distal fibula fracture.  She is ambulating in a cam walker boot.  She admits she has been on her foot a little bit more.  She is also having shooting pains.  She denies any calf pain.  Assessment & Plan: Visit Diagnoses:  1. Pain in right ankle and joints of right foot     Plan: We will place her on a week of doxycycline given the redness and slight wound dehiscence.  She will try to modify her activities a little bit follow-up in 1 week I am hopeful stitches can be removed at that time  Follow-Up Instructions: No follow-ups on file.   Ortho Exam  Patient is alert, oriented, no adenopathy, well-dressed, normal affect, normal respiratory effort. Examination she has minimal soft tissue swelling pulses are palpable.  She does have ever so slight wound dehiscence at the end of the wound with some serous drainage.  She has some surrounding erythema but no ascending cellulitis no foul odor  Imaging: No results found. No images are attached to the encounter.  Labs: Lab Results  Component Value Date   HGBA1C 5.2 05/01/2020   HGBA1C 5.1 01/09/2020   HGBA1C 5.1 08/01/2019   LABORGA NO GROWTH 01/25/2015     Lab Results  Component Value Date   ALBUMIN 4.4 05/01/2020   ALBUMIN 4.2 01/09/2020   ALBUMIN 4.3 08/01/2019    No results found for: MG Lab Results  Component Value Date   VD25OH 53.1 05/01/2020   VD25OH 64.5 01/09/2020   VD25OH 55.6 08/01/2019    No results found for: PREALBUMIN CBC EXTENDED Latest Ref Rng & Units 04/23/2021 07/11/2018 09/27/2017  WBC 4.0 - 10.5 K/uL 6.1  6.2 9.9  RBC 3.87 - 5.11 MIL/uL 4.91 4.56 4.90  HGB 12.0 - 15.0 g/dL 15.4(H) 14.3 15.9(H)  HCT 36.0 - 46.0 % 47.2(H) 42.8 46.3(H)  PLT 150 - 400 K/uL 247 - 253.0  NEUTROABS 1.4 - 7.0 x10E3/uL - 3.9 -  LYMPHSABS 0.7 - 3.1 x10E3/uL - 1.7 -     There is no height or weight on file to calculate BMI.  Orders:  Orders Placed This Encounter  Procedures   XR Ankle Complete Right   Meds ordered this encounter  Medications   doxycycline (VIBRA-TABS) 100 MG tablet    Sig: Take 1 tablet (100 mg total) by mouth 2 (two) times daily.    Dispense:  60 tablet    Refill:  0     Procedures: No procedures performed  Clinical Data: No additional findings.  ROS:  All other systems negative, except as noted in the HPI. Review of Systems  Objective: Vital Signs: LMP 09/22/2003   Specialty Comments:  No specialty comments available.  PMFS History: Patient Active Problem List   Diagnosis Date Noted   Ankle fracture, right, closed, initial encounter    Family history of early CAD 10/20/2019   Other hyperlipidemia 08/08/2019   Insulin  resistance 02/16/2019   Vitamin D deficiency 01/09/2019   Numbness and tingling in left hand 01/09/2019   Routine general medical examination at a health care facility 09/28/2017   Obesity 11/22/2015   Essential hypertension 04/30/2015   Depression 11/27/2013   Fibroids 11/27/2013   Past Medical History:  Diagnosis Date   Anxiety    Arthritis    Depression 09/2001   Elevated BP    Elevated LDL cholesterol level 09/2012   Feeling lonely    History of uterine fibroid 11/2002   HTN (hypertension)    Joint pain    Leg edema    Obesity    Obstructive sleep apnea     Family History  Problem Relation Age of Onset   Hypertension Mother    Hyperlipidemia Mother    Depression Mother    Obesity Mother    Hypertension Father    Heart attack Father    Obesity Father    Hyperlipidemia Father    Heart disease Father    Sudden death Daughter     Colon cancer Maternal Grandmother     Past Surgical History:  Procedure Laterality Date   BELPHAROPTOSIS REPAIR     COLONOSCOPY     ORIF ANKLE FRACTURE Right 04/23/2021   Procedure: OPEN REDUCTION INTERNAL FIXATION (ORIF) RIGHT ANKLE FRACTURE;  Surgeon: Newt Minion, MD;  Location: [facility];  Service: Orthopedics;  Laterality: Right;   Social History   Occupational History   Occupation: Naval architect  Tobacco Use   Smoking status: Never   Smokeless tobacco: Never  Vaping Use   Vaping Use: Never used  Substance and Sexual Activity   Alcohol use: Not Currently   Drug use: No   Sexual activity: Not Currently    Partners: Male    Birth control/protection: Abstinence

## 2021-05-12 ENCOUNTER — Ambulatory Visit (INDEPENDENT_AMBULATORY_CARE_PROVIDER_SITE_OTHER): Payer: Managed Care, Other (non HMO) | Admitting: Orthopedic Surgery

## 2021-05-12 ENCOUNTER — Other Ambulatory Visit: Payer: Self-pay

## 2021-05-12 ENCOUNTER — Encounter: Payer: Self-pay | Admitting: Orthopedic Surgery

## 2021-05-12 VITALS — Ht 63.0 in | Wt 215.0 lb

## 2021-05-12 DIAGNOSIS — S8261XA Displaced fracture of lateral malleolus of right fibula, initial encounter for closed fracture: Secondary | ICD-10-CM

## 2021-05-12 NOTE — Progress Notes (Signed)
Office Visit Note   Patient: Beth Rivers           Date of Birth: 03/06/1956           MRN: [id number] Visit Date: 05/12/2021              Requested by: Colonel Bald, MD [address] [place],  [place] [address] PCP: Colonel Bald, MD  Chief Complaint  Patient presents with   Right Ankle - Follow-up    Right ankle ORIF 04/23/2021      HPI: Patient is a 65 year old woman who presents 2-week status post open reduction internal fixation of the right ankle fibular fracture Weber B.  Patient states she went back to work soon after surgery she states she walked 6 miles according to her Fitbit and has been having increasing pain and swelling with wound dehiscence.  Assessment & Plan: Visit Diagnoses:  1. Closed displaced fracture of lateral malleolus of right fibula, initial encounter     Plan: Patient was placed in a extra-large 20-30 compression stocking.  She is given a prescription to get a pair of extra-large compression stockings to be worn around-the-clock wash her leg with soap and water minimize weightbearing use her kneeling scooter and follow-up in 1 week.  Follow-Up Instructions: Return in about 1 week (around 05/19/2021).   Ortho Exam  Patient is alert, oriented, no adenopathy, well-dressed, normal affect, normal respiratory effort. Examination there is dermatitis swelling and pitting edema of the right leg.  The distal aspect of the incision has gaped open with fibrinous tissue at the base of the wound.  She has a good dorsalis pedis pulse her foot is cool to the touch.  There is dermatitis around the incision but no cellulitis she is currently on doxycycline.  Patient was placed in a 20-30 extra-large compression stocking.  Imaging: No results found. No images are attached to the encounter.  Labs: Lab Results  Component Value Date   HGBA1C 5.2 05/01/2020   HGBA1C 5.1 01/09/2020   HGBA1C 5.1 08/01/2019   LABORGA NO GROWTH 01/25/2015     Lab  Results  Component Value Date   ALBUMIN 4.4 05/01/2020   ALBUMIN 4.2 01/09/2020   ALBUMIN 4.3 08/01/2019    No results found for: MG Lab Results  Component Value Date   VD25OH 53.1 05/01/2020   VD25OH 64.5 01/09/2020   VD25OH 55.6 08/01/2019    No results found for: PREALBUMIN CBC EXTENDED Latest Ref Rng & Units 04/23/2021 07/11/2018 09/27/2017  WBC 4.0 - 10.5 K/uL 6.1 6.2 9.9  RBC 3.87 - 5.11 MIL/uL 4.91 4.56 4.90  HGB 12.0 - 15.0 g/dL 15.4(H) 14.3 15.9(H)  HCT 36.0 - 46.0 % 47.2(H) 42.8 46.3(H)  PLT 150 - 400 K/uL 247 - 253.0  NEUTROABS 1.4 - 7.0 x10E3/uL - 3.9 -  LYMPHSABS 0.7 - 3.1 x10E3/uL - 1.7 -     Body mass index is 38.09 kg/m.  Orders:  No orders of the defined types were placed in this encounter.  No orders of the defined types were placed in this encounter.    Procedures: No procedures performed  Clinical Data: No additional findings.  ROS:  All other systems negative, except as noted in the HPI. Review of Systems  Objective: Vital Signs: Ht '5\' 3"'$  (1.6 m)   Wt 215 lb (97.5 kg)   LMP 09/22/2003   BMI 38.09 kg/m   Specialty Comments:  No specialty comments available.  PMFS History: Patient Active Problem List  Diagnosis Date Noted   Ankle fracture, right, closed, initial encounter    Family history of early CAD 10/20/2019   Other hyperlipidemia 08/08/2019   Insulin resistance 02/16/2019   Vitamin D deficiency 01/09/2019   Numbness and tingling in left hand 01/09/2019   Routine general medical examination at a health care facility 09/28/2017   Obesity 11/22/2015   Essential hypertension 04/30/2015   Depression 11/27/2013   Fibroids 11/27/2013   Past Medical History:  Diagnosis Date   Anxiety    Arthritis    Depression 09/2001   Elevated BP    Elevated LDL cholesterol level 09/2012   Feeling lonely    History of uterine fibroid 11/2002   HTN (hypertension)    Joint pain    Leg edema    Obesity    Obstructive sleep apnea      Family History  Problem Relation Age of Onset   Hypertension Mother    Hyperlipidemia Mother    Depression Mother    Obesity Mother    Hypertension Father    Heart attack Father    Obesity Father    Hyperlipidemia Father    Heart disease Father    Sudden death Daughter    Colon cancer Maternal Grandmother     Past Surgical History:  Procedure Laterality Date   BELPHAROPTOSIS REPAIR     COLONOSCOPY     ORIF ANKLE FRACTURE Right 04/23/2021   Procedure: OPEN REDUCTION INTERNAL FIXATION (ORIF) RIGHT ANKLE FRACTURE;  Surgeon: Newt Minion, MD;  Location: [facility];  Service: Orthopedics;  Laterality: Right;   Social History   Occupational History   Occupation: Naval architect  Tobacco Use   Smoking status: Never   Smokeless tobacco: Never  Vaping Use   Vaping Use: Never used  Substance and Sexual Activity   Alcohol use: Not Currently   Drug use: No   Sexual activity: Not Currently    Partners: Male    Birth control/protection: Abstinence

## 2021-05-13 ENCOUNTER — Encounter: Payer: Self-pay | Admitting: Orthopedic Surgery

## 2021-05-14 ENCOUNTER — Ambulatory Visit: Payer: Managed Care, Other (non HMO) | Admitting: Physician Assistant

## 2021-05-19 ENCOUNTER — Encounter: Payer: Self-pay | Admitting: Orthopedic Surgery

## 2021-05-19 ENCOUNTER — Ambulatory Visit (INDEPENDENT_AMBULATORY_CARE_PROVIDER_SITE_OTHER): Payer: Managed Care, Other (non HMO) | Admitting: Physician Assistant

## 2021-05-19 DIAGNOSIS — S8261XA Displaced fracture of lateral malleolus of right fibula, initial encounter for closed fracture: Secondary | ICD-10-CM

## 2021-05-19 NOTE — Progress Notes (Signed)
Office Visit Note   Patient: Beth Rivers           Date of Birth: 1956/01/05           MRN: [id number] Visit Date: 05/19/2021              Requested by: Colonel Bald, MD [address] [place],  [place] [address] PCP: Colonel Bald, MD  Chief Complaint  Patient presents with   Right Ankle - Follow-up      HPI: Patient presents today she is 4 weeks status post open reduction internal fixation of her right ankle.  Her wound has been slow to heal.  At her last visit with Dr. Sharol Given she was placed in a vive compression sock and is on doxycycline.  She reports she feels its gotten much better  Assessment & Plan: Visit Diagnoses: No diagnosis found.  Plan: Patient will continue with socks she may wean to a regular sneaker if she has months as supportive as she said the boot is rubbing over the incision and making it more painful.  Continue with doxycycline and a probiotic.  Follow-up in 1 week.  Radiographs of her ankle should be obtained at that time  Follow-Up Instructions: No follow-ups on file.   Ortho Exam  Patient is alert, oriented, no adenopathy, well-dressed, normal affect, normal respiratory effort. Examination wound is contracting better.  She has a slight dehiscence of about a millimeter.  Improved from last visit proximal sutures were removed today.  She has no ascending cellulitis minimal drainage some eschar formation swelling is much better controlled  Imaging: No results found. No images are attached to the encounter.  Labs: Lab Results  Component Value Date   HGBA1C 5.2 05/01/2020   HGBA1C 5.1 01/09/2020   HGBA1C 5.1 08/01/2019   LABORGA NO GROWTH 01/25/2015     Lab Results  Component Value Date   ALBUMIN 4.4 05/01/2020   ALBUMIN 4.2 01/09/2020   ALBUMIN 4.3 08/01/2019    No results found for: MG Lab Results  Component Value Date   VD25OH 53.1 05/01/2020   VD25OH 64.5 01/09/2020   VD25OH 55.6 08/01/2019    No results found for:  PREALBUMIN CBC EXTENDED Latest Ref Rng & Units 04/23/2021 07/11/2018 09/27/2017  WBC 4.0 - 10.5 K/uL 6.1 6.2 9.9  RBC 3.87 - 5.11 MIL/uL 4.91 4.56 4.90  HGB 12.0 - 15.0 g/dL 15.4(H) 14.3 15.9(H)  HCT 36.0 - 46.0 % 47.2(H) 42.8 46.3(H)  PLT 150 - 400 K/uL 247 - 253.0  NEUTROABS 1.4 - 7.0 x10E3/uL - 3.9 -  LYMPHSABS 0.7 - 3.1 x10E3/uL - 1.7 -     There is no height or weight on file to calculate BMI.  Orders:  No orders of the defined types were placed in this encounter.  No orders of the defined types were placed in this encounter.    Procedures: No procedures performed  Clinical Data: No additional findings.  ROS:  All other systems negative, except as noted in the HPI. Review of Systems  Objective: Vital Signs: LMP 09/22/2003   Specialty Comments:  No specialty comments available.  PMFS History: Patient Active Problem List   Diagnosis Date Noted   Ankle fracture, right, closed, initial encounter    Family history of early CAD 10/20/2019   Other hyperlipidemia 08/08/2019   Insulin resistance 02/16/2019   Vitamin D deficiency 01/09/2019   Numbness and tingling in left hand 01/09/2019   Routine general medical examination at a health  care facility 09/28/2017   Obesity 11/22/2015   Essential hypertension 04/30/2015   Depression 11/27/2013   Fibroids 11/27/2013   Past Medical History:  Diagnosis Date   Anxiety    Arthritis    Depression 09/2001   Elevated BP    Elevated LDL cholesterol level 09/2012   Feeling lonely    History of uterine fibroid 11/2002   HTN (hypertension)    Joint pain    Leg edema    Obesity    Obstructive sleep apnea     Family History  Problem Relation Age of Onset   Hypertension Mother    Hyperlipidemia Mother    Depression Mother    Obesity Mother    Hypertension Father    Heart attack Father    Obesity Father    Hyperlipidemia Father    Heart disease Father    Sudden death Daughter    Colon cancer Maternal Grandmother      Past Surgical History:  Procedure Laterality Date   BELPHAROPTOSIS REPAIR     COLONOSCOPY     ORIF ANKLE FRACTURE Right 04/23/2021   Procedure: OPEN REDUCTION INTERNAL FIXATION (ORIF) RIGHT ANKLE FRACTURE;  Surgeon: Newt Minion, MD;  Location: [facility];  Service: Orthopedics;  Laterality: Right;   Social History   Occupational History   Occupation: Naval architect  Tobacco Use   Smoking status: Never   Smokeless tobacco: Never  Vaping Use   Vaping Use: Never used  Substance and Sexual Activity   Alcohol use: Not Currently   Drug use: No   Sexual activity: Not Currently    Partners: Male    Birth control/protection: Abstinence

## 2021-05-27 ENCOUNTER — Other Ambulatory Visit: Payer: Self-pay

## 2021-05-27 ENCOUNTER — Ambulatory Visit (INDEPENDENT_AMBULATORY_CARE_PROVIDER_SITE_OTHER): Payer: Managed Care, Other (non HMO) | Admitting: Family

## 2021-05-27 DIAGNOSIS — S82891A Other fracture of right lower leg, initial encounter for closed fracture: Secondary | ICD-10-CM

## 2021-05-27 NOTE — Progress Notes (Signed)
Post-Op Visit Note   Patient: Beth Rivers           Date of Birth: Feb 07, 1956           MRN: [id number] Visit Date: 05/27/2021 PCP: Colonel Bald, MD  Chief Complaint:  Chief Complaint  Patient presents with   Right Ankle - Follow-up    HPI:  HPI The patient is a 65 year old woman who is seen status post right ankle fracture with ORIF.  Today she is full weightbearing in regular shoewear she is eager to have her sutures harvested.  She states she continues on her doxycycline she has been wearing her medical compression stocking with direct skin contact. Ortho Exam On examination of the right ankle her incision did dehisce a little bit there is fibrinous tissue in the wound bed.  Wound bed about 2 mm deep sutures do remain in place distally.  There is very mild surrounding erythema there is no significant edema no drainage no odor  Visit Diagnoses:  1. Ankle fracture, right, closed, initial encounter     Plan: Remaining sutures harvested today.  Radiographs of the right ankle at follow-up discussed she will continue with incisional cleansing daily.  Medical compression stocking with direct skin contact.  Elevate for swelling return for any worsening  Follow-Up Instructions: No follow-ups on file.   Imaging: No results found.  Orders:  No orders of the defined types were placed in this encounter.  No orders of the defined types were placed in this encounter.    PMFS History: Patient Active Problem List   Diagnosis Date Noted   Ankle fracture, right, closed, initial encounter    Family history of early CAD 10/20/2019   Other hyperlipidemia 08/08/2019   Insulin resistance 02/16/2019   Vitamin D deficiency 01/09/2019   Numbness and tingling in left hand 01/09/2019   Routine general medical examination at a health care facility 09/28/2017   Obesity 11/22/2015   Essential hypertension 04/30/2015   Depression 11/27/2013   Fibroids 11/27/2013   Past Medical  History:  Diagnosis Date   Anxiety    Arthritis    Depression 09/2001   Elevated BP    Elevated LDL cholesterol level 09/2012   Feeling lonely    History of uterine fibroid 11/2002   HTN (hypertension)    Joint pain    Leg edema    Obesity    Obstructive sleep apnea     Family History  Problem Relation Age of Onset   Hypertension Mother    Hyperlipidemia Mother    Depression Mother    Obesity Mother    Hypertension Father    Heart attack Father    Obesity Father    Hyperlipidemia Father    Heart disease Father    Sudden death Daughter    Colon cancer Maternal Grandmother     Past Surgical History:  Procedure Laterality Date   BELPHAROPTOSIS REPAIR     COLONOSCOPY     ORIF ANKLE FRACTURE Right 04/23/2021   Procedure: OPEN REDUCTION INTERNAL FIXATION (ORIF) RIGHT ANKLE FRACTURE;  Surgeon: Newt Minion, MD;  Location: [facility];  Service: Orthopedics;  Laterality: Right;   Social History   Occupational History   Occupation: Naval architect  Tobacco Use   Smoking status: Never   Smokeless tobacco: Never  Vaping Use   Vaping Use: Never used  Substance and Sexual Activity   Alcohol use: Not Currently   Drug use: No   Sexual activity: Not Currently  Partners: Male    Birth control/protection: Abstinence

## 2021-06-13 ENCOUNTER — Encounter: Payer: Managed Care, Other (non HMO) | Admitting: Family

## 2021-07-14 ENCOUNTER — Ambulatory Visit (INDEPENDENT_AMBULATORY_CARE_PROVIDER_SITE_OTHER): Payer: Managed Care, Other (non HMO) | Admitting: *Deleted

## 2021-07-14 ENCOUNTER — Other Ambulatory Visit: Payer: Self-pay

## 2021-07-14 ENCOUNTER — Other Ambulatory Visit (HOSPITAL_BASED_OUTPATIENT_CLINIC_OR_DEPARTMENT_OTHER): Payer: Self-pay | Admitting: Obstetrics & Gynecology

## 2021-07-14 ENCOUNTER — Encounter (HOSPITAL_BASED_OUTPATIENT_CLINIC_OR_DEPARTMENT_OTHER): Payer: Self-pay

## 2021-07-14 ENCOUNTER — Other Ambulatory Visit (HOSPITAL_COMMUNITY)
Admission: RE | Admit: 2021-07-14 | Discharge: 2021-07-14 | Disposition: A | Discharge status: Home / Self Care | Payer: Managed Care, Other (non HMO) | Source: Ambulatory Visit | Attending: Obstetrics & Gynecology | Admitting: Obstetrics & Gynecology

## 2021-07-14 VITALS — Temp 98.8°F

## 2021-07-14 DIAGNOSIS — N760 Acute vaginitis: Secondary | ICD-10-CM | POA: Insufficient documentation

## 2021-07-14 DIAGNOSIS — L292 Pruritus vulvae: Secondary | ICD-10-CM

## 2021-07-14 DIAGNOSIS — B9689 Other specified bacterial agents as the cause of diseases classified elsewhere: Secondary | ICD-10-CM | POA: Insufficient documentation

## 2021-07-14 DIAGNOSIS — R8290 Unspecified abnormal findings in urine: Secondary | ICD-10-CM

## 2021-07-14 LAB — POCT URINALYSIS DIPSTICK
Appearance: NORMAL
Bilirubin, UA: NEGATIVE
Glucose, UA: NEGATIVE
Ketones, UA: NEGATIVE
Leukocytes, UA: NEGATIVE
Nitrite, UA: NEGATIVE
Odor: ABNORMAL
Protein, UA: NEGATIVE
Urobilinogen, UA: 0.2 E.U./dL
pH, UA: 5.5 (ref 5.0–8.0)

## 2021-07-14 MED ORDER — SULFAMETHOXAZOLE-TRIMETHOPRIM 800-160 MG PO TABS: 1.0000 | ORAL_TABLET | Freq: Two times a day (BID) | ORAL | 0 refills | Status: DC

## 2021-07-14 NOTE — Progress Notes (Signed)
Pt here with complaints of foul smelling urine, frequency, and feeling like she has a UTI. Urine sample obtained. Pt also complains of itching. Pt performed self swab. Advised prescription of bactrim sent to pharmacy.  Advised we would call with results of aptima test. Pt verbalized understanding.

## 2021-07-14 NOTE — Progress Notes (Signed)
Urine culture and antibiotics sent to pharmacy on file.

## 2021-07-14 NOTE — Telephone Encounter (Signed)
Spoke with pt regarding myChart message. Pt states that she hasn't been feeling well. She has urinary urgency with a  strong ammonia smell and some itching. She also c/o low grade fever of 99.5. Pt given appt to provide [place] with urine sample for testing.

## 2021-07-15 LAB — CERVICOVAGINAL ANCILLARY ONLY
Bacterial Vaginitis (gardnerella): POSITIVE — AB
Candida Glabrata: NEGATIVE
Candida Vaginitis: NEGATIVE
Comment: NEGATIVE
Comment: NEGATIVE
Comment: NEGATIVE

## 2021-07-18 LAB — URINE CULTURE

## 2021-07-29 ENCOUNTER — Telehealth (HOSPITAL_BASED_OUTPATIENT_CLINIC_OR_DEPARTMENT_OTHER): Payer: Self-pay | Admitting: *Deleted

## 2021-07-29 NOTE — Telephone Encounter (Signed)
Pt called to say that she had been treated for a UTI a couple of weeks ago. She took the medication and her symptoms seemed to improve for a day or two. Pt states that she is still having itching and strong odor. Pt to come in for repeat urine and aptima swab, since last swab showed BV.

## 2021-07-30 ENCOUNTER — Ambulatory Visit (INDEPENDENT_AMBULATORY_CARE_PROVIDER_SITE_OTHER): Payer: Managed Care, Other (non HMO) | Admitting: *Deleted

## 2021-07-30 ENCOUNTER — Other Ambulatory Visit: Payer: Self-pay

## 2021-07-30 ENCOUNTER — Other Ambulatory Visit (HOSPITAL_BASED_OUTPATIENT_CLINIC_OR_DEPARTMENT_OTHER): Payer: Self-pay

## 2021-07-30 DIAGNOSIS — R8290 Unspecified abnormal findings in urine: Secondary | ICD-10-CM

## 2021-07-30 LAB — POCT URINALYSIS DIPSTICK
Appearance: NORMAL
Bilirubin, UA: NEGATIVE
Glucose, UA: NEGATIVE
Ketones, UA: NEGATIVE
Leukocytes, UA: NEGATIVE
Nitrite, UA: NEGATIVE
Protein, UA: NEGATIVE
Spec Grav, UA: 1.025 (ref 1.010–1.025)
Urobilinogen, UA: 0.2 E.U./dL
pH, UA: 5.5 (ref 5.0–8.0)

## 2021-07-30 IMAGING — MG MM DIGITAL SCREENING BILAT W/ TOMO AND CAD
8 series · 8 of 24 positions shown · non-contrast
Comparison: Previous exam(s).

CLINICAL DATA: Screening.

EXAM:
DIGITAL SCREENING BILATERAL MAMMOGRAM WITH TOMOSYNTHESIS AND CAD
TECHNIQUE: Bilateral screening digital craniocaudal and mediolateral oblique
mammograms were obtained. Bilateral screening digital breast
tomosynthesis was performed. The images were evaluated with
computer-aided detection.

[L MLO synth-2D]
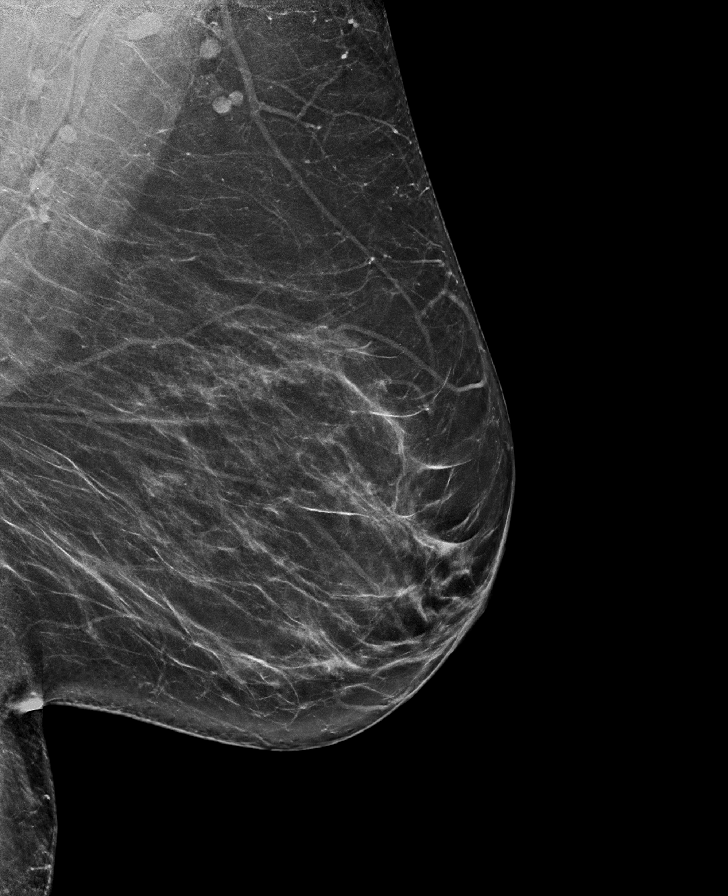

[L CC synth-2D]
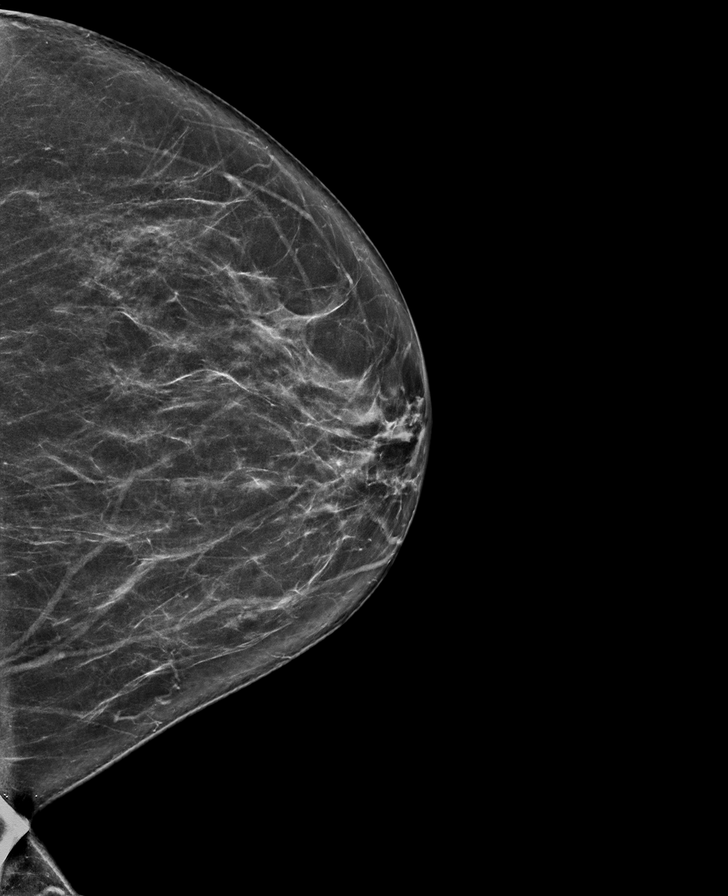

[R CC synth-2D]
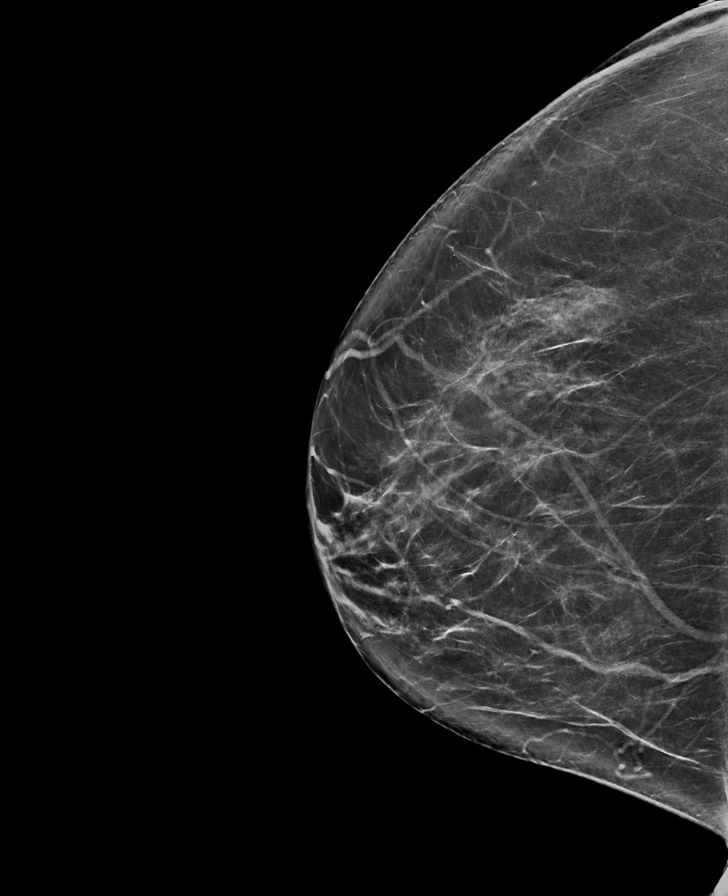

[R MLO synth-2D]
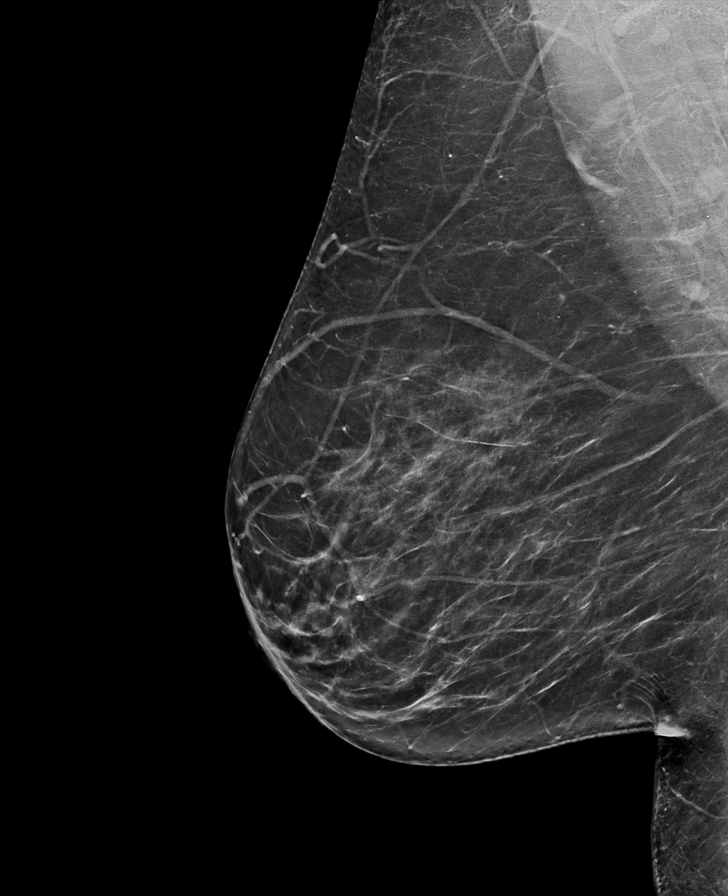

[R MLO tomo · tomo slice 41/81.0]
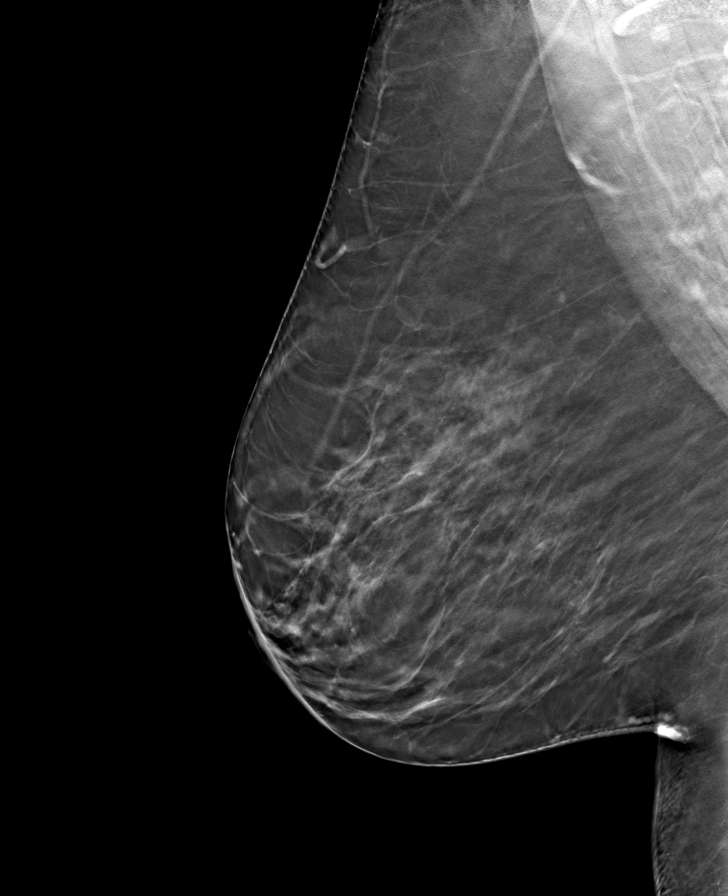

[R CC tomo · tomo slice 39/77.0]
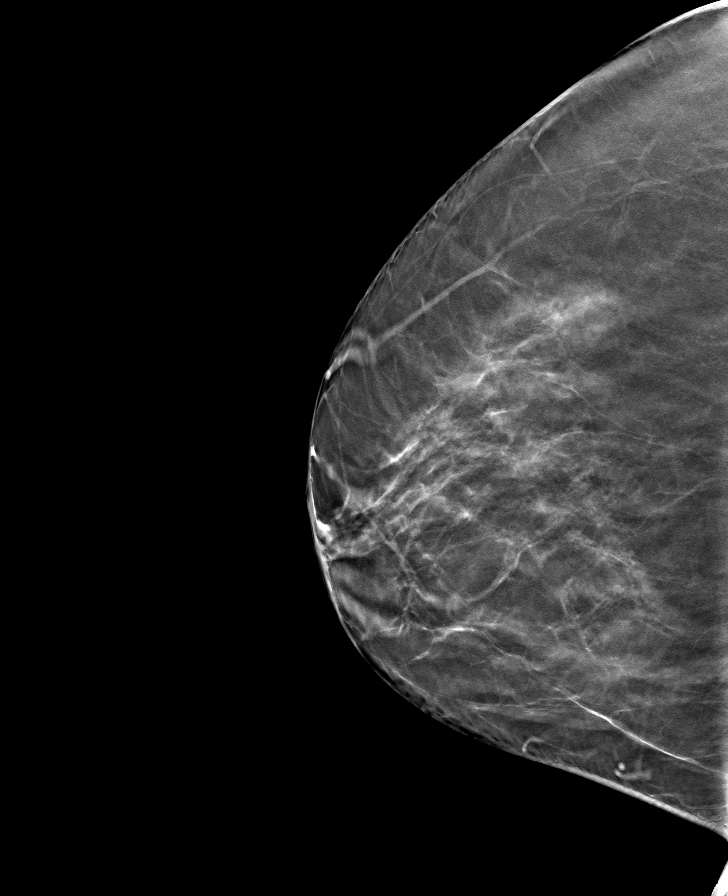

[L MLO tomo · tomo slice 42/83.0]
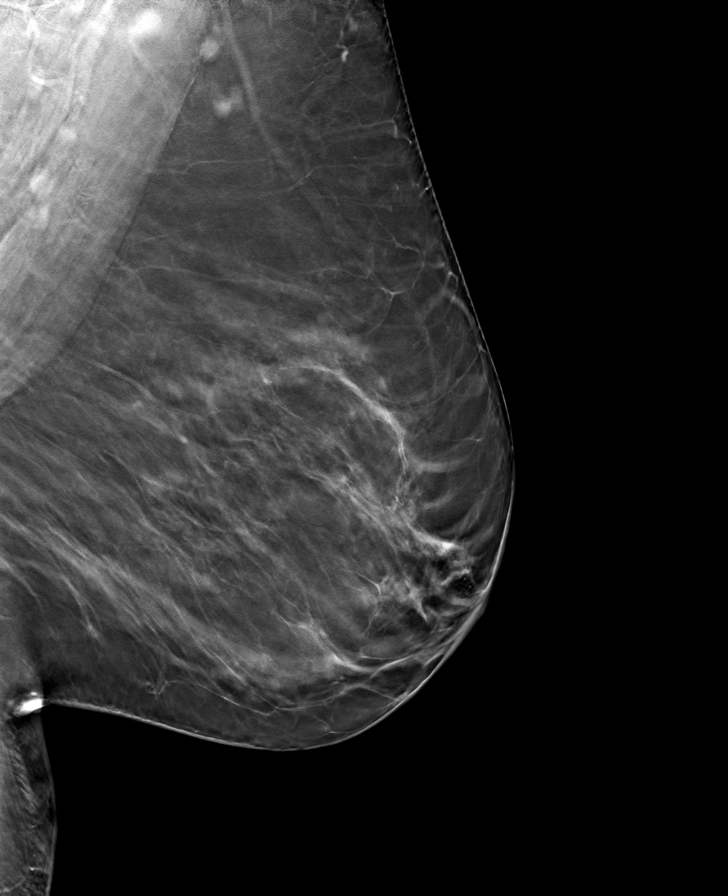

[L CC tomo · tomo slice 39/78.0]
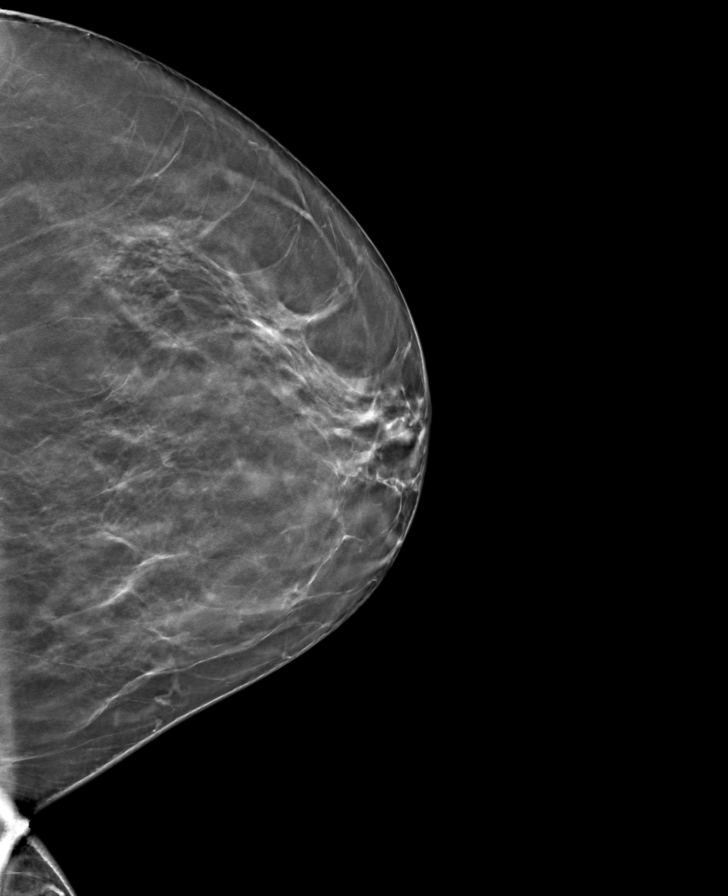

[8 of 24 positions shown; findings below may reference images not displayed]

ACR Breast Density Category c: The breast tissue is heterogeneously
dense, which may obscure small masses.
FINDINGS: There are no findings suspicious for malignancy. The images were
evaluated with computer-aided detection.
IMPRESSION: No mammographic evidence of malignancy. A result letter of this
screening mammogram will be mailed directly to the patient.

RECOMMENDATION:
Screening mammogram in one year. (Code:T4-5-GWO)

BI-RADS CATEGORY  1: Negative.

## 2021-07-30 MED ORDER — METRONIDAZOLE 500 MG PO TABS: 500.0000 mg | ORAL_TABLET | Freq: Two times a day (BID) | ORAL | 0 refills | Status: AC

## 2021-07-30 MED ORDER — FLUCONAZOLE 150 MG PO TABS: 150.0000 mg | ORAL_TABLET | Freq: Once | ORAL | 0 refills | Status: AC

## 2021-07-30 MED ORDER — INFLUENZA VAC SPLIT QUAD 0.5 ML IM SUSY
PREFILLED_SYRINGE | INTRAMUSCULAR | 0 refills | Status: DC
  Filled 2021-07-30: qty 0.5, 1d supply, fill #0

## 2021-08-18 ENCOUNTER — Other Ambulatory Visit: Payer: Self-pay

## 2021-08-18 ENCOUNTER — Ambulatory Visit (INDEPENDENT_AMBULATORY_CARE_PROVIDER_SITE_OTHER): Payer: Managed Care, Other (non HMO) | Admitting: Orthopedic Surgery

## 2021-08-18 ENCOUNTER — Encounter: Payer: Self-pay | Admitting: Orthopedic Surgery

## 2021-08-18 ENCOUNTER — Ambulatory Visit (INDEPENDENT_AMBULATORY_CARE_PROVIDER_SITE_OTHER): Payer: Managed Care, Other (non HMO)

## 2021-08-18 DIAGNOSIS — Z8781 Personal history of (healed) traumatic fracture: Secondary | ICD-10-CM | POA: Diagnosis not present

## 2021-08-18 DIAGNOSIS — Z9889 Other specified postprocedural states: Secondary | ICD-10-CM

## 2021-08-18 NOTE — Progress Notes (Signed)
Office Visit Note   Patient: Beth Rivers           Date of Birth: 05-10-1956           MRN: [id number] Visit Date: 08/18/2021              Requested by: Colonel Bald, MD [address] [place],  [place] [address] PCP: Colonel Bald, MD  Chief Complaint  Patient presents with   Right Ankle - Follow-up    S/p  ORIF      HPI: Patient is 3 and half months status post open reduction internal fixation right ankle she states she is doing well just feels weak.  Assessment & Plan: Visit Diagnoses:  1. S/P ORIF (open reduction internal fixation) fracture     Plan: Patient was given instructions for toe raise strengthening recommend she use her [employer] bike  Follow-Up Instructions: No follow-ups on file.   Ortho Exam  Patient is alert, oriented, no adenopathy, well-dressed, normal affect, normal respiratory effort. Examination patient has excellent range of motion of the ankle there is no swelling no pain with range of motion she has a normal gait.  Imaging: XR Ankle Complete Right  Result Date: 08/18/2021 Three-view radiographs of the right ankle shows a congruent mortise a well-healed well aligned Weber B fibular fracture.  No images are attached to the encounter.  Labs: Lab Results  Component Value Date   HGBA1C 5.2 05/01/2020   HGBA1C 5.1 01/09/2020   HGBA1C 5.1 08/01/2019   LABORGA NO GROWTH 01/25/2015     Lab Results  Component Value Date   ALBUMIN 4.4 05/01/2020   ALBUMIN 4.2 01/09/2020   ALBUMIN 4.3 08/01/2019    No results found for: MG Lab Results  Component Value Date   VD25OH 53.1 05/01/2020   VD25OH 64.5 01/09/2020   VD25OH 55.6 08/01/2019    No results found for: PREALBUMIN CBC EXTENDED Latest Ref Rng & Units 04/23/2021 07/11/2018 09/27/2017  WBC 4.0 - 10.5 K/uL 6.1 6.2 9.9  RBC 3.87 - 5.11 MIL/uL 4.91 4.56 4.90  HGB 12.0 - 15.0 g/dL 15.4(H) 14.3 15.9(H)  HCT 36.0 - 46.0 % 47.2(H) 42.8 46.3(H)  PLT 150 - 400 K/uL 247 - 253.0   NEUTROABS 1.4 - 7.0 x10E3/uL - 3.9 -  LYMPHSABS 0.7 - 3.1 x10E3/uL - 1.7 -     There is no height or weight on file to calculate BMI.  Orders:  Orders Placed This Encounter  Procedures   XR Ankle Complete Right   No orders of the defined types were placed in this encounter.    Procedures: No procedures performed  Clinical Data: No additional findings.  ROS:  All other systems negative, except as noted in the HPI. Review of Systems  Objective: Vital Signs: LMP 09/22/2003   Specialty Comments:  No specialty comments available.  PMFS History: Patient Active Problem List   Diagnosis Date Noted   Ankle fracture, right, closed, initial encounter    Family history of early CAD 10/20/2019   Other hyperlipidemia 08/08/2019   Insulin resistance 02/16/2019   Vitamin D deficiency 01/09/2019   Numbness and tingling in left hand 01/09/2019   Routine general medical examination at a health care facility 09/28/2017   Obesity 11/22/2015   Essential hypertension 04/30/2015   Depression 11/27/2013   Fibroids 11/27/2013   Past Medical History:  Diagnosis Date   Anxiety    Arthritis    Depression 09/2001   Elevated BP    Elevated  LDL cholesterol level 09/2012   Feeling lonely    History of uterine fibroid 11/2002   HTN (hypertension)    Joint pain    Leg edema    Obesity    Obstructive sleep apnea     Family History  Problem Relation Age of Onset   Hypertension Mother    Hyperlipidemia Mother    Depression Mother    Obesity Mother    Hypertension Father    Heart attack Father    Obesity Father    Hyperlipidemia Father    Heart disease Father    Sudden death Daughter    Colon cancer Maternal Grandmother     Past Surgical History:  Procedure Laterality Date   BELPHAROPTOSIS REPAIR     COLONOSCOPY     ORIF ANKLE FRACTURE Right 04/23/2021   Procedure: OPEN REDUCTION INTERNAL FIXATION (ORIF) RIGHT ANKLE FRACTURE;  Surgeon: Newt Minion, MD;  Location: [facility];   Service: Orthopedics;  Laterality: Right;   Social History   Occupational History   Occupation: Naval architect  Tobacco Use   Smoking status: Never   Smokeless tobacco: Never  Vaping Use   Vaping Use: Never used  Substance and Sexual Activity   Alcohol use: Not Currently   Drug use: No   Sexual activity: Not Currently    Partners: Male    Birth control/protection: Abstinence

## 2021-10-02 ENCOUNTER — Ambulatory Visit: Payer: Managed Care, Other (non HMO) | Admitting: Orthopedic Surgery

## 2021-12-26 ENCOUNTER — Ambulatory Visit (HOSPITAL_BASED_OUTPATIENT_CLINIC_OR_DEPARTMENT_OTHER): Payer: BC Managed Care – PPO | Admitting: Obstetrics & Gynecology

## 2022-01-06 ENCOUNTER — Other Ambulatory Visit (HOSPITAL_BASED_OUTPATIENT_CLINIC_OR_DEPARTMENT_OTHER): Payer: Self-pay | Admitting: Obstetrics & Gynecology

## 2022-01-06 ENCOUNTER — Encounter (HOSPITAL_BASED_OUTPATIENT_CLINIC_OR_DEPARTMENT_OTHER): Payer: Self-pay | Admitting: Obstetrics & Gynecology

## 2022-01-06 ENCOUNTER — Ambulatory Visit (INDEPENDENT_AMBULATORY_CARE_PROVIDER_SITE_OTHER): Payer: Managed Care, Other (non HMO) | Admitting: Obstetrics & Gynecology

## 2022-01-06 VITALS — BP 128/69 | HR 66 | Ht 63.0 in | Wt 229.2 lb

## 2022-01-06 DIAGNOSIS — E7849 Other hyperlipidemia: Secondary | ICD-10-CM

## 2022-01-06 DIAGNOSIS — Z780 Asymptomatic menopausal state: Secondary | ICD-10-CM

## 2022-01-06 DIAGNOSIS — Z1231 Encounter for screening mammogram for malignant neoplasm of breast: Secondary | ICD-10-CM

## 2022-01-06 DIAGNOSIS — M8580 Other specified disorders of bone density and structure, unspecified site: Secondary | ICD-10-CM

## 2022-01-06 DIAGNOSIS — Z01419 Encounter for gynecological examination (general) (routine) without abnormal findings: Secondary | ICD-10-CM

## 2022-01-06 DIAGNOSIS — I10 Essential (primary) hypertension: Secondary | ICD-10-CM

## 2022-01-06 DIAGNOSIS — Z8249 Family history of ischemic heart disease and other diseases of the circulatory system: Secondary | ICD-10-CM

## 2022-01-06 MED ORDER — CLOBETASOL PROPIONATE 0.05 % EX OINT: 1.0000 "application " | TOPICAL_OINTMENT | Freq: Every day | CUTANEOUS | 0 refills | Status: DC

## 2022-01-06 NOTE — Progress Notes (Signed)
66 y.o. G2P2001 Legally Separated White or Caucasian female here for annual exam. Frustrated with weight.  Denies vaginal bleeding.  Had lab work with PCP.  Lipids were 229, triglycerides were 238 and LDL 138.  HbA1C was 5.4.    ? ?Patient's last menstrual period was 09/22/2003.          ?Sexually active: No.  ?The current method of family planning is post menopausal status.    ?Smoker:  no ? ?Health Maintenance: ?Pap:  12/19/2020 Negative ?History of abnormal Pap:  no ?MMG:  12/19/2020 Negative.  This is scheduled ?Colonoscopy:  03/12/2015, Dr. Watt Climes.  Follow up due 5 years.  Pt called [facility] when she was here today to confirm this.  Follow up appt scheduled. ?BMD:   10/03/2018 Osteopenia.  Did have a BMD done with PCP two weeks.   ?Screening Labs: Reviewed results from PCP done in March. ? ? reports that she has never smoked. She has never used smokeless tobacco. She reports that she does not currently use alcohol. She reports that she does not use drugs. ? ?Past Medical History:  ?Diagnosis Date  ? Anxiety   ? Arthritis   ? Depression 09/2001  ? Elevated BP   ? Elevated LDL cholesterol level 09/2012  ? Feeling lonely   ? History of uterine fibroid 11/2002  ? HTN (hypertension)   ? Joint pain   ? Leg edema   ? Obesity   ? Obstructive sleep apnea   ? ? ?Past Surgical History:  ?Procedure Laterality Date  ? [place]    ? COLONOSCOPY    ? ORIF ANKLE FRACTURE Right 04/23/2021  ? Procedure: OPEN REDUCTION INTERNAL FIXATION (ORIF) RIGHT ANKLE FRACTURE;  Surgeon: Newt Minion, MD;  Location: [place];  Service: Orthopedics;  Laterality: Right;  ? ? ?Current Outpatient Medications  ?Medication Sig Dispense Refill  ? acetaminophen (TYLENOL) 500 MG tablet Take 500 mg by mouth every 6 (six) hours as needed.    ? fenofibrate (TRICOR) 145 MG tablet Take 145 mg by mouth daily.    ? sertraline (ZOLOFT) 50 MG tablet Take 50 mg by mouth daily.    ? triamterene-hydrochlorothiazide (MAXZIDE-25) 37.5-25 MG tablet Take 1  tablet by mouth daily. 30 tablet 0  ? ?No current facility-administered medications for this visit.  ? ? ?Family History  ?Problem Relation Age of Onset  ? Hypertension Mother   ? Hyperlipidemia Mother   ? Depression Mother   ? Obesity Mother   ? Hypertension Father   ? Heart attack Father   ? Obesity Father   ? Hyperlipidemia Father   ? Heart disease Father   ? Sudden death Daughter   ? Colon cancer Maternal Grandmother   ? ? ?Review of Systems  ?All other systems reviewed and are negative. ? ?Exam:   ?BP 128/69 (BP Location: Right Arm, Patient Position: Sitting, Cuff Size: Large)   Pulse 66   Ht '5\' 3"'$  (1.6 m) Comment: reported  Wt 229 lb 3.2 oz (104 kg)   LMP 09/22/2003   BMI 40.60 kg/m?   Height: '5\' 3"'$  (160 cm) (reported) ? ?General appearance: alert, cooperative and appears stated age ?Head: Normocephalic, without obvious abnormality, atraumatic ?Neck: no adenopathy, supple, symmetrical, trachea midline and thyroid normal to inspection and palpation ?Lungs: clear to auscultation bilaterally ?Breasts: normal appearance, no masses or tenderness ?Heart: regular rate and rhythm ?Abdomen: soft, non-tender; bowel sounds normal; no masses,  no organomegaly ?Extremities: extremities normal, atraumatic, no cyanosis or edema ?  Skin: Skin color, texture, turgor normal. No rashes or lesions ?Lymph nodes: Cervical, supraclavicular, and axillary nodes normal. ?No abnormal inguinal nodes palpated ?Neurologic: Grossly normal ? ? ?Pelvic: External genitalia:  no lesions ?             Urethra:  normal appearing urethra with no masses, tenderness or lesions ?             Bartholins and Skenes: normal    ?             Vagina: normal appearing vagina with normal color and no discharge, no lesions ?             Cervix: no lesions ?             Pap taken: No. ?Bimanual Exam:  Uterus:  normal size, contour, position, consistency, mobility, non-tender ?             Adnexa: normal adnexa and no mass, fullness, tenderness ?               Rectovaginal: Confirms ?              Anus:  normal sphincter tone, no lesions ? ?Chaperone, Octaviano Batty, CMA, was present for exam. ? ?Assessment/Plan: ?1. Well woman exam with routine gynecological exam ?- pap with neg HR HPV 2022 ?- MMG scheduled for Friday ?- Colonoscopy referral is scheduled ?- release will be signed for BMD ?- lab work done with PCP ?- vaccines reviewed/updated ? ?2. Postmenopausal ?- no HRT ? ?3. Osteopenia, unspecified location ? ?4. Primary hypertension ? ?5. Other hyperlipidemia ? ?6. Family history of early CAD ? ?7.  Vulvar itching ?- Clobetasol 0.05% ointment nightly x 3 weeks.  Will recheck then.  May need a biopsy. ? ? ?

## 2022-01-06 NOTE — Progress Notes (Deleted)
66 y.o. G2P2001 Legally Separated White or Caucasian female here for annual exam.   ? ?Patient's last menstrual period was 09/22/2003.          ?Sexually active: {yes no:314532}  ?The current method of family planning is {contraception:315051}.    ? ?The pregnancy intention screening data noted above was reviewed. Potential methods of contraception were discussed. The patient elected to proceed with No data recorded.  ?Exercising: {yes no:314532}  {types:19826} ?Smoker:  {YES NO:22349} ? ?Health Maintenance: ?Pap:  12/19/2020 Negative ?History of abnormal Pap:  {YES NO:22349} ?MMG:  12/19/2020 Negative ?Colonoscopy:  03/12/2015 ?BMD:   10/03/2018 Osteopenia ?Screening Labs: *** ? ? reports that she has never smoked. She has never used smokeless tobacco. She reports that she does not currently use alcohol. She reports that she does not use drugs. ? ?Past Medical History:  ?Diagnosis Date  ? Anxiety   ? Arthritis   ? Depression 09/2001  ? Elevated BP   ? Elevated LDL cholesterol level 09/2012  ? Feeling lonely   ? History of uterine fibroid 11/2002  ? HTN (hypertension)   ? Joint pain   ? Leg edema   ? Obesity   ? Obstructive sleep apnea   ? ? ?Past Surgical History:  ?Procedure Laterality Date  ? [place]    ? COLONOSCOPY    ? ORIF ANKLE FRACTURE Right 04/23/2021  ? Procedure: OPEN REDUCTION INTERNAL FIXATION (ORIF) RIGHT ANKLE FRACTURE;  Surgeon: Newt Minion, MD;  Location: [place];  Service: Orthopedics;  Laterality: Right;  ? ? ?Current Outpatient Medications  ?Medication Sig Dispense Refill  ? acetaminophen (TYLENOL) 500 MG tablet Take 500 mg by mouth every 6 (six) hours as needed.    ? fenofibrate (TRICOR) 145 MG tablet Take 145 mg by mouth daily.    ? influenza vac split quadrivalent PF (FLUARIX) 0.5 ML injection Inject into the muscle. 0.5 mL 0  ? sertraline (ZOLOFT) 50 MG tablet Take 50 mg by mouth daily.    ? sulfamethoxazole-trimethoprim (BACTRIM DS) 800-160 MG tablet Take 1 tablet by mouth 2  (two) times daily. (Patient not taking: Reported on 07/30/2021) 6 tablet 0  ? triamterene-hydrochlorothiazide (MAXZIDE-25) 37.5-25 MG tablet Take 1 tablet by mouth daily. 30 tablet 0  ? Vitamin D, Ergocalciferol, (DRISDOL) 1.25 MG (50000 UNIT) CAPS capsule Take 50,000 Units by mouth every Thursday.    ? ?No current facility-administered medications for this visit.  ? ? ?Family History  ?Problem Relation Age of Onset  ? Hypertension Mother   ? Hyperlipidemia Mother   ? Depression Mother   ? Obesity Mother   ? Hypertension Father   ? Heart attack Father   ? Obesity Father   ? Hyperlipidemia Father   ? Heart disease Father   ? Sudden death Daughter   ? Colon cancer Maternal Grandmother   ? ? ?Review of Systems ? ?Exam:   ?LMP 09/22/2003     ? ?General appearance: alert, cooperative and appears stated age ?Head: Normocephalic, without obvious abnormality, atraumatic ?Neck: no adenopathy, supple, symmetrical, trachea midline and thyroid {EXAM; THYROID:18604} ?Lungs: clear to auscultation bilaterally ?Breasts: {Exam; breast:13139::"normal appearance, no masses or tenderness"} ?Heart: regular rate and rhythm ?Abdomen: soft, non-tender; bowel sounds normal; no masses,  no organomegaly ?Extremities: extremities normal, atraumatic, no cyanosis or edema ?Skin: Skin color, texture, turgor normal. No rashes or lesions ?Lymph nodes: Cervical, supraclavicular, and axillary nodes normal. ?No abnormal inguinal nodes palpated ?Neurologic: Grossly normal ? ? ?Pelvic: External genitalia:  no  lesions ?             Urethra:  normal appearing urethra with no masses, tenderness or lesions ?             Bartholins and Skenes: normal    ?             Vagina: normal appearing vagina with normal color and no discharge, no lesions ?             Cervix: {exam; cervix:14595} ?             Pap taken: {yes no:314532} ?Bimanual Exam:  Uterus:  {exam; uterus:12215} ?             Adnexa: {exam; adnexa:12223} ?              Rectovaginal: Confirms ?               Anus:  normal sphincter tone, no lesions ? ?Chaperone, ***, CMA, was present for exam. ? ?Assessment/Plan: ? ? ?

## 2022-01-09 ENCOUNTER — Encounter (HOSPITAL_BASED_OUTPATIENT_CLINIC_OR_DEPARTMENT_OTHER): Payer: Self-pay | Admitting: Radiology

## 2022-01-09 ENCOUNTER — Other Ambulatory Visit (HOSPITAL_BASED_OUTPATIENT_CLINIC_OR_DEPARTMENT_OTHER): Payer: Self-pay | Admitting: *Deleted

## 2022-01-09 ENCOUNTER — Ambulatory Visit (HOSPITAL_BASED_OUTPATIENT_CLINIC_OR_DEPARTMENT_OTHER): Payer: BC Managed Care – PPO | Admitting: Obstetrics & Gynecology

## 2022-01-09 ENCOUNTER — Ambulatory Visit (HOSPITAL_BASED_OUTPATIENT_CLINIC_OR_DEPARTMENT_OTHER)
Admission: RE | Admit: 2022-01-09 | Discharge: 2022-01-09 | Disposition: A | Discharge status: Home / Self Care | Payer: Managed Care, Other (non HMO) | Source: Ambulatory Visit | Attending: Obstetrics & Gynecology | Admitting: Obstetrics & Gynecology

## 2022-01-09 DIAGNOSIS — Z1231 Encounter for screening mammogram for malignant neoplasm of breast: Secondary | ICD-10-CM | POA: Insufficient documentation

## 2022-01-09 DIAGNOSIS — Z780 Asymptomatic menopausal state: Secondary | ICD-10-CM | POA: Insufficient documentation

## 2022-01-22 ENCOUNTER — Ambulatory Visit (INDEPENDENT_AMBULATORY_CARE_PROVIDER_SITE_OTHER): Payer: Managed Care, Other (non HMO) | Admitting: Obstetrics & Gynecology

## 2022-01-22 ENCOUNTER — Encounter (HOSPITAL_BASED_OUTPATIENT_CLINIC_OR_DEPARTMENT_OTHER): Payer: Self-pay | Admitting: Obstetrics & Gynecology

## 2022-01-22 ENCOUNTER — Other Ambulatory Visit (HOSPITAL_BASED_OUTPATIENT_CLINIC_OR_DEPARTMENT_OTHER): Payer: Self-pay

## 2022-01-22 ENCOUNTER — Other Ambulatory Visit (HOSPITAL_COMMUNITY)
Admission: RE | Admit: 2022-01-22 | Discharge: 2022-01-22 | Disposition: A | Discharge status: Home / Self Care | Payer: Managed Care, Other (non HMO) | Source: Ambulatory Visit | Attending: Obstetrics & Gynecology | Admitting: Obstetrics & Gynecology

## 2022-01-22 VITALS — BP 123/70 | HR 76 | Ht 63.0 in | Wt 223.8 lb

## 2022-01-22 DIAGNOSIS — N9089 Other specified noninflammatory disorders of vulva and perineum: Secondary | ICD-10-CM | POA: Diagnosis not present

## 2022-01-22 MED ORDER — CLOBETASOL PROPIONATE 0.05 % EX OINT
1.0000 "application " | TOPICAL_OINTMENT | Freq: Every day | CUTANEOUS | 0 refills | Status: DC
  Filled 2022-01-22: qty 60, 14d supply, fill #0

## 2022-01-22 NOTE — Progress Notes (Signed)
GYNECOLOGY  VISIT ? ?CC:   follow up ? ?HPI: ?66 y.o. G16P2001 Legally Separated White or Caucasian female here for recheck of ulcerated vulvar lesion noted at last visit.  Topical steroid was prescribed.  Pt never got this filled.  Almost cancelled appt today as hasn't really done anything for treatment.  Recommended recheck today and if there is no significant change, proceed with vulvar biopsy today.  Pt is comfortable with plan. ? ?LMP 2005. ? ?Patient Active Problem List  ? Diagnosis Date Noted  ? Ankle fracture, right, closed, initial encounter   ? Family history of early CAD 10/20/2019  ? Other hyperlipidemia 08/08/2019  ? Insulin resistance 02/16/2019  ? Vitamin D deficiency 01/09/2019  ? Numbness and tingling in left hand 01/09/2019  ? Routine general medical examination at a health care facility 09/28/2017  ? Obesity 11/22/2015  ? Essential hypertension 04/30/2015  ? Depression 11/27/2013  ? Fibroids 11/27/2013  ? ? ?Past Medical History:  ?Diagnosis Date  ? Anxiety   ? Arthritis   ? Depression 09/2001  ? Elevated BP   ? Elevated LDL cholesterol level 09/2012  ? Feeling lonely   ? History of uterine fibroid 11/2002  ? HTN (hypertension)   ? Joint pain   ? Leg edema   ? Obesity   ? Obstructive sleep apnea   ? ? ?Past Surgical History:  ?Procedure Laterality Date  ? [place]    ? COLONOSCOPY    ? ORIF ANKLE FRACTURE Right 04/23/2021  ? Procedure: OPEN REDUCTION INTERNAL FIXATION (ORIF) RIGHT ANKLE FRACTURE;  Surgeon: Newt Minion, MD;  Location: [facility];  Service: Orthopedics;  Laterality: Right;  ? ? ?MEDS:   ?Current Outpatient Medications on File Prior to Visit  ?Medication Sig Dispense Refill  ? acetaminophen (TYLENOL) 500 MG tablet Take 500 mg by mouth every 6 (six) hours as needed.    ? fenofibrate (TRICOR) 145 MG tablet Take 145 mg by mouth daily.    ? sertraline (ZOLOFT) 50 MG tablet Take 50 mg by mouth daily.    ? triamterene-hydrochlorothiazide (MAXZIDE-25) 37.5-25 MG tablet Take 1 tablet  by mouth daily. 30 tablet 0  ? ?No current facility-administered medications on file prior to visit.  ? ? ?ALLERGIES: Patient has no known allergies. ? ?Family History  ?Problem Relation Age of Onset  ? Hypertension Mother   ? Hyperlipidemia Mother   ? Depression Mother   ? Obesity Mother   ? Hypertension Father   ? Heart attack Father   ? Obesity Father   ? Hyperlipidemia Father   ? Heart disease Father   ? Sudden death Daughter   ? Colon cancer Maternal Grandmother   ? ? ?SH:  separated, non smoker ? ?Review of Systems  ?Genitourinary: Negative.   ? ?PHYSICAL EXAMINATION:   ? ?BP 123/70 (BP Location: Right Arm, Patient Position: Sitting, Cuff Size: Normal)   Pulse 76   Ht '5\' 3"'$  (1.6 m) Comment: reported  Wt 223 lb 12.8 oz (101.5 kg)   LMP 09/22/2003   BMI 39.64 kg/m?     ?General appearance: alert, cooperative and appears stated age ?Lymph:  no inguinal LAD noted ? ?Pelvic: External genitalia: continued ulceration on right mid labia majora ?             Urethra:  normal appearing urethra with no masses, tenderness or lesions ?             Bartholins and Skenes: normal    ?              ?  Biopsy recommended.  Consent obtained.  Procedure:  Area cleansed with Betadine.  Sterile technique used throughout procedure.  Skin anesthestized with Lidocaine 1% plain; 31m.   425mpunch biopsy used to obtain specimen.  Biopsy grasped with pick-ups and excised with scissors.  Adequate hemostasis obtained with silver nitrate sticks.  Dressing was not applied.  Pt tolerated procedure well. ? ?Chaperone, ToOctaviano BattyCMA, was present for exam. ? ?Assessment/Plan: ?1. Vulvar lesion ?- Surgical pathology( [place]/ [facility]) ?- results will be called to pt and additional recommendations will be made at that time ?- [place] [facility] once biopsy is finalized ? ? ?

## 2022-01-23 LAB — SURGICAL PATHOLOGY

## 2022-03-30 ENCOUNTER — Encounter (HOSPITAL_BASED_OUTPATIENT_CLINIC_OR_DEPARTMENT_OTHER): Payer: Self-pay | Admitting: *Deleted

## 2022-04-29 ENCOUNTER — Encounter (INDEPENDENT_AMBULATORY_CARE_PROVIDER_SITE_OTHER): Payer: Self-pay

## 2023-01-22 ENCOUNTER — Other Ambulatory Visit (HOSPITAL_BASED_OUTPATIENT_CLINIC_OR_DEPARTMENT_OTHER): Payer: Self-pay

## 2023-01-22 ENCOUNTER — Other Ambulatory Visit (HOSPITAL_BASED_OUTPATIENT_CLINIC_OR_DEPARTMENT_OTHER): Payer: Self-pay | Admitting: Obstetrics & Gynecology

## 2023-01-22 ENCOUNTER — Encounter (HOSPITAL_BASED_OUTPATIENT_CLINIC_OR_DEPARTMENT_OTHER): Payer: Self-pay | Admitting: Obstetrics & Gynecology

## 2023-01-22 ENCOUNTER — Ambulatory Visit (HOSPITAL_BASED_OUTPATIENT_CLINIC_OR_DEPARTMENT_OTHER): Payer: Managed Care, Other (non HMO) | Admitting: Obstetrics & Gynecology

## 2023-01-22 VITALS — BP 116/80 | HR 72 | Ht 64.0 in | Wt 203.0 lb

## 2023-01-22 DIAGNOSIS — Z01419 Encounter for gynecological examination (general) (routine) without abnormal findings: Secondary | ICD-10-CM | POA: Diagnosis not present

## 2023-01-22 DIAGNOSIS — E7849 Other hyperlipidemia: Secondary | ICD-10-CM | POA: Diagnosis not present

## 2023-01-22 DIAGNOSIS — I10 Essential (primary) hypertension: Secondary | ICD-10-CM

## 2023-01-22 DIAGNOSIS — Z780 Asymptomatic menopausal state: Secondary | ICD-10-CM

## 2023-01-22 DIAGNOSIS — L292 Pruritus vulvae: Secondary | ICD-10-CM

## 2023-01-22 DIAGNOSIS — E559 Vitamin D deficiency, unspecified: Secondary | ICD-10-CM

## 2023-01-22 DIAGNOSIS — Z1231 Encounter for screening mammogram for malignant neoplasm of breast: Secondary | ICD-10-CM

## 2023-01-22 DIAGNOSIS — Z01818 Encounter for other preprocedural examination: Secondary | ICD-10-CM

## 2023-01-22 MED ORDER — CLOBETASOL PROPIONATE 0.05 % EX OINT
1.0000 | TOPICAL_OINTMENT | Freq: Every day | CUTANEOUS | 0 refills | Status: DC
  Filled 2023-01-22: qty 60, 34d supply, fill #0

## 2023-01-22 NOTE — Progress Notes (Signed)
67 y.o. G2P2001 Legally Separated White or Caucasian female here for annual exam.  Doing well.  On Wegovy.  Seeing Dr. Rocky Link who is following her blood work.  Has gained a little weight back recently.    Denies vaginal bleeding.    Patient's last menstrual period was 09/22/2003.          Sexually active: No.  The current method of family planning is post menopausal status.    Smoker:  no  Health Maintenance: Pap:  12/19/20 History of abnormal Pap:  no MMG:  01/09/22 Colonoscopy:  03/13/22 BMD:   01/09/22 Screening Labs: doing with Dr. Louis Meckel   reports that she has never smoked. She has never used smokeless tobacco. She reports that she does not currently use alcohol. She reports that she does not use drugs.  Past Medical History:  Diagnosis Date   Anxiety    Arthritis    Depression 09/2001   Elevated BP    Elevated LDL cholesterol level 09/2012   Feeling lonely    History of uterine fibroid 11/2002   HTN (hypertension)    Joint pain    Leg edema    Obesity    Obstructive sleep apnea     Past Surgical History:  Procedure Laterality Date   BELPHAROPTOSIS REPAIR     COLONOSCOPY     ORIF ANKLE FRACTURE Right 04/23/2021   Procedure: OPEN REDUCTION INTERNAL FIXATION (ORIF) RIGHT ANKLE FRACTURE;  Surgeon: Nadara Mustard, MD;  Location[facility];  Service: Orthopedics;  Laterality: Right;    Current Outpatient Medications  Medication Sig Dispense Refill   fenofibrate (TRICOR) 145 MG tablet Take 145 mg by mouth daily.     Semaglutide-Weight Management (WEGOVY) 1.7 MG/0.75ML SOAJ      sertraline (ZOLOFT) 50 MG tablet Take 50 mg by mouth daily.     triamterene-hydrochlorothiazide (MAXZIDE-25) 37.5-25 MG tablet Take 1 tablet by mouth daily. 30 tablet 0   No current facility-administered medications for this visit.    Family History  Problem Relation Age of Onset   Hypertension Mother    Hyperlipidemia Mother    Depression Mother    Obesity Mother    Hypertension Father     Heart attack Father    Obesity Father    Hyperlipidemia Father    Heart disease Father    Sudden death Daughter    Colon cancer Maternal Grandmother     ROS: Constitutional: negative Genitourinary:negative  Exam:   BP 116/80   Pulse 72   Ht 5\' 4"  (1.626 m)   Wt 203 lb (92.1 kg)   LMP 09/22/2003   BMI 34.84 kg/m   Height: 5\' 4"  (162.6 cm)  General appearance: alert, cooperative and appears stated age Head: Normocephalic, without obvious abnormality, atraumatic Neck: no adenopathy, supple, symmetrical, trachea midline and thyroid normal to inspection and palpation Lungs: clear to auscultation bilaterally Breasts: normal appearance, no masses or tenderness Heart: regular rate and rhythm Abdomen: soft, non-tender; bowel sounds normal; no masses,  no organomegaly Extremities: extremities normal, atraumatic, no cyanosis or edema Skin: Skin color, texture, turgor normal. No rashes or lesions Lymph nodes: Cervical, supraclavicular, and axillary nodes normal. No abnormal inguinal nodes palpated Neurologic: Grossly normal   Pelvic: External genitalia:  no lesions              Urethra:  normal appearing urethra with no masses, tenderness or lesions              Bartholins and Skenes: normal  Vagina: normal appearing vagina with normal color and no discharge, no lesions              Cervix: no lesions              Pap taken: No. Bimanual Exam:  Uterus:  normal size, contour, position, consistency, mobility, non-tender              Adnexa: normal adnexa and no mass, fullness, tenderness               Rectovaginal: Confirms               Anus:  normal sphincter tone, no lesions  Chaperone, Ina Homes, CMA, was present for exam.  Assessment/Plan: 1. Well woman exam with routine gynecological exam - Pap smear 2022.  Will repeat next year - Mammogram 01/2022.  Pt aware this is due. - Colonoscopy 2023. - Bone mineral density 2023 - lab work done with PCP, Dr.  Louis Meckel - vaccines reviewed/updated  2. Postmenopausal - not on HRT  3. Essential hypertension  4. Other hyperlipidemia  5. Vitamin D deficiency  6. Vulvar itching - clobetasol ointment (TEMOVATE) 0.05 %; Apply 1 Application topically at bedtime. Do not use for more than 5 days in a row.  Dispense: 60 g; Refill: 0

## 2023-01-28 ENCOUNTER — Other Ambulatory Visit (HOSPITAL_BASED_OUTPATIENT_CLINIC_OR_DEPARTMENT_OTHER): Payer: Self-pay

## 2023-01-28 DIAGNOSIS — Z1231 Encounter for screening mammogram for malignant neoplasm of breast: Secondary | ICD-10-CM

## 2023-01-29 ENCOUNTER — Other Ambulatory Visit (HOSPITAL_BASED_OUTPATIENT_CLINIC_OR_DEPARTMENT_OTHER): Payer: Self-pay

## 2023-01-29 ENCOUNTER — Encounter (HOSPITAL_BASED_OUTPATIENT_CLINIC_OR_DEPARTMENT_OTHER): Payer: Self-pay | Admitting: Obstetrics & Gynecology

## 2023-01-29 ENCOUNTER — Ambulatory Visit (HOSPITAL_BASED_OUTPATIENT_CLINIC_OR_DEPARTMENT_OTHER)
Admission: RE | Admit: 2023-01-29 | Discharge: 2023-01-29 | Disposition: A | Discharge status: Home / Self Care | Payer: Managed Care, Other (non HMO) | Source: Ambulatory Visit | Attending: Obstetrics & Gynecology | Admitting: Obstetrics & Gynecology

## 2023-01-29 DIAGNOSIS — Z1231 Encounter for screening mammogram for malignant neoplasm of breast: Secondary | ICD-10-CM | POA: Insufficient documentation

## 2023-02-23 ENCOUNTER — Other Ambulatory Visit: Payer: Self-pay | Admitting: Obstetrics & Gynecology

## 2023-02-23 DIAGNOSIS — R928 Other abnormal and inconclusive findings on diagnostic imaging of breast: Secondary | ICD-10-CM

## 2023-03-08 ENCOUNTER — Ambulatory Visit
Admission: RE | Admit: 2023-03-08 | Discharge: 2023-03-08 | Disposition: A | Discharge status: Home / Self Care | Payer: Managed Care, Other (non HMO) | Source: Ambulatory Visit | Attending: Obstetrics & Gynecology | Admitting: Obstetrics & Gynecology

## 2023-03-08 DIAGNOSIS — R928 Other abnormal and inconclusive findings on diagnostic imaging of breast: Secondary | ICD-10-CM

## 2023-04-12 ENCOUNTER — Other Ambulatory Visit: Payer: Self-pay | Admitting: Obstetrics & Gynecology

## 2023-04-12 DIAGNOSIS — N6320 Unspecified lump in the left breast, unspecified quadrant: Secondary | ICD-10-CM

## 2023-09-08 ENCOUNTER — Other Ambulatory Visit: Payer: Managed Care, Other (non HMO)

## 2023-09-27 ENCOUNTER — Other Ambulatory Visit: Payer: Managed Care, Other (non HMO)

## 2023-10-06 ENCOUNTER — Ambulatory Visit
Admission: RE | Admit: 2023-10-06 | Discharge: 2023-10-06 | Disposition: A | Discharge status: Home / Self Care | Payer: Managed Care, Other (non HMO) | Source: Ambulatory Visit | Attending: Obstetrics & Gynecology | Admitting: Obstetrics & Gynecology

## 2023-10-06 DIAGNOSIS — N6320 Unspecified lump in the left breast, unspecified quadrant: Secondary | ICD-10-CM

## 2024-01-28 ENCOUNTER — Other Ambulatory Visit (HOSPITAL_BASED_OUTPATIENT_CLINIC_OR_DEPARTMENT_OTHER): Payer: Self-pay

## 2024-01-28 ENCOUNTER — Other Ambulatory Visit (HOSPITAL_COMMUNITY)
Admission: RE | Admit: 2024-01-28 | Discharge: 2024-01-28 | Disposition: A | Discharge status: Home / Self Care | Source: Ambulatory Visit | Attending: Obstetrics & Gynecology | Admitting: Obstetrics & Gynecology

## 2024-01-28 ENCOUNTER — Ambulatory Visit (HOSPITAL_BASED_OUTPATIENT_CLINIC_OR_DEPARTMENT_OTHER): Payer: Managed Care, Other (non HMO) | Admitting: Obstetrics & Gynecology

## 2024-01-28 ENCOUNTER — Encounter (HOSPITAL_BASED_OUTPATIENT_CLINIC_OR_DEPARTMENT_OTHER): Payer: Self-pay | Admitting: Obstetrics & Gynecology

## 2024-01-28 VITALS — BP 130/70 | HR 80 | Ht 64.0 in | Wt 225.0 lb

## 2024-01-28 DIAGNOSIS — I10 Essential (primary) hypertension: Secondary | ICD-10-CM

## 2024-01-28 DIAGNOSIS — D219 Benign neoplasm of connective and other soft tissue, unspecified: Secondary | ICD-10-CM | POA: Diagnosis not present

## 2024-01-28 DIAGNOSIS — Z01419 Encounter for gynecological examination (general) (routine) without abnormal findings: Secondary | ICD-10-CM

## 2024-01-28 DIAGNOSIS — Z124 Encounter for screening for malignant neoplasm of cervix: Secondary | ICD-10-CM | POA: Diagnosis present

## 2024-01-28 DIAGNOSIS — N393 Stress incontinence (female) (male): Secondary | ICD-10-CM

## 2024-01-28 DIAGNOSIS — E7849 Other hyperlipidemia: Secondary | ICD-10-CM

## 2024-01-28 DIAGNOSIS — L292 Pruritus vulvae: Secondary | ICD-10-CM

## 2024-01-28 MED ORDER — CLOBETASOL PROPIONATE 0.05 % EX OINT
1.0000 | TOPICAL_OINTMENT | Freq: Two times a day (BID) | CUTANEOUS | 0 refills | Status: DC
  Filled 2024-01-28: qty 60, 30d supply, fill #0

## 2024-01-28 NOTE — Progress Notes (Signed)
 ANNUAL EXAM Patient name: Beth Rivers MRN [id number]  Date of birth: [birth year] Chief Complaint:   Annual Exam  History of Present Illness:   Soumya Potempa is a 68 y.o. G61P2001 Caucasian female being seen today for a routine annual exam.  Denied vaginal bleeding.  Does have some urinary incontinence with laughing and sneezing.  Does not wear a pad.     Having a lot more vulvar itching.  Gave   Has started some allergy testing that she had but provider retired/left.    PCP, Dr. Mat Solian at [facility].    Patient's last menstrual period was 09/22/2003.   Last pap 11/2020. Results were: NILM w/ HRHPV negative.  Last mammogram: 09/2023. Results were: normal.  Needs 6 month follow up.  Family h/o breast cancer: no Last colonoscopy: 02/2022. Results were: normal. F/u 10 years.  Family h/o colorectal cancer: yes MGM     01/28/2024    8:45 AM 01/22/2023    8:10 AM 01/06/2022    1:43 PM 07/27/2018   11:04 AM 07/11/2018    8:47 AM  Depression screen PHQ 2/9  Decreased Interest 0 0 0 0 3  Down, Depressed, Hopeless 0 0 0 0 2  PHQ - 2 Score 0 0 0 0 5  Altered sleeping    2 3  Tired, decreased energy    1 3  Change in appetite    0 3  Feeling bad or failure about yourself     0 2  Trouble concentrating    0 0  Moving slowly or fidgety/restless    0 0  Suicidal thoughts    0 0  PHQ-9 Score    3 16  Difficult doing work/chores     Not difficult at all     Review of Systems:   Pertinent items are noted in HPI  Denies any urinary or bowel changes.  Pertinent History Reviewed:  Reviewed past medical,surgical, social and family history.  Reviewed problem list, medications and allergies. Physical Assessment:   Vitals:   01/28/24 0842  BP: 130/70  Pulse: 80  Weight: 225 lb (102.1 kg)  Height: 5\' 4"  (1.626 m)  Body mass index is 38.62 kg/m.        Physical Examination:   General appearance - well appearing, and in no distress  Mental status - alert, oriented to  person, place, and time  Psych:  She has a normal mood and affect  Skin - warm and dry, normal color, no suspicious lesions noted  Chest - effort normal, all lung fields clear to auscultation bilaterally  Heart - normal rate and regular rhythm  Neck:  midline trachea, no thyromegaly or nodules  Breasts - breasts appear normal, no suspicious masses, no skin or nipple changes or  axillary nodes  Abdomen - soft, nontender, nondistended, no masses or organomegaly  Pelvic - VULVA: area on left labia majora with skin excoriation, tenderness or lesions   VAGINA: normal appearing vagina with normal color and discharge, no lesions  CERVIX: normal appearing cervix without discharge or lesions, no CMT  Thin prep pap is done   UTERUS: uterus is felt to be normal size, shape, consistency and nontender   ADNEXA: No adnexal masses or tenderness noted.  Rectal - normal rectal, good sphincter tone, no masses felt.   Extremities:  No swelling or varicosities noted  Chaperone present for exam Assessment & Plan:  1. Well woman exam with routine gynecological exam (Primary) - Pap  smear updated today - Mammogram done Jan, follow up due this month - Colonoscopy 2023.  Follow up 10 years.  - Bone mineral density 2023 - lab work done with PCP - vaccines reviewed/updated  2. Essential hypertension - on maxzide  3. Fibroids  4. Other hyperlipidemia   5. Stress incontinence - Ambulatory referral to Urogynecology  6. Vulvar itching - rx for clobetasol  0.05% ointment bid.  Recheck 3 - 4 weeks.     Meds: No orders of the defined types were placed in this encounter.   Follow-up: Return in about 1 year (around 01/27/2025).  Lillian Rein, MD 01/28/2024 9:27 AM

## 2024-01-28 NOTE — Addendum Note (Signed)
 Addended by: Lillian Rein on: 01/28/2024 09:51 AM   Modules accepted: Orders

## 2024-01-31 LAB — CYTOLOGY - PAP
Adequacy: ABSENT
Diagnosis: NEGATIVE

## 2024-02-15 ENCOUNTER — Ambulatory Visit (HOSPITAL_BASED_OUTPATIENT_CLINIC_OR_DEPARTMENT_OTHER): Payer: Self-pay | Admitting: Obstetrics & Gynecology

## 2024-02-18 ENCOUNTER — Ambulatory Visit (HOSPITAL_BASED_OUTPATIENT_CLINIC_OR_DEPARTMENT_OTHER): Admitting: Obstetrics & Gynecology

## 2024-02-18 ENCOUNTER — Encounter (HOSPITAL_BASED_OUTPATIENT_CLINIC_OR_DEPARTMENT_OTHER): Payer: Self-pay | Admitting: Obstetrics & Gynecology

## 2024-02-18 VITALS — BP 130/82 | HR 69 | Ht 64.0 in | Wt 227.4 lb

## 2024-02-18 DIAGNOSIS — L292 Pruritus vulvae: Secondary | ICD-10-CM

## 2024-02-18 DIAGNOSIS — Z1231 Encounter for screening mammogram for malignant neoplasm of breast: Secondary | ICD-10-CM

## 2024-02-18 NOTE — Progress Notes (Signed)
 GYNECOLOGY  VISIT  CC:   vulvar recheck  HPI: 68 y.o. G76P2001 Married White or Caucasian female here for recheck after being seen on 01/28/2024 with complaints of vulvar itching and physical exam c/w chronic itching.  Started on clobetasol  and pt reports within 1-2 uses, symptoms were much, much improved.  Still using daily but has missed some applications.  Now, not having any itching.  Possible causes including toilet paper, soaps, mini pads discussed.    Unrelated, needs MMG order for scheduling at [facility].   Past Medical History:  Diagnosis Date   Anxiety    Arthritis    Depression 09/2001   Elevated BP    Elevated LDL cholesterol level 09/2012   Feeling lonely    History of uterine fibroid 11/2002   HTN (hypertension)    Joint pain    Leg edema    Obesity    Obstructive sleep apnea     MEDS:   Current Outpatient Medications on File Prior to Visit  Medication Sig Dispense Refill   clobetasol  ointment (TEMOVATE ) 0.05 % Apply as directed topically twice daily 60 g 0   fenofibrate (TRICOR) 145 MG tablet Take 145 mg by mouth daily.     sertraline  (ZOLOFT ) 50 MG tablet Take 50 mg by mouth daily.     tirzepatide (ZEPBOUND) 15 MG/0.5ML Pen Inject 15 mg into the skin once a week.     triamterene -hydrochlorothiazide (MAXZIDE-25) 37.5-25 MG tablet Take 1 tablet by mouth daily. 30 tablet 0   clobetasol  ointment (TEMOVATE ) 0.05 % Apply 1 Application topically at bedtime. Do not use for more than 5 days in a row. (Patient not taking: Reported on 02/18/2024) 60 g 0   No current facility-administered medications on file prior to visit.    ALLERGIES: Patient has no known allergies.  SH:  married, non smoker  Review of Systems  Constitutional: Negative.   Genitourinary: Negative.     PHYSICAL EXAMINATION:    BP 130/82   Pulse 69   Ht 5\' 4"  (1.626 m)   Wt 227 lb 6.4 oz (103.1 kg)   LMP 09/22/2003   BMI 39.03 kg/m     General appearance: alert, cooperative and appears stated  age  Lymph:  no inguinal LAD noted Pelvic: External genitalia:  complete resolution of skin changes, completely normal appearing vulvar appearance today            Assessment/Plan: 1. Vulvar itching (Primary) - pt will taper off topical steroid by using nightly for 1-2 weeks and then two to three times weekly for 1-2 weeks and then stop.  Will make changes to products she is using to see if can prevent this from returning.  If starts having new itching, she is going to call back and let me know.  2. Encounter for screening mammogram for malignant neoplasm of breast - MM 3D SCREENING MAMMOGRAM BILATERAL BREAST; Future

## 2024-02-22 ENCOUNTER — Other Ambulatory Visit (HOSPITAL_BASED_OUTPATIENT_CLINIC_OR_DEPARTMENT_OTHER): Payer: Self-pay | Admitting: Obstetrics & Gynecology

## 2024-02-22 DIAGNOSIS — Z1231 Encounter for screening mammogram for malignant neoplasm of breast: Secondary | ICD-10-CM

## 2024-02-25 ENCOUNTER — Encounter (HOSPITAL_BASED_OUTPATIENT_CLINIC_OR_DEPARTMENT_OTHER): Payer: Self-pay | Admitting: Radiology

## 2024-02-25 ENCOUNTER — Ambulatory Visit (HOSPITAL_BASED_OUTPATIENT_CLINIC_OR_DEPARTMENT_OTHER)
Admission: RE | Admit: 2024-02-25 | Discharge: 2024-02-25 | Disposition: A | Discharge status: Home / Self Care | Source: Ambulatory Visit

## 2024-02-25 DIAGNOSIS — Z1231 Encounter for screening mammogram for malignant neoplasm of breast: Secondary | ICD-10-CM | POA: Insufficient documentation

## 2024-05-16 ENCOUNTER — Ambulatory Visit: Admitting: Obstetrics

## 2024-06-01 ENCOUNTER — Ambulatory Visit: Admitting: Obstetrics

## 2024-06-01 ENCOUNTER — Encounter: Payer: Self-pay | Admitting: Obstetrics

## 2024-06-01 VITALS — BP 155/83 | HR 65 | Ht 62.21 in | Wt 235.0 lb

## 2024-06-01 DIAGNOSIS — E6609 Other obesity due to excess calories: Secondary | ICD-10-CM

## 2024-06-01 DIAGNOSIS — N3946 Mixed incontinence: Secondary | ICD-10-CM | POA: Diagnosis not present

## 2024-06-01 DIAGNOSIS — N8110 Cystocele, unspecified: Secondary | ICD-10-CM | POA: Diagnosis not present

## 2024-06-01 DIAGNOSIS — Z6835 Body mass index (BMI) 35.0-35.9, adult: Secondary | ICD-10-CM

## 2024-06-01 DIAGNOSIS — E66812 Obesity, class 2: Secondary | ICD-10-CM

## 2024-06-01 DIAGNOSIS — R3914 Feeling of incomplete bladder emptying: Secondary | ICD-10-CM

## 2024-06-01 LAB — POCT URINALYSIS DIP (CLINITEK)
Bilirubin, UA: NEGATIVE
Blood, UA: NEGATIVE
Glucose, UA: NEGATIVE mg/dL
Ketones, POC UA: NEGATIVE mg/dL
Leukocytes, UA: NEGATIVE
Nitrite, UA: NEGATIVE
POC PROTEIN,UA: NEGATIVE
Spec Grav, UA: 1.02 (ref 1.010–1.025)
Urobilinogen, UA: 0.2 U/dL
pH, UA: 7 (ref 5.0–8.0)

## 2024-06-01 MED ORDER — ESTRADIOL 0.1 MG/GM VA CREA: TOPICAL_CREAM | VAGINAL | 3 refills | Status: AC

## 2024-06-01 NOTE — Assessment & Plan Note (Addendum)
-   asymptomatic  - For treatment of pelvic organ prolapse, we discussed options for management including expectant management, conservative management, and surgical management, such as Kegels, a pessary, pelvic floor physical therapy, and specific surgical procedures. - encouraged trial of pessary fitting if improved emptying sensation with splinting - Discussed treatment of vaginal atrophy to improve comfort as the time of pessary fitting - We discussed the potential risks associated with hormone replacement including stroke, heart attack, and blood clots; and the fact that these risks are very low with vaginal estrogen use due to the very low systemic absorption rate of ~ 0.01% with a twice-week regimen.

## 2024-06-01 NOTE — Assessment & Plan Note (Signed)
-   insensible leakage for 6-9 months since weight gain, urgency more bothersome - stress leakage worsened with COVID - PVR , catheterized for - discussed need to ensure bladder emptying prior to intervention - impressa with relief - For treatment of stress urinary incontinence,  non-surgical options include expectant management, weight loss, physical therapy, as well as a pessary.  Surgical options include a midurethral sling, Burch urethropexy, and transurethral injection of a bulking agent. - return for incontinence pessary fitting and start low dose vaginal estrogen - We discussed the symptoms of overactive bladder (OAB), which include urinary urgency, urinary frequency, nocturia, with or without urge incontinence.  While we do not know the exact etiology of OAB, several treatment options exist. We discussed management including behavioral therapy (decreasing bladder irritants, urge suppression strategies, timed voids, bladder retraining), physical therapy, medication; for refractory cases posterior tibial nerve stimulation, sacral neuromodulation, and intravesical botulinum toxin injection.  For anticholinergic medications, we discussed the potential side effects of anticholinergics including dry eyes, dry mouth, constipation, cognitive impairment and urinary retention. For Beta-3 agonist medication, we discussed the potential side effect of elevated blood pressure which is more likely to occur in individuals with uncontrolled hypertension. - will postpone treatment of OAB until repeat PVR to ensure bladder emptying

## 2024-06-01 NOTE — Assessment & Plan Note (Signed)
-   POCT UA negative, PVR - catheterized for - encouraged splinting as needed to ensure complete emptying - repeat PVR at follow-up via catheterization to ensure bladder emptying

## 2024-06-01 NOTE — Progress Notes (Signed)
 New Patient Evaluation and Consultation  Referring Provider: Cleotilde Ronal RAMAN, MD PCP: Felisa Reece SQUIBB, MD Date of Service: 06/01/2024  SUBJECTIVE Chief Complaint: New Patient (Initial Visit) Bendall Bakken is a 68 y.o. female here today for urinary incontinence. )  History of Present Illness: Svea Pusch is a 68 y.o. White or Caucasian female seen in consultation at the request of Dr Cleotilde for evaluation of stress urinary incontinence.    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  Takes triamterene -hydrochlorothiazide when she wakes up at 7pm Zepbound stopped 3 weeks ago, previously on Wegovy Clobetasol   0.05% ointment BID for vulvar itching with improved symptoms. Left labia majora excoriation noted on 01/28/24 by Dr. Cleotilde. Biopsy 01/23/24 consistent with ingrown hair with fibrous scar.  Review of records significant for: H/o insulin  resistance with HbA1C 5.4 and Cr 0.7 in 02/18/24  Urinary Symptoms: Leaks urine with cough/ sneeze, laughing, exercise, lifting, with a full bladder, with movement to the bathroom, with urgency, and without sensation Leaks 2-3 time(s) per days with urgency at around 2-3pm, managed with timed voids Leaks 10-15x/day with coughing since COVID, baseline 3-5x/day Leaks 1x/day without sensation at work Pad use: 3 liners/ mini-pads per day with underwear changes Tried Impressa with resolution of leakage, however reports discomfort with descent and increased awareness attributed to the wrong size Patient is bothered by UI symptoms.  Day time voids 4.  Nocturia: 0 times per night to void with OSA Voiding dysfunction:  does not empty bladder well due to additional  urine passage when she moves on the commode Patient does not use a catheter to empty bladder.  When urinating, patient feels dribbling after finishing and to push on her belly or vagina to empty bladder Drinks: 64-80oz of [place] per day, 2 cappuccinos/day   UTIs: 0 UTI's in the last year.   Denies history of blood in urine, kidney or bladder stones, pyelonephritis, bladder cancer, and kidney cancer No results found for the last 90 days.   Pelvic Organ Prolapse Symptoms:                  Patient Denies a feeling of a bulge the vaginal area.  Bowel Symptom: Bowel movements: 1 time(s) per day Stool consistency: hard, Bristol III-IV stool Straining: no.  Splinting: no.  Incomplete evacuation: no.  Patient Denies accidental bowel leakage / fecal incontinence Bowel regimen: diet Last colonoscopy: Results diverticulosis [facility] Colonoscopy          Upcoming     Colonoscopy (Every 10 Years) Next due on 03/13/2032    03/13/2022  [facility] COLONOSCOPY   Only the first 1 history entries have been loaded, but more history exists.                Sexual Function Sexually active: yes.  Sexual orientation: Straight Pain with sex: No  Pelvic Pain Denies pelvic pain   Past Medical History:  Past Medical History:  Diagnosis Date   Anxiety    Arthritis    COVID    Depression 09/2001   Elevated BP    Elevated LDL cholesterol level 09/2012   Feeling lonely    History of uterine fibroid 11/2002  HTN (hypertension)    Joint pain    Leg edema    Obesity    Obstructive sleep apnea      Past Surgical History:   Past Surgical History:  Procedure Laterality Date   BELPHAROPTOSIS REPAIR     COLONOSCOPY     ORIF ANKLE FRACTURE Right 04/23/2021   Procedure: OPEN REDUCTION INTERNAL FIXATION (ORIF) RIGHT ANKLE FRACTURE;  Surgeon: Harden Jerona GAILS, MD;  Location: [facility] OR;  Service: Orthopedics;  Laterality: Right;     Past OB/GYN History: OB History  Gravida Para Term Preterm AB  Living  3 2 2  1 1   SAB IAB Ectopic Multiple Live Births      1    # Outcome Date GA Lbr Len/2nd Weight Sex Type Anes PTL Lv  3 Term 01/1987    F Vag-Spont     2 Term 12/1983    F Vag-Spont   LIV  1 AB             Obstetric Comments  One daughter passed    Vaginal deliveries: largest infant 7.5oz, episiotomy repaired. Forceps/ Vacuum deliveries: 0, Cesarean section: 0 Menopausal: Yes, at age 61, Denies vaginal bleeding since menopause Contraception: s/p menopause. Last pap smear.  Any history of abnormal pap smears: no.    Component Value Date/Time   DIAGPAP  01/28/2024 0951    - Negative for intraepithelial lesion or malignancy (NILM)   DIAGPAP  12/19/2020 1015    - Negative for intraepithelial lesion or malignancy (NILM)   DIAGPAP  05/31/2018 0000    NEGATIVE FOR INTRAEPITHELIAL LESIONS OR MALIGNANCY.   HPVHIGH Negative 12/19/2020 1015   ADEQPAP  01/28/2024 0951    Satisfactory for evaluation; transformation zone component ABSENT.   ADEQPAP  12/19/2020 1015    Satisfactory for evaluation; transformation zone component ABSENT.   ADEQPAP  05/31/2018 0000    Satisfactory for evaluation  endocervical/transformation zone component ABSENT.    Medications: Patient has a current medication list which includes the following prescription(s): airsupra, clobetasol  ointment, [START ON 06/05/2024] estradiol , fenofibrate, folic acid , paxlovid (300/100), sertraline , zepbound, triamterene -hydrochlorothiazide, and clobetasol  ointment.   Allergies: Patient has no known allergies.   Social History:  Social History   Tobacco Use   Smoking status: Never   Smokeless tobacco: Never  Vaping Use   Vaping status: Never Used  Substance Use Topics   Alcohol use: Yes    Comment: occ   Drug use: No    Relationship status: married Patient lives with her husband.   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 Regular exercise: No History of abuse: No  Family History:   Family History   Problem Relation Age of Onset   Hypertension Mother    Hyperlipidemia Mother    Depression Mother    Obesity Mother    Hypertension Father    Heart attack Father    Obesity Father    Hyperlipidemia Father    Heart disease Father    Colon cancer Maternal Grandmother    Sudden death Daughter    Bladder Cancer Neg Hx    Uterine cancer Neg Hx    Renal cancer Neg Hx      Review of Systems: Review of Systems  Constitutional:  Positive for malaise/fatigue. Negative for fever and weight loss.       Weight gain  Respiratory:  Negative for cough, shortness of breath and wheezing.   Cardiovascular:  Negative for chest pain, palpitations and leg swelling.  Gastrointestinal:  Negative for abdominal pain, blood in stool and constipation.  Genitourinary:  Positive for urgency. Negative for dysuria, frequency and hematuria.       Leakage  Skin:  Negative for rash.  Neurological:  Negative for dizziness, weakness and headaches.  Endo/Heme/Allergies:  Does not bruise/bleed easily.  Psychiatric/Behavioral:  Negative for depression. The patient is not nervous/anxious.      OBJECTIVE Physical Exam: Vitals:   06/01/24 1413 06/01/24 1520  BP: (!) 154/81 (!) 155/83  Pulse: 72 65  Weight: 235 lb (106.6 kg)   Height: 5' 2.21 (1.58 m)     Physical Exam Constitutional:      General: She is not in acute distress.    Appearance: Normal appearance.  Genitourinary:     Bladder and urethral meatus normal.     No lesions in the vagina.     Genitourinary Comments: Limited bimanual exam due to habitus     Right Labia: No rash, tenderness, lesions, skin changes or Bartholin's cyst.    Left Labia: No tenderness, lesions, skin changes, Bartholin's cyst or rash.    No vaginal discharge, erythema, tenderness, bleeding, ulceration or granulation tissue.     Anterior vaginal prolapse present.    Mild vaginal atrophy present.     Right Adnexa: not tender, not full and no mass present.    Left  Adnexa: not tender, not full and no mass present.    No cervical motion tenderness, discharge, friability, lesion, polyp or nabothian cyst.     Uterus is not enlarged, fixed, tender or irregular.     No uterine mass detected.    Urethral meatus caruncle not present.    Urethral stress urinary incontinence with cough stress test present.     No urethral prolapse, tenderness, mass, hypermobility or discharge present.     Bladder is not tender, urgency on palpation not present and masses not present.      Pelvic Floor: Levator muscle strength is 4/5.    Levator ani not tender, obturator internus not tender, no asymmetrical contractions present and no pelvic spasms present.    Symmetrical pelvic sensation, anal wink present and BC reflex present. Cardiovascular:     Rate and Rhythm: Normal rate.  Pulmonary:     Effort: Pulmonary effort is normal. No respiratory distress.  Abdominal:     General: There is no distension.     Palpations: Abdomen is soft. There is no mass.     Tenderness: There is no abdominal tenderness.     Hernia: No hernia is present.  Neurological:     Mental Status: She is alert.  Vitals reviewed. Exam conducted with a chaperone present.      POP-Q:   POP-Q  -2                                            Aa   -2                                           Ba  -7  C   3                                            Gh  3                                            Pb  10                                            tvl   -3                                            Ap  -3                                            Bp  -9                                              D    Post-Void Residual (PVR) by Bladder Scan: In order to evaluate bladder emptying, we discussed obtaining a postvoid residual and patient agreed to this procedure.  Procedure: The ultrasound unit was placed on the patient's abdomen in the  suprapubic region after the patient had voided.    Post Void Residual - 06/01/24 1445       Post Void Residual   Post Void Residual 108 mL         Straight Catheterization Procedure: After verbal consent was obtained from the patient for catheterization to assess bladder emptying and residual volume the urethra and surrounding tissues were prepped with betadine and an in and out catheterization was performed.  PVR was .  Urine appeared clear yellow. The patient tolerated the procedure well.   Laboratory Results: Lab Results  Component Value Date   COLORU yellow (A) 06/01/2024   CLARITYU clear 06/01/2024   GLUCOSEUR negative 06/01/2024   BILIRUBINUR negative 06/01/2024   KETONESU neg 07/30/2021   SPECGRAV 1.020 06/01/2024   RBCUR negative 06/01/2024   PHUR 7.0 06/01/2024   PROTEINUR Negative 07/30/2021   UROBILINOGEN 0.2 06/01/2024   LEUKOCYTESUR Negative 06/01/2024    Lab Results  Component Value Date   CREATININE 0.70 04/23/2021   CREATININE 0.67 05/01/2020   CREATININE 0.67 01/09/2020    Lab Results  Component Value Date   HGBA1C 5.2 05/01/2020    Lab Results  Component Value Date   HGB 15.4 (H) 04/23/2021     ASSESSMENT AND PLAN Ms. Zaragoza is a 68 y.o. with:  1. Urinary incontinence, mixed   2. Class 2 obesity due to excess calories without serious comorbidity with body mass index (BMI) of 35.0 to 35.9 in adult   3. Feeling of incomplete bladder emptying   4. Pelvic organ prolapse quantification stage 1 cystocele     Urinary incontinence,  mixed Assessment & Plan: - insensible leakage for 6-9 months since weight gain, urgency more bothersome - stress leakage worsened with COVID - PVR , catheterized for - discussed need to ensure bladder emptying prior to intervention - impressa with relief - For treatment of stress urinary incontinence,  non-surgical options include expectant management, weight loss, physical therapy, as well as a  pessary.  Surgical options include a midurethral sling, Burch urethropexy, and transurethral injection of a bulking agent. - return for incontinence pessary fitting and start low dose vaginal estrogen - We discussed the symptoms of overactive bladder (OAB), which include urinary urgency, urinary frequency, nocturia, with or without urge incontinence.  While we do not know the exact etiology of OAB, several treatment options exist. We discussed management including behavioral therapy (decreasing bladder irritants, urge suppression strategies, timed voids, bladder retraining), physical therapy, medication; for refractory cases posterior tibial nerve stimulation, sacral neuromodulation, and intravesical botulinum toxin injection.  For anticholinergic medications, we discussed the potential side effects of anticholinergics including dry eyes, dry mouth, constipation, cognitive impairment and urinary retention. For Beta-3 agonist medication, we discussed the potential side effect of elevated blood pressure which is more likely to occur in individuals with uncontrolled hypertension. - will postpone treatment of OAB until repeat PVR to ensure bladder emptying  Orders: -     POCT URINALYSIS DIP (CLINITEK) -     Estradiol ; Place 0.5g nightly for two weeks then twice a week after  Dispense: 30 g; Refill: 3 -     Hemoglobin A1c; Future  Class 2 obesity due to excess calories without serious comorbidity with body mass index (BMI) of 35.0 to 35.9 in adult Assessment & Plan: - insensible leakage started with 35lbs 6-9 months ago - no pooling of fluid noted at vaginal vault - discussed association with pelvic floor disorders and encouraged weight reduction - encouraged diet modification and increase activity level after recovery from COVID and resumption of Zepbound  Orders: -     Hemoglobin A1c; Future  Feeling of incomplete bladder emptying Assessment & Plan: - POCT UA negative, PVR - catheterized  for - encouraged splinting as needed to ensure complete emptying - repeat PVR at follow-up via catheterization to ensure bladder emptying  Orders: -     Hemoglobin A1c; Future  Pelvic organ prolapse quantification stage 1 cystocele Assessment & Plan: - asymptomatic  - For treatment of pelvic organ prolapse, we discussed options for management including expectant management, conservative management, and surgical management, such as Kegels, a pessary, pelvic floor physical therapy, and specific surgical procedures. - encouraged trial of pessary fitting if improved emptying sensation with splinting - Discussed treatment of vaginal atrophy to improve comfort as the time of pessary fitting - We discussed the potential risks associated with hormone replacement including stroke, heart attack, and blood clots; and the fact that these risks are very low with vaginal estrogen use due to the very low systemic absorption rate of ~ 0.01% with a twice-week regimen.   Time spent: I spent 60 minutes dedicated to the care of this patient on the date of this encounter to include pre-visit review of records, face-to-face time with the patient discussing mixed urinary incontinence, sensation of incomplete bladder emptying, BMI, stage I pelvic organ prolapse, and post visit documentation and ordering medication/ testing.   Lianne ONEIDA Gillis, MD

## 2024-06-01 NOTE — Assessment & Plan Note (Signed)
-   insensible leakage started with 35lbs 6-9 months ago - no pooling of fluid noted at vaginal vault - discussed association with pelvic floor disorders and encouraged weight reduction - encouraged diet modification and increase activity level after recovery from COVID and resumption of Zepbound

## 2024-06-01 NOTE — Patient Instructions (Addendum)
 For treatment of stress urinary incontinence, which is leakage with physical activity/movement/strainging/coughing, we discussed expectant management versus nonsurgical options versus surgery. Nonsurgical options include weight loss, physical therapy, as well as a pessary.  Surgical options include a midurethral sling, which is a synthetic mesh sling that acts like a hammock under the urethra to prevent leakage of urine, a Burch urethropexy, and transurethral injection of a bulking agent.   We discussed an office procedure with urethral bulking (Bulkamid). We discussed success rate of approximately 70-80% and possible need for second injection. We reviewed that this is not a permanent procedure and the Bulkamid does become less effective over time. Risks reviewed including injury to bladder or urethra, UTI, urinary retention and hematuria.   For pessary use: - discussed risk of change in urinary or bowel symptoms, vaginal ulceration, discharge, bleeding, fistula formation. You may require multiple sizes and types for fitting.   We discussed the symptoms of overactive bladder (OAB), which include urinary urgency, urinary frequency, night-time urination, with or without urge incontinence.  We discussed management including behavioral therapy (decreasing bladder irritants by following a bladder diet, urge suppression strategies, timed voids, bladder retraining), physical therapy, medication; and for refractory cases posterior tibial nerve stimulation, sacral neuromodulation, and intravesical botulinum toxin injection.   For vaginal atrophy (thinning of the vaginal tissue that can cause dryness and burning) and UTI prevention we discussed estrogen replacement in the form of vaginal cream.   Start vaginal estrogen therapy nightly for two weeks then 2 times weekly at night. This can be placed with your finger or an applicator inside the vagina and around the urethra.  Please let us  know if the prescription is  too expensive and we can look for alternative options.   Is vaginal estrogen therapy safe for me? Vaginal estrogen preparations act on the vaginal skin, and only a very tiny amount is absorbed into the bloodstream (0.01%).  They work in a similar way to hand or face cream.  There is minimal absorption and they are therefore perfectly safe. If you have had breast cancer and have persistent troublesome symptoms which aren't settling with vaginal moisturisers and lubricants, local estrogen treatment may be a possibility, but consultation with your oncologist should take place first.

## 2024-07-13 ENCOUNTER — Encounter: Payer: Self-pay | Admitting: Obstetrics and Gynecology

## 2024-07-13 ENCOUNTER — Ambulatory Visit: Admitting: Obstetrics and Gynecology

## 2024-07-13 VITALS — BP 126/81 | HR 78 | Wt 217.0 lb

## 2024-07-13 DIAGNOSIS — N393 Stress incontinence (female) (male): Secondary | ICD-10-CM | POA: Diagnosis not present

## 2024-07-13 DIAGNOSIS — N3946 Mixed incontinence: Secondary | ICD-10-CM

## 2024-07-13 DIAGNOSIS — N8110 Cystocele, unspecified: Secondary | ICD-10-CM | POA: Diagnosis not present

## 2024-07-13 DIAGNOSIS — Z466 Encounter for fitting and adjustment of urinary device: Secondary | ICD-10-CM | POA: Diagnosis not present

## 2024-07-13 DIAGNOSIS — R3914 Feeling of incomplete bladder emptying: Secondary | ICD-10-CM

## 2024-07-13 NOTE — Progress Notes (Signed)
 [facility]   Subjective:     Chief Complaint: pessary fitting  (Repeat PVR )  History of Present Illness: Beth Rivers is a 68 y.o. female with stress incontinence who presents today for a pessary fitting.    Past Medical History: Patient  has a past medical history of Anxiety, Arthritis, COVID, Depression (09/2001), Elevated BP, Elevated LDL cholesterol level (09/2012), Feeling lonely, History of uterine fibroid (11/2002), HTN (hypertension), Joint pain, Leg edema, Obesity, and Obstructive sleep apnea.   Past Surgical History: She  has a past surgical history that includes Blepharoptosis repair; Colonoscopy; and ORIF ankle fracture (Right, 04/23/2021).   Medications: She has a current medication list which includes the following prescription(s): airsupra, estradiol , fenofibrate, folic acid , paxlovid (300/100), sertraline , zepbound, and triamterene -hydrochlorothiazide.   Allergies: Patient has no known allergies.   Social History: Patient  reports that she has never smoked. She has never used smokeless tobacco. She reports current alcohol use. She reports that she does not use drugs.      Objective:    BP 126/81   Pulse 78   Wt 217 lb (98.4 kg)   LMP 09/22/2003   BMI 39.43 kg/m  Gen: No apparent distress, A&O x 3. Pelvic Exam: Normal external female genitalia; Bartholin's and Skene's glands normal in appearance; urethral meatus normal in appearance, no urethral masses or discharge.   A size #3 incontinence dish pessary (Lot [id number]) was fitted. It was comfortable, stayed in place with valsalva and was an appropriate size on examination, with one finger fitting between the pessary and the vaginal walls. Patient attempted to remove pessary and reports she is unable to but believes her husband will help her remove and replace and she would like to try at home in a more comfortable setting.  POP-Q (06/01/24)   -2                                            Aa    -2                                           Ba   -7                                              C    3                                            Gh   3                                            Pb   10                                            tvl    -3  Ap   -3                                            Bp   -9                                        Assessment/Plan:    Assessment: Beth Rivers is a 68 y.o. with stage I pelvic organ prolapse and stress incontinence who presents for a pessary fitting. Plan: She was fitted with a #3 incontinence dish pessary. She will remove the pessary once a week. She will use lubricant.   Patient to call with any concerns or if she is having pain or irritation.   Follow-up in 6 weeks for a pessary check or sooner as needed.  All questions were answered.    Krishon Adkison G Capricia Serda, NP

## 2024-08-24 ENCOUNTER — Ambulatory Visit: Admitting: Obstetrics and Gynecology

## 2024-09-28 ENCOUNTER — Ambulatory Visit: Admitting: Obstetrics and Gynecology

## 2024-10-02 ENCOUNTER — Encounter: Payer: Self-pay | Admitting: *Deleted

## 2025-02-02 ENCOUNTER — Ambulatory Visit (HOSPITAL_BASED_OUTPATIENT_CLINIC_OR_DEPARTMENT_OTHER): Admitting: Obstetrics & Gynecology
